# Patient Record
Sex: Female | Born: 1985 | Race: White | Hispanic: No | Marital: Single
Health system: Southern US, Community
[De-identification: ages and names within clinical notes are randomized; demographics above are authoritative.]

## PROBLEM LIST (undated history)

## (undated) DIAGNOSIS — F410 Panic disorder [episodic paroxysmal anxiety] without agoraphobia: Secondary | ICD-10-CM

## (undated) DIAGNOSIS — F419 Anxiety disorder, unspecified: Secondary | ICD-10-CM

## (undated) DIAGNOSIS — F319 Bipolar disorder, unspecified: Secondary | ICD-10-CM

## (undated) DIAGNOSIS — J45909 Unspecified asthma, uncomplicated: Secondary | ICD-10-CM

## (undated) DIAGNOSIS — F209 Schizophrenia, unspecified: Secondary | ICD-10-CM

---
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
## 2007-12-24 ENCOUNTER — Emergency Department (HOSPITAL_COMMUNITY): Admission: EM | Admit: 2007-12-24 | Discharge: 2007-12-24 | Payer: Self-pay | Admitting: Emergency Medicine

## 2008-01-12 ENCOUNTER — Emergency Department (HOSPITAL_COMMUNITY): Admission: EM | Admit: 2008-01-12 | Discharge: 2008-01-12 | Payer: Self-pay | Admitting: Family Medicine

## 2008-02-22 ENCOUNTER — Emergency Department (HOSPITAL_COMMUNITY): Admission: EM | Admit: 2008-02-22 | Discharge: 2008-02-22 | Payer: Self-pay | Admitting: Emergency Medicine

## 2008-02-24 ENCOUNTER — Emergency Department (HOSPITAL_COMMUNITY): Admission: EM | Admit: 2008-02-24 | Discharge: 2008-02-24 | Payer: Self-pay | Admitting: Emergency Medicine

## 2008-06-26 ENCOUNTER — Emergency Department (HOSPITAL_COMMUNITY): Admission: EM | Admit: 2008-06-26 | Discharge: 2008-06-26 | Payer: Self-pay | Admitting: Emergency Medicine

## 2008-06-29 ENCOUNTER — Ambulatory Visit (HOSPITAL_COMMUNITY): Admission: RE | Admit: 2008-06-29 | Discharge: 2008-06-29 | Payer: Self-pay | Admitting: General Surgery

## 2009-05-08 ENCOUNTER — Inpatient Hospital Stay (HOSPITAL_COMMUNITY): Admission: AD | Admit: 2009-05-08 | Discharge: 2009-05-08 | Payer: Self-pay | Admitting: Obstetrics & Gynecology

## 2009-05-19 ENCOUNTER — Emergency Department (HOSPITAL_COMMUNITY): Admission: EM | Admit: 2009-05-19 | Discharge: 2009-05-19 | Payer: Self-pay | Admitting: Emergency Medicine

## 2009-07-11 ENCOUNTER — Ambulatory Visit: Payer: Self-pay | Admitting: Obstetrics & Gynecology

## 2009-07-11 ENCOUNTER — Encounter: Payer: Self-pay | Admitting: Obstetrics and Gynecology

## 2009-07-11 LAB — CONVERTED CEMR LAB: GC Probe Amp, Genital: NEGATIVE

## 2009-07-12 ENCOUNTER — Ambulatory Visit (HOSPITAL_COMMUNITY): Admission: RE | Admit: 2009-07-12 | Discharge: 2009-07-12 | Payer: Self-pay | Admitting: Obstetrics & Gynecology

## 2009-07-12 ENCOUNTER — Encounter: Payer: Self-pay | Admitting: Obstetrics and Gynecology

## 2009-07-25 ENCOUNTER — Ambulatory Visit: Payer: Self-pay | Admitting: Obstetrics & Gynecology

## 2009-08-08 ENCOUNTER — Ambulatory Visit: Payer: Self-pay | Admitting: Obstetrics and Gynecology

## 2009-08-10 ENCOUNTER — Ambulatory Visit: Payer: Self-pay | Admitting: Advanced Practice Midwife

## 2009-08-10 ENCOUNTER — Inpatient Hospital Stay (HOSPITAL_COMMUNITY): Admission: AD | Admit: 2009-08-10 | Discharge: 2009-08-12 | Payer: Self-pay | Admitting: Obstetrics & Gynecology

## 2009-09-08 ENCOUNTER — Observation Stay (HOSPITAL_COMMUNITY): Admission: EM | Admit: 2009-09-08 | Discharge: 2009-09-10 | Payer: Self-pay | Admitting: Emergency Medicine

## 2009-09-09 ENCOUNTER — Ambulatory Visit: Payer: Self-pay | Admitting: Psychiatry

## 2010-02-27 ENCOUNTER — Inpatient Hospital Stay (HOSPITAL_COMMUNITY): Admission: AD | Admit: 2010-02-27 | Discharge: 2010-02-27 | Payer: Self-pay | Admitting: Obstetrics and Gynecology

## 2010-03-13 ENCOUNTER — Emergency Department (HOSPITAL_COMMUNITY): Admission: EM | Admit: 2010-03-13 | Discharge: 2010-03-13 | Payer: Self-pay | Admitting: Emergency Medicine

## 2010-03-22 ENCOUNTER — Emergency Department (HOSPITAL_COMMUNITY): Admission: EM | Admit: 2010-03-22 | Discharge: 2010-03-22 | Payer: Self-pay | Admitting: Emergency Medicine

## 2010-04-23 ENCOUNTER — Emergency Department (HOSPITAL_COMMUNITY): Admission: EM | Admit: 2010-04-23 | Discharge: 2010-04-24 | Payer: Self-pay | Admitting: Emergency Medicine

## 2010-06-03 ENCOUNTER — Emergency Department (HOSPITAL_COMMUNITY): Admission: EM | Admit: 2010-06-03 | Discharge: 2010-06-04 | Payer: Self-pay | Admitting: Emergency Medicine

## 2010-06-24 ENCOUNTER — Emergency Department (HOSPITAL_COMMUNITY): Admission: EM | Admit: 2010-06-24 | Discharge: 2010-06-24 | Payer: Self-pay | Admitting: Emergency Medicine

## 2010-12-20 LAB — BASIC METABOLIC PANEL
BUN: 12 mg/dL (ref 6–23)
Calcium: 8.5 mg/dL (ref 8.4–10.5)
GFR calc Af Amer: >60 mL/min (ref >60)
GFR calc non Af Amer: >60 mL/min (ref >60)
Potassium: 3.2 mEq/L — ABNORMAL LOW (ref 3.5–5.1)
Sodium: 138 mEq/L (ref 135–145)

## 2010-12-20 LAB — POCT I-STAT, CHEM 8
Creatinine, Ser: 1 mg/dL (ref 0.4–1.2)
Glucose, Bld: 91 mg/dL (ref 70–99)
Hemoglobin: 14.3 g/dL (ref 12.0–15.0)
Potassium: 3.2 mEq/L — ABNORMAL LOW (ref 3.5–5.1)
Sodium: 138 mEq/L (ref 135–145)
TCO2: 24 mmol/L (ref 0–100)

## 2010-12-20 LAB — ETHANOL: Alcohol, Ethyl (B): 315 mg/dL — ABNORMAL HIGH (ref 0–10)

## 2010-12-21 LAB — DIFFERENTIAL
Basophils Relative: 0 % (ref 0–1)
Eosinophils Absolute: 0 10*3/uL (ref 0.0–0.7)
Eosinophils Relative: 0 % (ref 0–5)
Lymphs Abs: 1.7 10*3/uL (ref 0.7–4.0)
Monocytes Absolute: 0.3 10*3/uL (ref 0.1–1.0)
Neutro Abs: 4.5 10*3/uL (ref 1.7–7.7)
Neutrophils Relative %: 69 % (ref 43–77)

## 2010-12-21 LAB — BASIC METABOLIC PANEL
BUN: 11 mg/dL (ref 6–23)
Calcium: 8.2 mg/dL — ABNORMAL LOW (ref 8.4–10.5)
Creatinine, Ser: 0.66 mg/dL (ref 0.4–1.2)
Potassium: 3.8 mEq/L (ref 3.5–5.1)

## 2010-12-21 LAB — URINE MICROSCOPIC-ADD ON

## 2010-12-21 LAB — URINE CULTURE
Colony Count: NO GROWTH
Culture: NO GROWTH

## 2010-12-21 LAB — URINALYSIS, ROUTINE W REFLEX MICROSCOPIC
Bilirubin Urine: NEGATIVE
Nitrite: NEGATIVE
Urobilinogen, UA: 0.2 mg/dL (ref 0.0–1.0)

## 2010-12-21 LAB — CBC
HCT: 35.8 % — ABNORMAL LOW (ref 36.0–46.0)
MCH: 29.7 pg (ref 26.0–34.0)
MCHC: 33.7 g/dL (ref 30.0–36.0)
MCV: 88.1 fL (ref 78.0–100.0)
RBC: 4.06 MIL/uL (ref 3.87–5.11)
WBC: 6.6 10*3/uL (ref 4.0–10.5)

## 2010-12-21 LAB — RAPID URINE DRUG SCREEN, HOSP PERFORMED
Amphetamines: NOT DETECTED
Benzodiazepines: POSITIVE — AB
Cocaine: NOT DETECTED

## 2010-12-21 LAB — ETHANOL: Alcohol, Ethyl (B): 242 mg/dL — ABNORMAL HIGH (ref 0–10)

## 2010-12-23 LAB — URINALYSIS, ROUTINE W REFLEX MICROSCOPIC
Protein, ur: NEGATIVE mg/dL
Urobilinogen, UA: 0.2 mg/dL (ref 0.0–1.0)

## 2010-12-23 LAB — CBC
Hemoglobin: 11 g/dL — ABNORMAL LOW (ref 12.0–15.0)
MCHC: 34.7 g/dL (ref 30.0–36.0)
Platelets: 195 10*3/uL (ref 150–400)
RDW: 12.4 % (ref 11.5–15.5)

## 2010-12-23 LAB — WET PREP, GENITAL: Clue Cells Wet Prep HPF POC: NONE SEEN

## 2010-12-23 LAB — URINE MICROSCOPIC-ADD ON

## 2010-12-23 LAB — POCT PREGNANCY, URINE: Preg Test, Ur: NEGATIVE

## 2011-01-06 ENCOUNTER — Emergency Department (HOSPITAL_COMMUNITY)
Admission: EM | Admit: 2011-01-06 | Discharge: 2011-01-06 | Discharge status: Against Medical Advice | Payer: Self-pay | Attending: Emergency Medicine | Admitting: Emergency Medicine

## 2011-01-06 DIAGNOSIS — F1010 Alcohol abuse, uncomplicated: Secondary | ICD-10-CM | POA: Insufficient documentation

## 2011-01-07 LAB — MAGNESIUM
Magnesium: 2 mg/dL (ref 1.5–2.5)
Magnesium: 2.1 mg/dL (ref 1.5–2.5)
Magnesium: 2.6 mg/dL — ABNORMAL HIGH (ref 1.5–2.5)

## 2011-01-07 LAB — PHOSPHORUS
Phosphorus: 4 mg/dL (ref 2.3–4.6)
Phosphorus: 4.2 mg/dL (ref 2.3–4.6)

## 2011-01-07 LAB — BASIC METABOLIC PANEL
BUN: 10 mg/dL (ref 6–23)
Calcium: 8.5 mg/dL (ref 8.4–10.5)
Chloride: 106 mEq/L (ref 96–112)
Chloride: 111 mEq/L (ref 96–112)
Creatinine, Ser: 0.68 mg/dL (ref 0.4–1.2)
GFR calc Af Amer: >60 mL/min (ref >60)
GFR calc Af Amer: >60 mL/min (ref >60)
GFR calc non Af Amer: >60 mL/min (ref >60)
GFR calc non Af Amer: >60 mL/min (ref >60)
Glucose, Bld: 104 mg/dL — ABNORMAL HIGH (ref 70–99)
Glucose, Bld: 130 mg/dL — ABNORMAL HIGH (ref 70–99)
Potassium: 3.3 mEq/L — ABNORMAL LOW (ref 3.5–5.1)
Potassium: 4 mEq/L (ref 3.5–5.1)
Sodium: 137 mEq/L (ref 135–145)

## 2011-01-07 LAB — CBC
HCT: 34.1 % — ABNORMAL LOW (ref 36.0–46.0)
HCT: 39.3 % (ref 36.0–46.0)
Hemoglobin: 11.6 g/dL — ABNORMAL LOW (ref 12.0–15.0)
MCV: 93.2 fL (ref 78.0–100.0)
MCV: 94.7 fL (ref 78.0–100.0)
Platelets: 238 10*3/uL (ref 150–400)
RBC: 3.36 MIL/uL — ABNORMAL LOW (ref 3.87–5.11)
RBC: 3.59 MIL/uL — ABNORMAL LOW (ref 3.87–5.11)
RDW: 13 % (ref 11.5–15.5)
WBC: 5.1 10*3/uL (ref 4.0–10.5)
WBC: 5.9 10*3/uL (ref 4.0–10.5)

## 2011-01-07 LAB — URINALYSIS, ROUTINE W REFLEX MICROSCOPIC
Glucose, UA: NEGATIVE mg/dL
Ketones, ur: 15 mg/dL — AB
Protein, ur: 100 mg/dL — AB

## 2011-01-07 LAB — DIFFERENTIAL
Basophils Absolute: 0 10*3/uL (ref 0.0–0.1)
Eosinophils Absolute: 0.1 10*3/uL (ref 0.0–0.7)
Eosinophils Absolute: 0.2 10*3/uL (ref 0.0–0.7)
Eosinophils Relative: 2 % (ref 0–5)
Lymphocytes Relative: 41 % (ref 12–46)
Lymphs Abs: 2.4 10*3/uL (ref 0.7–4.0)
Lymphs Abs: 2.5 10*3/uL (ref 0.7–4.0)
Monocytes Relative: 7 % (ref 3–12)
Neutrophils Relative %: 48 % (ref 43–77)

## 2011-01-07 LAB — WET PREP, GENITAL

## 2011-01-07 LAB — RAPID URINE DRUG SCREEN, HOSP PERFORMED
Barbiturates: NOT DETECTED
Benzodiazepines: NOT DETECTED
Cocaine: POSITIVE — AB
Opiates: NOT DETECTED

## 2011-01-07 LAB — URINE MICROSCOPIC-ADD ON

## 2011-01-07 LAB — GC/CHLAMYDIA PROBE AMP, GENITAL: Chlamydia, DNA Probe: NEGATIVE

## 2011-01-07 LAB — RPR: RPR Ser Ql: NONREACTIVE

## 2011-01-08 LAB — CBC
HCT: 28.9 % — ABNORMAL LOW (ref 36.0–46.0)
Hemoglobin: 10 g/dL — ABNORMAL LOW (ref 12.0–15.0)
MCHC: 34.5 g/dL (ref 30.0–36.0)
RBC: 3.02 MIL/uL — ABNORMAL LOW (ref 3.87–5.11)
WBC: 8.4 10*3/uL (ref 4.0–10.5)

## 2011-01-09 ENCOUNTER — Emergency Department (HOSPITAL_COMMUNITY)
Admission: EM | Admit: 2011-01-09 | Discharge: 2011-01-09 | Payer: Self-pay | Attending: Emergency Medicine | Admitting: Emergency Medicine

## 2011-01-09 ENCOUNTER — Emergency Department (HOSPITAL_COMMUNITY): Payer: Self-pay

## 2011-01-09 DIAGNOSIS — IMO0002 Reserved for concepts with insufficient information to code with codable children: Secondary | ICD-10-CM | POA: Insufficient documentation

## 2011-01-09 DIAGNOSIS — R52 Pain, unspecified: Secondary | ICD-10-CM | POA: Insufficient documentation

## 2011-01-09 DIAGNOSIS — F1010 Alcohol abuse, uncomplicated: Secondary | ICD-10-CM | POA: Insufficient documentation

## 2011-01-09 LAB — GLUCOSE, CAPILLARY: Glucose-Capillary: 73 mg/dL (ref 70–99)

## 2011-01-09 LAB — POCT URINALYSIS DIP (DEVICE)
Bilirubin Urine: NEGATIVE
Hgb urine dipstick: NEGATIVE
Hgb urine dipstick: NEGATIVE
Ketones, ur: NEGATIVE mg/dL
Ketones, ur: NEGATIVE mg/dL
Protein, ur: NEGATIVE mg/dL
Protein, ur: NEGATIVE mg/dL
Specific Gravity, Urine: 1.01 (ref 1.005–1.030)
Specific Gravity, Urine: 1.025 (ref 1.005–1.030)
Urobilinogen, UA: 0.2 mg/dL (ref 0.0–1.0)
pH: 6.5 (ref 5.0–8.0)

## 2011-01-11 LAB — RAPID URINE DRUG SCREEN, HOSP PERFORMED
Benzodiazepines: NOT DETECTED
Cocaine: NOT DETECTED
Opiates: NOT DETECTED
Tetrahydrocannabinol: NOT DETECTED

## 2011-01-11 LAB — CBC
HCT: 33 % — ABNORMAL LOW (ref 36.0–46.0)
MCV: 95.7 fL (ref 78.0–100.0)
Platelets: 183 10*3/uL (ref 150–400)
RDW: 12.5 % (ref 11.5–15.5)
WBC: 6.9 10*3/uL (ref 4.0–10.5)

## 2011-01-11 LAB — GLUCOSE, RANDOM: Glucose, Bld: 86 mg/dL (ref 70–99)

## 2011-01-11 LAB — ABO/RH: ABO/RH(D): A POS

## 2011-01-11 LAB — URINALYSIS, ROUTINE W REFLEX MICROSCOPIC
Bilirubin Urine: NEGATIVE
Glucose, UA: NEGATIVE mg/dL
Ketones, ur: NEGATIVE mg/dL
Nitrite: NEGATIVE
Specific Gravity, Urine: 1.015 (ref 1.005–1.030)
pH: 7.5 (ref 5.0–8.0)

## 2011-01-11 LAB — DIFFERENTIAL
Basophils Absolute: 0 10*3/uL (ref 0.0–0.1)
Eosinophils Absolute: 0 10*3/uL (ref 0.0–0.7)
Eosinophils Relative: 1 % (ref 0–5)
Neutrophils Relative %: 77 % (ref 43–77)

## 2011-01-11 LAB — HEPATITIS B SURFACE ANTIGEN: Hepatitis B Surface Ag: NEGATIVE

## 2011-01-11 LAB — TYPE AND SCREEN

## 2011-01-11 LAB — D-DIMER, QUANTITATIVE: D-Dimer, Quant: 0.48 ug/mL-FEU (ref 0.00–0.48)
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
## 2011-02-28 ENCOUNTER — Emergency Department (HOSPITAL_COMMUNITY)
Admission: EM | Admit: 2011-02-28 | Discharge: 2011-02-28 | Disposition: A | Discharge status: Other Acute Inpatient Hospital | Payer: No Typology Code available for payment source | Attending: Emergency Medicine | Admitting: Emergency Medicine

## 2011-02-28 ENCOUNTER — Inpatient Hospital Stay (HOSPITAL_COMMUNITY)
Admission: AD | Admit: 2011-02-28 | Discharge: 2011-03-07 | DRG: 897 | Disposition: A | Discharge status: Home / Self Care | Payer: PRIVATE HEALTH INSURANCE | Source: Ambulatory Visit | Attending: Psychiatry | Admitting: Psychiatry

## 2011-02-28 DIAGNOSIS — M549 Dorsalgia, unspecified: Secondary | ICD-10-CM | POA: Diagnosis present

## 2011-02-28 DIAGNOSIS — B370 Candidal stomatitis: Secondary | ICD-10-CM | POA: Insufficient documentation

## 2011-02-28 DIAGNOSIS — F4310 Post-traumatic stress disorder, unspecified: Secondary | ICD-10-CM

## 2011-02-28 DIAGNOSIS — F3289 Other specified depressive episodes: Secondary | ICD-10-CM | POA: Diagnosis present

## 2011-02-28 DIAGNOSIS — F329 Major depressive disorder, single episode, unspecified: Secondary | ICD-10-CM | POA: Diagnosis present

## 2011-02-28 DIAGNOSIS — IMO0002 Reserved for concepts with insufficient information to code with codable children: Secondary | ICD-10-CM | POA: Insufficient documentation

## 2011-02-28 DIAGNOSIS — H60399 Other infective otitis externa, unspecified ear: Secondary | ICD-10-CM | POA: Diagnosis present

## 2011-02-28 DIAGNOSIS — F411 Generalized anxiety disorder: Secondary | ICD-10-CM | POA: Diagnosis present

## 2011-02-28 DIAGNOSIS — R45851 Suicidal ideations: Secondary | ICD-10-CM

## 2011-02-28 DIAGNOSIS — F1020 Alcohol dependence, uncomplicated: Secondary | ICD-10-CM | POA: Diagnosis present

## 2011-02-28 DIAGNOSIS — F1920 Other psychoactive substance dependence, uncomplicated: Principal | ICD-10-CM | POA: Diagnosis present

## 2011-02-28 DIAGNOSIS — R45850 Homicidal ideations: Secondary | ICD-10-CM

## 2011-02-28 DIAGNOSIS — Z5900 Homelessness unspecified: Secondary | ICD-10-CM

## 2011-02-28 DIAGNOSIS — F1910 Other psychoactive substance abuse, uncomplicated: Secondary | ICD-10-CM | POA: Insufficient documentation

## 2011-02-28 LAB — COMPREHENSIVE METABOLIC PANEL
Albumin: 4.2 g/dL (ref 3.5–5.2)
BUN: 8 mg/dL (ref 6–23)
Creatinine, Ser: 0.59 mg/dL (ref 0.4–1.2)
GFR calc Af Amer: >60 mL/min (ref >60)
Potassium: 3.7 mEq/L (ref 3.5–5.1)
Total Protein: 8 g/dL (ref 6.0–8.3)

## 2011-02-28 LAB — DIFFERENTIAL
Eosinophils Absolute: 0.2 10*3/uL (ref 0.0–0.7)
Eosinophils Relative: 2 % (ref 0–5)
Lymphocytes Relative: 18 % (ref 12–46)
Lymphs Abs: 1.5 10*3/uL (ref 0.7–4.0)
Monocytes Absolute: 0.6 10*3/uL (ref 0.1–1.0)
Monocytes Relative: 7 % (ref 3–12)

## 2011-02-28 LAB — URINE MICROSCOPIC-ADD ON

## 2011-02-28 LAB — RAPID URINE DRUG SCREEN, HOSP PERFORMED
Barbiturates: NOT DETECTED
Cocaine: POSITIVE — AB
Opiates: NOT DETECTED

## 2011-02-28 LAB — CBC
HCT: 40.1 % (ref 36.0–46.0)
MCH: 28.4 pg (ref 26.0–34.0)
MCV: 82.5 fL (ref 78.0–100.0)
RDW: 13.6 % (ref 11.5–15.5)
WBC: 8.7 10*3/uL (ref 4.0–10.5)

## 2011-02-28 LAB — URINALYSIS, ROUTINE W REFLEX MICROSCOPIC
Bilirubin Urine: NEGATIVE
Glucose, UA: NEGATIVE mg/dL
Protein, ur: NEGATIVE mg/dL
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
## 2011-03-01 DIAGNOSIS — F1920 Other psychoactive substance dependence, uncomplicated: Secondary | ICD-10-CM

## 2011-03-01 DIAGNOSIS — F1994 Other psychoactive substance use, unspecified with psychoactive substance-induced mood disorder: Secondary | ICD-10-CM

## 2011-03-02 LAB — RPR: RPR Ser Ql: NONREACTIVE

## 2011-03-07 DIAGNOSIS — F1920 Other psychoactive substance dependence, uncomplicated: Secondary | ICD-10-CM

## 2011-03-07 DIAGNOSIS — F1994 Other psychoactive substance use, unspecified with psychoactive substance-induced mood disorder: Secondary | ICD-10-CM
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
## 2011-03-25 NOTE — H&P (Signed)
[name], [name] NO.:  [id number]  MEDICAL RECORD NO.:  [id number]  LOCATION:  0500                          FACILITY:  [facility]  PHYSICIAN:  Eulogio Ditch, MD DATE OF BIRTH:  12/10/1985  DATE OF ADMISSION:  02/28/2011 DATE OF DISCHARGE:                      PSYCHIATRIC ADMISSION ASSESSMENT   This is a voluntary admission to the services of Dr. Rogers Blocker.  HISTORY OF PRESENT ILLNESS:  This is a 25 year old, single white female. She was seen in her room in bed.  Apparently, she presented at [facility] last night.  She reported that she needed help with detoxing from cocaine.  She also reported an infection on her tongue.  She was found to have oral candida and also an ear infection.  Her UDS was positive for cocaine, for benzodiazepines, for marijuana.  She had an alcohol level of 140.  She also had a small amount of leukocyte esterase in her urine.  She had many epithelial cells, but her urine nitrite was negative.  Unfortunately, this young lady is homeless.  PAST PSYCHIATRIC HISTORY:  She has no prior inpatient stays that we are aware of.  She is an outpatient of [facility].  When hospitalized back in 2010, she was seen in consultation by Dr. Electa Sniff and at that time, she was having what appeared to be a drug-induced psychosis.  She was found to have cocaine abuse, rule out polysubstance abuse and substance- induced psychosis.  Also at that time, she had trichomonas, urinary tract infection and low back pain.  SOCIAL HISTORY:  The patient is not willing to arouse up and be interviewed but according to the chart, she is homeless.  Other than that, I do not really know a whole lot about her.  FAMILY HISTORY:  Again, according to the chart, negative.  ALCOHOL AND DRUG HISTORY:  She has been abusing drugs as far back as at least 2010 that we have verification of.  According to her history, she has been using alcohol since age 52 and crack and marijuana  since age 84.  PRIMARY CARE PROVIDER:  None.  MEDICAL PROBLEMS:  None are diagnosed, although in the emergency room, she was found to have a thrush and otitis externa on the left.  MEDICATIONS:  From the emergency room, she was prescribed: 1. Nystatin suspension 400,000 units p.o. q.i.d. 2. Ofloxacin 0.3% otic solution 5 drops in her ears b.i.d.  She was prescribed from [facility].  This would be: 1. Prozac 40. 2. Vistaril 100 at h.s. 3. Trazodone 100 at h.s. 4. To help her with her detox from alcohol, she was prescribed Librium     50 mg p.o. daily.  DRUG ALLERGIES:  She does not have any drug allergies that we are aware of.  POSITIVE PHYSICAL FINDINGS:  As already stated, she was medically cleared in the ED at [facility].  She was afebrile, 98.3.  Her pulse was 77, respirations 20, blood pressure 130/80.  She did have the before- mentioned positive values of UDS positive for benzodiazepines, marijuana and cocaine.  She had a measurable alcohol level of 140.  She did have a small amount of leukocyte esterase, many bacteria and epithelial cells and a trace of  hemoglobin in her urine.  She had no other lab work done.  MENTAL STATUS EXAM:  She is lying in the bed.  She is asleep.  She refuses to arouse up at this point in time.  She was seen in consultation in the emergency room by Dr. Rogers Blocker.  She was calm and cooperative for him.  She had fair eye contact.  Her hygiene and grooming were fair.  She had bruises all over her body, especially the upper forearms.  Her mood was depressed.  Her affect was congruent.  Her thought processes were logical and goal-directed.  She did acknowledge feeling suicidal, and she was also hopeless and helpless.  She reported that she has nothing to live for.  She denied hearing voices.  She was not internally preoccupied.  Cognition - She was alert.  She was awake. She was oriented x3.  Her memory was intact.  Her attention and concentration  were fair.  Her abstraction ability was fair, and her insight and judgment were fair to poor.  DIAGNOSES:  Axis I:  Polysubstance dependence, depressive disorder not otherwise specified. Axis II:  Deferred. Axis III:  Thrush and otitis externa, left. Axis IV:  Chronic substance abuse, probably homeless. Axis V:  40.  PLAN:  Rule out STDs and HIV testing.  That was to be done in the ED. She was to be admitted to the [facility] for further stabilization and treatment.  Estimated length of stay is 3-5 days.     Mickie Leonarda Salon, P.A.-C.   ______________________________ Eulogio Ditch, MD    MD/MEDQ  D:  03/01/2011  T:  03/01/2011  Job:  [id number]  Electronically Signed by Jaci Lazier ADAMS P.A.-C. on 03/20/2011 07:34:06 PM Electronically Signed by Eulogio Ditch  on 03/25/2011 09:54:38 AM

## 2011-03-26 ENCOUNTER — Other Ambulatory Visit: Payer: Self-pay | Admitting: Emergency Medicine

## 2011-03-26 ENCOUNTER — Emergency Department (HOSPITAL_COMMUNITY)
Admission: EM | Admit: 2011-03-26 | Discharge: 2011-03-26 | Disposition: A | Discharge status: Home / Self Care | Payer: No Typology Code available for payment source | Attending: Emergency Medicine | Admitting: Emergency Medicine

## 2011-03-26 DIAGNOSIS — F1910 Other psychoactive substance abuse, uncomplicated: Secondary | ICD-10-CM | POA: Insufficient documentation

## 2011-03-26 DIAGNOSIS — F1010 Alcohol abuse, uncomplicated: Secondary | ICD-10-CM | POA: Insufficient documentation

## 2011-03-26 DIAGNOSIS — F411 Generalized anxiety disorder: Secondary | ICD-10-CM | POA: Insufficient documentation

## 2011-03-26 DIAGNOSIS — F29 Unspecified psychosis not due to a substance or known physiological condition: Secondary | ICD-10-CM | POA: Insufficient documentation

## 2011-03-27 LAB — COMPREHENSIVE METABOLIC PANEL
ALT: 13 U/L (ref 0–35)
AST: 18 U/L (ref 0–37)
Alkaline Phosphatase: 61 U/L (ref 39–117)
Calcium: 8.8 mg/dL (ref 8.4–10.5)
Potassium: 3.6 mEq/L (ref 3.5–5.1)
Sodium: 141 mEq/L (ref 135–145)
Total Protein: 7.7 g/dL (ref 6.0–8.3)

## 2011-03-27 LAB — DIFFERENTIAL
Eosinophils Relative: 1 % (ref 0–5)
Lymphocytes Relative: 21 % (ref 12–46)
Lymphs Abs: 1.3 10*3/uL (ref 0.7–4.0)
Monocytes Absolute: 0.4 10*3/uL (ref 0.1–1.0)
Monocytes Relative: 7 % (ref 3–12)

## 2011-03-27 LAB — ETHANOL: Alcohol, Ethyl (B): 207 mg/dL — ABNORMAL HIGH (ref 0–11)

## 2011-03-27 LAB — CBC
MCH: 29.4 pg (ref 26.0–34.0)
MCHC: 34.6 g/dL (ref 30.0–36.0)
Platelets: 188 10*3/uL (ref 150–400)
RBC: 4.63 MIL/uL (ref 3.87–5.11)

## 2011-03-27 LAB — ACETAMINOPHEN LEVEL: Acetaminophen (Tylenol), Serum: <15 ug/mL (ref 10–30)

## 2011-06-30 LAB — URINALYSIS, ROUTINE W REFLEX MICROSCOPIC
Ketones, ur: NEGATIVE
Nitrite: NEGATIVE
Specific Gravity, Urine: 1.024
pH: 7

## 2011-06-30 LAB — GC/CHLAMYDIA PROBE AMP, GENITAL: Chlamydia, DNA Probe: POSITIVE — AB

## 2011-06-30 LAB — URINE MICROSCOPIC-ADD ON

## 2011-06-30 LAB — PREGNANCY, URINE: Preg Test, Ur: NEGATIVE

## 2011-06-30 LAB — WET PREP, GENITAL: Clue Cells Wet Prep HPF POC: NONE SEEN

## 2011-06-30 LAB — RPR: RPR Ser Ql: NONREACTIVE

## 2011-07-02 LAB — POCT PREGNANCY, URINE: Preg Test, Ur: NEGATIVE

## 2011-07-07 LAB — CBC
Hemoglobin: 14.5
MCHC: 33.4
RDW: 13.5

## 2011-07-07 LAB — URINALYSIS, ROUTINE W REFLEX MICROSCOPIC
Protein, ur: 30 — AB
Urobilinogen, UA: 1

## 2011-07-07 LAB — COCAINE, URINE, CONFIRMATION: Benzoylecgonine GC/MS Conf: 2000 ng/mL

## 2011-07-07 LAB — URINE MICROSCOPIC-ADD ON

## 2011-07-07 LAB — URINE DRUGS OF ABUSE SCREEN W ALC, ROUTINE (REF LAB)
Cocaine Metabolites: POSITIVE — AB
Ethyl Alcohol: <10
Marijuana Metabolite: POSITIVE — AB
Opiate Screen, Urine: NEGATIVE
Phencyclidine (PCP): NEGATIVE

## 2011-07-07 LAB — RAPID URINE DRUG SCREEN, HOSP PERFORMED
Amphetamines: NOT DETECTED
Benzodiazepines: NOT DETECTED

## 2011-07-24 ENCOUNTER — Emergency Department (HOSPITAL_COMMUNITY)
Admission: EM | Admit: 2011-07-24 | Discharge: 2011-07-25 | Payer: No Typology Code available for payment source | Attending: Family Medicine | Admitting: Family Medicine

## 2011-07-24 DIAGNOSIS — F1010 Alcohol abuse, uncomplicated: Secondary | ICD-10-CM | POA: Insufficient documentation

## 2011-07-24 DIAGNOSIS — R4182 Altered mental status, unspecified: Secondary | ICD-10-CM | POA: Insufficient documentation

## 2011-07-24 DIAGNOSIS — H60399 Other infective otitis externa, unspecified ear: Secondary | ICD-10-CM | POA: Insufficient documentation

## 2011-07-24 DIAGNOSIS — F1910 Other psychoactive substance abuse, uncomplicated: Secondary | ICD-10-CM | POA: Insufficient documentation

## 2011-07-24 LAB — RAPID URINE DRUG SCREEN, HOSP PERFORMED
Amphetamines: NOT DETECTED
Benzodiazepines: NOT DETECTED
Cocaine: NOT DETECTED
Opiates: NOT DETECTED
Tetrahydrocannabinol: POSITIVE — AB

## 2011-07-24 LAB — URINALYSIS, ROUTINE W REFLEX MICROSCOPIC
Bilirubin Urine: NEGATIVE
Glucose, UA: NEGATIVE mg/dL
Hgb urine dipstick: NEGATIVE
Leukocytes, UA: NEGATIVE
Protein, ur: 30 mg/dL — AB
pH: 5.5 (ref 5.0–8.0)

## 2011-07-24 LAB — DIFFERENTIAL
Basophils Relative: 0 % (ref 0–1)
Eosinophils Absolute: 0 10*3/uL (ref 0.0–0.7)
Lymphs Abs: 1.6 10*3/uL (ref 0.7–4.0)
Monocytes Relative: 5 % (ref 3–12)
Neutro Abs: 8.2 10*3/uL — ABNORMAL HIGH (ref 1.7–7.7)
Neutrophils Relative %: 80 % — ABNORMAL HIGH (ref 43–77)

## 2011-07-24 LAB — COMPREHENSIVE METABOLIC PANEL
ALT: 22 U/L (ref 0–35)
AST: 24 U/L (ref 0–37)
Albumin: 4.6 g/dL (ref 3.5–5.2)
Alkaline Phosphatase: 85 U/L (ref 39–117)
CO2: 23 mEq/L (ref 19–32)
Chloride: 104 mEq/L (ref 96–112)
Creatinine, Ser: 0.65 mg/dL (ref 0.50–1.10)
GFR calc non Af Amer: >90 mL/min (ref >90)
Potassium: 3.7 mEq/L (ref 3.5–5.1)
Total Bilirubin: 0.2 mg/dL — ABNORMAL LOW (ref 0.3–1.2)

## 2011-07-24 LAB — CBC
Hemoglobin: 13.6 g/dL (ref 12.0–15.0)
MCH: 28.8 pg (ref 26.0–34.0)
Platelets: 239 10*3/uL (ref 150–400)
RBC: 4.73 MIL/uL (ref 3.87–5.11)
WBC: 10.3 10*3/uL (ref 4.0–10.5)

## 2011-07-24 LAB — URINE MICROSCOPIC-ADD ON

## 2011-07-24 LAB — ETHANOL: Alcohol, Ethyl (B): 211 mg/dL — ABNORMAL HIGH (ref 0–11)

## 2012-01-04 ENCOUNTER — Emergency Department: Payer: Self-pay | Admitting: Emergency Medicine

## 2013-01-02 ENCOUNTER — Encounter (HOSPITAL_COMMUNITY): Payer: Self-pay | Admitting: Emergency Medicine

## 2013-01-02 ENCOUNTER — Emergency Department (HOSPITAL_COMMUNITY)
Admission: EM | Admit: 2013-01-02 | Discharge: 2013-01-02 | Disposition: A | Discharge status: Home / Self Care | Payer: No Typology Code available for payment source | Attending: Emergency Medicine | Admitting: Emergency Medicine

## 2013-01-02 DIAGNOSIS — F1010 Alcohol abuse, uncomplicated: Secondary | ICD-10-CM | POA: Insufficient documentation

## 2013-01-02 DIAGNOSIS — F10929 Alcohol use, unspecified with intoxication, unspecified: Secondary | ICD-10-CM

## 2013-01-02 MED ORDER — AMMONIA AROMATIC IN INHA
RESPIRATORY_TRACT | Status: AC
  Filled 2013-01-02: qty 10
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
## 2013-01-02 NOTE — ED Notes (Signed)
MD Bonk notified of patient and of [employer] requesting evaluation, states that patient needs to be moved to room for assessment and that he would evaluate for extent of work up at that time.
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
## 2013-05-10 ENCOUNTER — Emergency Department (HOSPITAL_COMMUNITY)
Admission: EM | Admit: 2013-05-10 | Discharge: 2013-05-11 | Disposition: A | Discharge status: Home / Self Care | Payer: No Typology Code available for payment source | Attending: Emergency Medicine | Admitting: Emergency Medicine

## 2013-05-10 DIAGNOSIS — Z5900 Homelessness unspecified: Secondary | ICD-10-CM | POA: Insufficient documentation

## 2013-05-10 DIAGNOSIS — Z3202 Encounter for pregnancy test, result negative: Secondary | ICD-10-CM | POA: Insufficient documentation

## 2013-05-10 DIAGNOSIS — R296 Repeated falls: Secondary | ICD-10-CM | POA: Insufficient documentation

## 2013-05-10 DIAGNOSIS — F1910 Other psychoactive substance abuse, uncomplicated: Secondary | ICD-10-CM

## 2013-05-10 DIAGNOSIS — F1010 Alcohol abuse, uncomplicated: Secondary | ICD-10-CM

## 2013-05-10 DIAGNOSIS — F10939 Alcohol use, unspecified with withdrawal, unspecified: Secondary | ICD-10-CM | POA: Diagnosis present

## 2013-05-10 DIAGNOSIS — F1410 Cocaine abuse, uncomplicated: Secondary | ICD-10-CM | POA: Insufficient documentation

## 2013-05-10 DIAGNOSIS — Y9389 Activity, other specified: Secondary | ICD-10-CM | POA: Insufficient documentation

## 2013-05-10 DIAGNOSIS — S300XXA Contusion of lower back and pelvis, initial encounter: Secondary | ICD-10-CM

## 2013-05-10 DIAGNOSIS — F1210 Cannabis abuse, uncomplicated: Secondary | ICD-10-CM | POA: Insufficient documentation

## 2013-05-10 DIAGNOSIS — F10239 Alcohol dependence with withdrawal, unspecified: Secondary | ICD-10-CM | POA: Diagnosis present

## 2013-05-10 DIAGNOSIS — Y929 Unspecified place or not applicable: Secondary | ICD-10-CM | POA: Insufficient documentation

## 2013-05-10 MED ORDER — ONDANSETRON HCL 4 MG PO TABS: 4.0000 mg | ORAL_TABLET | Freq: Three times a day (TID) | ORAL | Status: DC | PRN

## 2013-05-10 MED ORDER — IBUPROFEN 200 MG PO TABS
600.0000 mg | ORAL_TABLET | Freq: Three times a day (TID) | ORAL | Status: DC | PRN
  Administered 2013-05-11: 600 mg via ORAL
  Filled 2013-05-10: qty 3

## 2013-05-10 MED ORDER — ACETAMINOPHEN 325 MG PO TABS: 650.0000 mg | ORAL_TABLET | ORAL | Status: DC | PRN

## 2013-05-10 MED ORDER — THIAMINE HCL 100 MG/ML IJ SOLN: 100.0000 mg | Freq: Every day | INTRAMUSCULAR | Status: DC

## 2013-05-10 MED ORDER — NICOTINE 21 MG/24HR TD PT24
21.0000 mg | MEDICATED_PATCH | Freq: Every day | TRANSDERMAL | Status: DC
  Administered 2013-05-11: 21 mg via TRANSDERMAL
  Filled 2013-05-10: qty 1

## 2013-05-10 MED ORDER — LORAZEPAM 1 MG PO TABS
0.0000 mg | ORAL_TABLET | Freq: Four times a day (QID) | ORAL | Status: DC
  Administered 2013-05-11: 1 mg via ORAL
  Filled 2013-05-10: qty 1

## 2013-05-10 MED ORDER — ZOLPIDEM TARTRATE 5 MG PO TABS: 5.0000 mg | ORAL_TABLET | Freq: Every evening | ORAL | Status: DC | PRN

## 2013-05-10 MED ORDER — ALUM & MAG HYDROXIDE-SIMETH 200-200-20 MG/5ML PO SUSP: 30.0000 mL | ORAL | Status: DC | PRN

## 2013-05-10 MED ORDER — LORAZEPAM 1 MG PO TABS: 1.0000 mg | ORAL_TABLET | Freq: Three times a day (TID) | ORAL | Status: DC | PRN

## 2013-05-10 MED ORDER — LORAZEPAM 1 MG PO TABS: 0.0000 mg | ORAL_TABLET | Freq: Two times a day (BID) | ORAL | Status: DC

## 2013-05-10 MED ORDER — VITAMIN B-1 100 MG PO TABS
100.0000 mg | ORAL_TABLET | Freq: Every day | ORAL | Status: DC
  Administered 2013-05-11: 100 mg via ORAL
  Filled 2013-05-10: qty 1
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
## 2013-05-10 NOTE — ED Notes (Signed)
Pt has shirt, shoes, socks, pants, bra, and underwear.  Placed in locker 31.

## 2013-05-10 NOTE — ED Notes (Signed)
Writer gave pt a ham sandwich and caffeine free coke.

## 2013-05-11 ENCOUNTER — Encounter (HOSPITAL_COMMUNITY): Payer: Self-pay | Admitting: *Deleted

## 2013-05-11 DIAGNOSIS — F10239 Alcohol dependence with withdrawal, unspecified: Secondary | ICD-10-CM | POA: Diagnosis present

## 2013-05-11 DIAGNOSIS — F10939 Alcohol use, unspecified with withdrawal, unspecified: Secondary | ICD-10-CM | POA: Diagnosis present

## 2013-05-11 DIAGNOSIS — F1910 Other psychoactive substance abuse, uncomplicated: Secondary | ICD-10-CM | POA: Diagnosis present

## 2013-05-11 LAB — CBC
MCH: 30.5 pg (ref 26.0–34.0)
MCHC: 35 g/dL (ref 30.0–36.0)
MCV: 87.1 fL (ref 78.0–100.0)
Platelets: 233 10*3/uL (ref 150–400)
RBC: 4.82 MIL/uL (ref 3.87–5.11)

## 2013-05-11 LAB — COMPREHENSIVE METABOLIC PANEL
AST: 71 U/L — ABNORMAL HIGH (ref 0–37)
CO2: 22 mEq/L (ref 19–32)
Calcium: 9 mg/dL (ref 8.4–10.5)
Creatinine, Ser: 0.6 mg/dL (ref 0.50–1.10)
GFR calc Af Amer: >90 mL/min (ref >90)
GFR calc non Af Amer: >90 mL/min (ref >90)
Sodium: 138 mEq/L (ref 135–145)
Total Protein: 7.9 g/dL (ref 6.0–8.3)

## 2013-05-11 LAB — RAPID URINE DRUG SCREEN, HOSP PERFORMED
Benzodiazepines: NOT DETECTED
Cocaine: NOT DETECTED
Opiates: NOT DETECTED
Tetrahydrocannabinol: POSITIVE — AB

## 2013-05-11 MED ORDER — CHLORDIAZEPOXIDE HCL 25 MG PO CAPS: 25.0000 mg | ORAL_CAPSULE | Freq: Four times a day (QID) | ORAL | Status: DC | PRN

## 2013-05-11 MED ORDER — LOPERAMIDE HCL 2 MG PO CAPS: 2.0000 mg | ORAL_CAPSULE | ORAL | Status: DC | PRN

## 2013-05-11 MED ORDER — CHLORDIAZEPOXIDE HCL 25 MG PO CAPS: 25.0000 mg | ORAL_CAPSULE | Freq: Three times a day (TID) | ORAL | Status: DC

## 2013-05-11 MED ORDER — LEVONORGESTREL 0.75 MG PO TABS
1.5000 mg | ORAL_TABLET | Freq: Once | ORAL | Status: AC
  Administered 2013-05-11: 1.5 mg via ORAL
  Filled 2013-05-11 (×2): qty 2

## 2013-05-11 MED ORDER — ADULT MULTIVITAMIN W/MINERALS CH
1.0000 | ORAL_TABLET | Freq: Every day | ORAL | Status: DC
  Administered 2013-05-11: 1 via ORAL
  Filled 2013-05-11: qty 1

## 2013-05-11 MED ORDER — CHLORDIAZEPOXIDE HCL 25 MG PO CAPS: 25.0000 mg | ORAL_CAPSULE | ORAL | Status: DC

## 2013-05-11 MED ORDER — THIAMINE HCL 100 MG/ML IJ SOLN
100.0000 mg | Freq: Once | INTRAMUSCULAR | Status: DC
  Filled 2013-05-11: qty 2

## 2013-05-11 MED ORDER — HYDROXYZINE HCL 25 MG PO TABS: 25.0000 mg | ORAL_TABLET | Freq: Four times a day (QID) | ORAL | Status: DC | PRN

## 2013-05-11 MED ORDER — CHLORDIAZEPOXIDE HCL 25 MG PO CAPS: 25.0000 mg | ORAL_CAPSULE | Freq: Every day | ORAL | Status: DC

## 2013-05-11 MED ORDER — ONDANSETRON 4 MG PO TBDP
4.0000 mg | ORAL_TABLET | Freq: Once | ORAL | Status: AC
  Administered 2013-05-11: 4 mg via ORAL
  Filled 2013-05-11: qty 1

## 2013-05-11 MED ORDER — CHLORDIAZEPOXIDE HCL 25 MG PO CAPS
25.0000 mg | ORAL_CAPSULE | Freq: Four times a day (QID) | ORAL | Status: DC
  Administered 2013-05-11 (×3): 25 mg via ORAL
  Filled 2013-05-11 (×3): qty 1

## 2013-05-11 MED ORDER — ONDANSETRON 4 MG PO TBDP: 4.0000 mg | ORAL_TABLET | Freq: Four times a day (QID) | ORAL | Status: DC | PRN

## 2013-05-11 MED ORDER — CHLORDIAZEPOXIDE HCL 25 MG PO CAPS: 25.0000 mg | ORAL_CAPSULE | Freq: Once | ORAL | Status: DC

## 2013-05-11 MED ORDER — VITAMIN B-1 100 MG PO TABS: 100.0000 mg | ORAL_TABLET | Freq: Every day | ORAL | Status: DC

## 2013-05-11 NOTE — ED Notes (Signed)
Pink pocket placed in locker 31

## 2013-05-11 NOTE — Consult Note (Signed)
Reason for Consult:  Evaluation for inpatient treatment Referring Physician: EDP  [name] is an 27 y.o. female.  HPI: Patient present to [facility] with complaints of wanting to detox off of alcohol and drugs.  No past medical history on file.  No past surgical history on file.  No family history on file.  Social History:  reports that  drinks alcohol. She reports that she uses illicit drugs (Marijuana). Her tobacco history is not on file.  Allergies: No Known Allergies  Medications: I have reviewed the patient's current medications.  Results for orders placed during the hospital encounter of 05/10/13 (from the past 48 hour(s))  CBC     Status: None   Collection Time    05/10/13 11:50 PM      Result Value Range   WBC 8.9  4.0 - 10.5 K/uL   RBC 4.82  3.87 - 5.11 MIL/uL   Hemoglobin 14.7  12.0 - 15.0 g/dL   HCT 54.0  98.1 - 19.1 %   MCV 87.1  78.0 - 100.0 fL   MCH 30.5  26.0 - 34.0 pg   MCHC 35.0  30.0 - 36.0 g/dL   RDW 47.8  29.5 - 62.1 %   Platelets 233  150 - 400 K/uL  COMPREHENSIVE METABOLIC PANEL     Status: Abnormal   Collection Time    05/10/13 11:50 PM      Result Value Range   Sodium 138  135 - 145 mEq/L   Potassium 3.6  3.5 - 5.1 mEq/L   Chloride 101  96 - 112 mEq/L   CO2 22  19 - 32 mEq/L   Glucose, Bld 111 (*) 70 - 99 mg/dL   BUN 8  6 - 23 mg/dL   Creatinine, Ser 3.08  0.50 - 1.10 mg/dL   Calcium 9.0  8.4 - 65.7 mg/dL   Total Protein 7.9  6.0 - 8.3 g/dL   Albumin 4.2  3.5 - 5.2 g/dL   AST 71 (*) 0 - 37 U/L   ALT 112 (*) 0 - 35 U/L   Alkaline Phosphatase 70  39 - 117 U/L   Total Bilirubin 0.2 (*) 0.3 - 1.2 mg/dL   GFR calc non Af Amer >90  >90 mL/min   GFR calc Af Amer >90  >90 mL/min   Comment:            The eGFR has been calculated     using the CKD EPI equation.     This calculation has not been     validated in all clinical     situations.     eGFR's persistently     <90 mL/min signify     possible Chronic Kidney Disease.  ETHANOL      Status: Abnormal   Collection Time    05/10/13 11:50 PM      Result Value Range   Alcohol, Ethyl (B) 184 (*) 0 - 11 mg/dL   Comment:            LOWEST DETECTABLE LIMIT FOR     SERUM ALCOHOL IS 11 mg/dL     FOR MEDICAL PURPOSES ONLY  URINE RAPID DRUG SCREEN (HOSP PERFORMED)     Status: Abnormal   Collection Time    05/11/13 12:16 AM      Result Value Range   Opiates NONE DETECTED  NONE DETECTED   Cocaine NONE DETECTED  NONE DETECTED   Benzodiazepines NONE DETECTED  NONE DETECTED   Amphetamines  NONE DETECTED  NONE DETECTED   Tetrahydrocannabinol POSITIVE (*) NONE DETECTED   Barbiturates NONE DETECTED  NONE DETECTED   Comment:            DRUG SCREEN FOR MEDICAL PURPOSES     ONLY.  IF CONFIRMATION IS NEEDED     FOR ANY PURPOSE, NOTIFY LAB     WITHIN 5 DAYS.                LOWEST DETECTABLE LIMITS     FOR URINE DRUG SCREEN     Drug Class       Cutoff (ng/mL)     Amphetamine      1000     Barbiturate      200     Benzodiazepine   200     Tricyclics       300     Opiates          300     Cocaine          300     THC              50  POCT PREGNANCY, URINE     Status: None   Collection Time    05/11/13 12:17 AM      Result Value Range   Preg Test, Ur NEGATIVE  NEGATIVE   Comment:            THE SENSITIVITY OF THIS     METHODOLOGY IS >24 mIU/mL    No results found.  Review of Systems  Gastrointestinal: Negative for heartburn, nausea and vomiting.  Neurological: Positive for tremors.   Blood pressure 116/76, pulse 58, temperature 98.3 F (36.8 C), temperature source Oral, resp. rate 17, height 5\' 8"  (1.727 m), weight 90.719 kg (200 lb), SpO2 98.00%. Physical Exam  Constitutional: She is oriented to person, place, and time.  HENT:  Head: Normocephalic.  Neck: Normal range of motion.  Respiratory: Effort normal.  Musculoskeletal: Normal range of motion.  Neurological: She is alert and oriented to person, place, and time.  Skin: Skin is warm and dry.  Psychiatric: Her  speech is normal and behavior is normal. She is not withdrawn and not actively hallucinating. Thought content is not paranoid and not delusional. Cognition and memory are normal. She exhibits a depressed mood. She expresses no homicidal and no suicidal ideation.    Assessment/Plan: Axis I: Polysubstances abuse and Alcohol withdrawal Recommendation:  Inpatient long term program.  Refer to [facility] or [facility] first if not accepted, patient can go to [address] [facility].   Start librium protocol.    Rankin, Shuvon,FNP-BC 05/11/2013, 2:11 PM    I agreed with the findings, treatment and disposition plan of this patient. [name], MD

## 2013-05-11 NOTE — ED Notes (Signed)
Pt transferred from main ed, presents for alcohol detox. Pt reports she binge drinks and drinks every day.  Admits to marijuana use also.  Denies SI or HI,no AV hallucinations.  Pt not forthcoming with information. Pt calm & resting.
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
## 2013-05-11 NOTE — ED Provider Notes (Signed)
CSN: [id number]     Arrival date & time 05/10/13  2253 History     First MD Initiated Contact with Patient 05/10/13 2304     Chief Complaint  Patient presents with  . Medical Clearance   (Consider location/radiation/quality/duration/timing/severity/associated sxs/prior Treatment) HPI 27 yo female presents to the ER for help with alcoholism.  Pt reports she also abuses drugs:crack cocaine, marijuana.  She reports drinking about a case a day since she was 27 years old.  No prior detox program.  Pt reports shaking and anger if she tries to go without drinking. No prior h/o DTS or withdrawal sz.  Pt also reports passive SI, says "things would be better if I was not around".  She is hoping that God will take her, does not have plan for suicide.  Pt reports past h/o anxiety, depression, was rx vistaril and prozac which she does not take.    Pt reports she is homeless.  She lost a large amount of personal items today, and when she tried to get them back by hopping on top of the hood of the car, she landed on her right buttock, reports scrape and pain to the area.  No past medical history on file. No past surgical history on file. No family history on file. History  Substance Use Topics  . Smoking status: Not on file  . Smokeless tobacco: Not on file  . Alcohol Use: Not on file   OB History   Grav Para Term Preterm Abortions TAB SAB Ect Mult Living                 Review of Systems  All other systems reviewed and are negative.    Allergies  Review of patient's allergies indicates no known allergies.  Home Medications   Current Outpatient Rx  Name  Route  Sig  Dispense  Refill  . hydrOXYzine (ATARAX/VISTARIL) 25 MG tablet   Oral   Take 25 mg by mouth 3 (three) times daily as needed for anxiety.          BP 128/70  Pulse 88  Temp(Src) 98.8 F (37.1 C) (Oral)  Resp 16  Ht 5\' 8"  (1.727 m)  Wt 200 lb (90.719 kg)  BMI 30.42 kg/m2  SpO2 96% Physical Exam  Nursing note and  vitals reviewed. Constitutional: She is oriented to person, place, and time. She appears well-developed and well-nourished.  HENT:  Head: Normocephalic and atraumatic.  Nose: Nose normal.  Mouth/Throat: Oropharynx is clear and moist.  Eyes: Conjunctivae and EOM are normal. Pupils are equal, round, and reactive to light.  Neck: Normal range of motion. Neck supple. No JVD present. No tracheal deviation present. No thyromegaly present.  Cardiovascular: Normal rate, regular rhythm, normal heart sounds and intact distal pulses.  Exam reveals no gallop and no friction rub.   No murmur heard. Pulmonary/Chest: Effort normal and breath sounds normal. No stridor. No respiratory distress. She has no wheezes. She has no rales. She exhibits no tenderness.  Abdominal: Soft. Bowel sounds are normal. She exhibits no distension and no mass. There is no tenderness. There is no rebound and no guarding.  Musculoskeletal: Normal range of motion. She exhibits tenderness (abrasion and contusion to right buttock). She exhibits no edema.  Lymphadenopathy:    She has no cervical adenopathy.  Neurological: She is alert and oriented to person, place, and time. She has normal reflexes. She exhibits normal muscle tone. Coordination normal.  Skin: Skin is dry. No rash noted.  No erythema. No pallor.  Psychiatric: Her behavior is normal. Thought content normal.  Sad, flat affect    ED Course   Procedures (including critical care time)  Labs Reviewed  COMPREHENSIVE METABOLIC PANEL - Abnormal; Notable for the following:    Glucose, Bld 111 (*)    AST 71 (*)    ALT 112 (*)    Total Bilirubin 0.2 (*)    All other components within normal limits  ETHANOL - Abnormal; Notable for the following:    Alcohol, Ethyl (B) 184 (*)    All other components within normal limits  URINE RAPID DRUG SCREEN (HOSP PERFORMED) - Abnormal; Notable for the following:    Tetrahydrocannabinol POSITIVE (*)    All other components within  normal limits  CBC  POCT PREGNANCY, URINE   No results found. 1. Alcohol abuse   2. Contusion of buttock, initial encounter   3. Polysubstance abuse     MDM  27 yo female requesting alcohol detox, has h/o polysubstance abuse, homelessness, depression/anxiety and has new right buttock contusion.  Pt is medically clear.  Holding orders written.  Aurther Loft with [facility] aware of patient.   Olivia Mackie, MD 05/11/13 [phone]

## 2013-05-11 NOTE — Progress Notes (Signed)
[facility] provided patient with a [facility] Orange Card application, highlighting [facility].  °

## 2013-05-11 NOTE — ED Provider Notes (Signed)
3:05 PM Accepted to [facility], D/C papers provided and will be transferred.  Audree Camel, MD 05/11/13 [phone]

## 2013-05-26 ENCOUNTER — Emergency Department (HOSPITAL_COMMUNITY)
Admission: EM | Admit: 2013-05-26 | Discharge: 2013-05-26 | Disposition: A | Discharge status: Home / Self Care | Payer: No Typology Code available for payment source | Attending: Emergency Medicine | Admitting: Emergency Medicine

## 2013-05-26 ENCOUNTER — Emergency Department (HOSPITAL_COMMUNITY): Payer: No Typology Code available for payment source

## 2013-05-26 ENCOUNTER — Encounter (HOSPITAL_COMMUNITY): Payer: Self-pay | Admitting: Emergency Medicine

## 2013-05-26 DIAGNOSIS — S0100XA Unspecified open wound of scalp, initial encounter: Secondary | ICD-10-CM | POA: Insufficient documentation

## 2013-05-26 DIAGNOSIS — F17200 Nicotine dependence, unspecified, uncomplicated: Secondary | ICD-10-CM | POA: Insufficient documentation

## 2013-05-26 DIAGNOSIS — F10929 Alcohol use, unspecified with intoxication, unspecified: Secondary | ICD-10-CM

## 2013-05-26 DIAGNOSIS — S0101XA Laceration without foreign body of scalp, initial encounter: Secondary | ICD-10-CM

## 2013-05-26 DIAGNOSIS — F1010 Alcohol abuse, uncomplicated: Secondary | ICD-10-CM | POA: Insufficient documentation

## 2013-05-26 DIAGNOSIS — X789XXA Intentional self-harm by unspecified sharp object, initial encounter: Secondary | ICD-10-CM | POA: Insufficient documentation

## 2013-05-26 DIAGNOSIS — Z23 Encounter for immunization: Secondary | ICD-10-CM | POA: Insufficient documentation

## 2013-05-26 MED ORDER — TETANUS-DIPHTH-ACELL PERTUSSIS 5-2.5-18.5 LF-MCG/0.5 IM SUSP
0.5000 mL | Freq: Once | INTRAMUSCULAR | Status: AC
  Administered 2013-05-26: 0.5 mL via INTRAMUSCULAR
  Filled 2013-05-26: qty 0.5

## 2013-05-26 MED ORDER — IBUPROFEN 800 MG PO TABS: 800.0000 mg | ORAL_TABLET | Freq: Three times a day (TID) | ORAL | Status: DC

## 2013-05-26 NOTE — ED Notes (Signed)
Bed: NG29 Expected date: 05/26/13 Expected time: 2:27 AM Means of arrival: Ambulance Comments: Self inflicted lacerations/gpd
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
## 2013-10-11 ENCOUNTER — Emergency Department (HOSPITAL_COMMUNITY)
Admission: EM | Admit: 2013-10-11 | Discharge: 2013-10-11 | Disposition: A | Discharge status: Home / Self Care | Payer: Self-pay | Attending: Emergency Medicine | Admitting: Emergency Medicine

## 2013-10-11 ENCOUNTER — Emergency Department (HOSPITAL_COMMUNITY): Payer: PRIVATE HEALTH INSURANCE

## 2013-10-11 ENCOUNTER — Emergency Department (HOSPITAL_COMMUNITY): Payer: Self-pay

## 2013-10-11 DIAGNOSIS — IMO0002 Reserved for concepts with insufficient information to code with codable children: Secondary | ICD-10-CM | POA: Insufficient documentation

## 2013-10-11 DIAGNOSIS — F10929 Alcohol use, unspecified with intoxication, unspecified: Secondary | ICD-10-CM

## 2013-10-11 DIAGNOSIS — S20229A Contusion of unspecified back wall of thorax, initial encounter: Secondary | ICD-10-CM | POA: Insufficient documentation

## 2013-10-11 DIAGNOSIS — S2020XA Contusion of thorax, unspecified, initial encounter: Secondary | ICD-10-CM

## 2013-10-11 DIAGNOSIS — Z3202 Encounter for pregnancy test, result negative: Secondary | ICD-10-CM | POA: Insufficient documentation

## 2013-10-11 DIAGNOSIS — S098XXA Other specified injuries of head, initial encounter: Secondary | ICD-10-CM

## 2013-10-11 DIAGNOSIS — S0003XA Contusion of scalp, initial encounter: Secondary | ICD-10-CM | POA: Insufficient documentation

## 2013-10-11 DIAGNOSIS — S0990XA Unspecified injury of head, initial encounter: Secondary | ICD-10-CM | POA: Insufficient documentation

## 2013-10-11 DIAGNOSIS — S1093XA Contusion of unspecified part of neck, initial encounter: Secondary | ICD-10-CM

## 2013-10-11 DIAGNOSIS — F17200 Nicotine dependence, unspecified, uncomplicated: Secondary | ICD-10-CM | POA: Insufficient documentation

## 2013-10-11 DIAGNOSIS — S0083XA Contusion of other part of head, initial encounter: Secondary | ICD-10-CM

## 2013-10-11 DIAGNOSIS — F1010 Alcohol abuse, uncomplicated: Secondary | ICD-10-CM | POA: Insufficient documentation

## 2013-10-11 DIAGNOSIS — Z23 Encounter for immunization: Secondary | ICD-10-CM | POA: Insufficient documentation

## 2013-10-11 DIAGNOSIS — F411 Generalized anxiety disorder: Secondary | ICD-10-CM | POA: Insufficient documentation

## 2013-10-11 LAB — RAPID URINE DRUG SCREEN, HOSP PERFORMED
AMPHETAMINES: NOT DETECTED
BARBITURATES: NOT DETECTED
BENZODIAZEPINES: NOT DETECTED
Cocaine: NOT DETECTED
Opiates: POSITIVE — AB
Tetrahydrocannabinol: POSITIVE — AB

## 2013-10-11 LAB — CBC WITH DIFFERENTIAL/PLATELET
Basophils Absolute: 0 10*3/uL (ref 0.0–0.1)
Basophils Relative: 0 % (ref 0–1)
EOS ABS: 0.1 10*3/uL (ref 0.0–0.7)
Eosinophils Relative: 1 % (ref 0–5)
HCT: 45.9 % (ref 36.0–46.0)
HEMOGLOBIN: 16.3 g/dL — AB (ref 12.0–15.0)
LYMPHS ABS: 2.8 10*3/uL (ref 0.7–4.0)
Lymphocytes Relative: 24 % (ref 12–46)
MCH: 32.4 pg (ref 26.0–34.0)
MCHC: 35.5 g/dL (ref 30.0–36.0)
MCV: 91.3 fL (ref 78.0–100.0)
MONOS PCT: 6 % (ref 3–12)
Monocytes Absolute: 0.8 10*3/uL (ref 0.1–1.0)
NEUTROS ABS: 8.1 10*3/uL — AB (ref 1.7–7.7)
NEUTROS PCT: 68 % (ref 43–77)
Platelets: 236 10*3/uL (ref 150–400)
RBC: 5.03 MIL/uL (ref 3.87–5.11)
RDW: 13.1 % (ref 11.5–15.5)
WBC: 11.8 10*3/uL — ABNORMAL HIGH (ref 4.0–10.5)

## 2013-10-11 LAB — COMPREHENSIVE METABOLIC PANEL
ALK PHOS: 78 U/L (ref 39–117)
ALT: 25 U/L (ref 0–35)
AST: 30 U/L (ref 0–37)
Albumin: 4.2 g/dL (ref 3.5–5.2)
BILIRUBIN TOTAL: 0.3 mg/dL (ref 0.3–1.2)
BUN: 7 mg/dL (ref 6–23)
CHLORIDE: 102 meq/L (ref 96–112)
CO2: 20 mEq/L (ref 19–32)
Calcium: 8.6 mg/dL (ref 8.4–10.5)
Creatinine, Ser: 0.52 mg/dL (ref 0.50–1.10)
GLUCOSE: 87 mg/dL (ref 70–99)
POTASSIUM: 3.9 meq/L (ref 3.7–5.3)
Sodium: 142 mEq/L (ref 137–147)
Total Protein: 8.4 g/dL — ABNORMAL HIGH (ref 6.0–8.3)

## 2013-10-11 LAB — PREGNANCY, URINE: Preg Test, Ur: NEGATIVE

## 2013-10-11 LAB — URINALYSIS, ROUTINE W REFLEX MICROSCOPIC
BILIRUBIN URINE: NEGATIVE
Glucose, UA: NEGATIVE mg/dL
HGB URINE DIPSTICK: NEGATIVE
Ketones, ur: NEGATIVE mg/dL
Leukocytes, UA: NEGATIVE
Nitrite: NEGATIVE
PROTEIN: NEGATIVE mg/dL
Specific Gravity, Urine: 1.02 (ref 1.005–1.030)
UROBILINOGEN UA: 0.2 mg/dL (ref 0.0–1.0)
pH: 5 (ref 5.0–8.0)

## 2013-10-11 LAB — PROTIME-INR
INR: 0.95 (ref 0.00–1.49)
PROTHROMBIN TIME: 12.5 s (ref 11.6–15.2)

## 2013-10-11 LAB — APTT: APTT: 28 s (ref 24–37)

## 2013-10-11 LAB — ETHANOL: Alcohol, Ethyl (B): 256 mg/dL — ABNORMAL HIGH (ref 0–11)

## 2013-10-11 MED ORDER — HYDROCODONE-ACETAMINOPHEN 5-325 MG PO TABS: 1.0000 | ORAL_TABLET | ORAL | Status: DC | PRN

## 2013-10-11 MED ORDER — SODIUM CHLORIDE 0.9 % IV BOLUS (SEPSIS)
1000.0000 mL | Freq: Once | INTRAVENOUS | Status: AC
  Administered 2013-10-11: 1000 mL via INTRAVENOUS

## 2013-10-11 MED ORDER — IBUPROFEN 400 MG PO TABS
600.0000 mg | ORAL_TABLET | Freq: Once | ORAL | Status: AC
  Administered 2013-10-11: 600 mg via ORAL
  Filled 2013-10-11 (×2): qty 1

## 2013-10-11 MED ORDER — HYDROCODONE-ACETAMINOPHEN 5-325 MG PO TABS
1.0000 | ORAL_TABLET | Freq: Once | ORAL | Status: AC
  Administered 2013-10-11: 1 via ORAL
  Filled 2013-10-11: qty 1

## 2013-10-11 MED ORDER — KETOROLAC TROMETHAMINE 30 MG/ML IJ SOLN
30.0000 mg | Freq: Once | INTRAMUSCULAR | Status: AC
  Administered 2013-10-11: 30 mg via INTRAVENOUS
  Filled 2013-10-11: qty 1

## 2013-10-11 MED ORDER — ONDANSETRON HCL 4 MG/2ML IJ SOLN
4.0000 mg | Freq: Once | INTRAMUSCULAR | Status: AC
  Administered 2013-10-11: 4 mg via INTRAVENOUS
  Filled 2013-10-11: qty 2

## 2013-10-11 MED ORDER — MORPHINE SULFATE 4 MG/ML IJ SOLN
4.0000 mg | Freq: Once | INTRAMUSCULAR | Status: AC
  Administered 2013-10-11: 4 mg via INTRAVENOUS
  Filled 2013-10-11: qty 1

## 2013-10-11 MED ORDER — TETANUS-DIPHTH-ACELL PERTUSSIS 5-2.5-18.5 LF-MCG/0.5 IM SUSP
0.5000 mL | Freq: Once | INTRAMUSCULAR | Status: AC
  Administered 2013-10-11: 0.5 mL via INTRAMUSCULAR
  Filled 2013-10-11: qty 0.5

## 2013-10-11 MED ORDER — IBUPROFEN 600 MG PO TABS: 600.0000 mg | ORAL_TABLET | Freq: Four times a day (QID) | ORAL | Status: DC | PRN
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
## 2013-10-11 NOTE — ED Notes (Signed)
Pt going with SANE RN to take pictures. Is discharged from ED. Pt is a x 4. Given Malawiturkey sandwich.
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
## 2013-10-11 NOTE — ED Notes (Signed)
SANE RN is at bedside.
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
## 2013-10-11 NOTE — ED Notes (Signed)
Dr. Given ginger ale and blanket. Sheets straightened out. Pt is sitting on side of bed waiting for SANE RN.
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
## 2013-10-11 NOTE — ED Notes (Signed)
Dr. Loretha StaplerWofford requesting social work to come talk with patient about obtaining resources.
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
## 2013-10-11 NOTE — ED Notes (Signed)
Lunch tray ordered for pt.

## 2013-10-11 NOTE — Discharge Instructions (Signed)
Concussion, Adult °A concussion, or closed-head injury, is a brain injury caused by a direct blow to the head or by a quick and sudden movement (jolt) of the head or neck. Concussions are usually not life-threatening. Even so, the effects of a concussion can be serious. If you have had a concussion before, you are more likely to experience concussion-like symptoms after a direct blow to the head.  °CAUSES  °· Direct blow to the head, such as from running into another player during a soccer game, being hit in a fight, or hitting your head on a hard surface. °· A jolt of the head or neck that causes the brain to move back and forth inside the skull, such as in a car crash. °SIGNS AND SYMPTOMS  °The signs of a concussion can be hard to notice. Early on, they may be missed by you, family members, and health care providers. You may look fine but act or feel differently. °Symptoms are usually temporary, but they may last for days, weeks, or even longer. Some symptoms may appear right away while others may not show up for hours or days. Every head injury is different. Symptoms include:  °· Mild to moderate headaches that will not go away. °· A feeling of pressure inside your head.  °· Having more trouble than usual:   °· Learning or remembering things you have heard. °· Answering questions.  °· Paying attention or concentrating.   °· Organizing daily tasks.   °· Making decisions and solving problems.   °· Slowness in thinking, acting or reacting, speaking, or reading.   °· Getting lost or being easily confused.   °· Feeling tired all the time or lacking energy (fatigued).   °· Feeling drowsy.   °· Sleep disturbances.   °· Sleeping more than usual.   °· Sleeping less than usual.   °· Trouble falling asleep.   °· Trouble sleeping (insomnia).   °· Loss of balance or feeling lightheaded or dizzy.   °· Nausea or vomiting.   °· Numbness or tingling.   °· Increased sensitivity to:   °· Sounds.   °· Lights.   °· Distractions.    °· Vision problems or eyes that tire easily.   °· Diminished sense of taste or smell.   °· Ringing in the ears.   °· Mood changes such as feeling sad or anxious.   °· Becoming easily irritated or angry for little or no reason.   °· Lack of motivation. °· Seeing or hearing things other people do not see or hear (hallucinations). °DIAGNOSIS  °Your health care provider can usually diagnose a concussion based on a description of your injury and symptoms. He or she will ask whether you passed out (lost consciousness) and whether you are having trouble remembering events that happened right before and during your injury.  °Your evaluation might include:  °· A brain scan to look for signs of injury to the brain. Even if the test shows no injury, you may still have a concussion.   °· Blood tests to be sure other problems are not present. °TREATMENT  °· Concussions are usually treated in an emergency department, in urgent care, or at a clinic. You may need to stay in the hospital overnight for further treatment.   °· Tell your health care provider if you are taking any medicines, including prescription medicines, over-the-counter medicines, and natural remedies. Some medicines, such as blood thinners (anticoagulants) and aspirin, may increase the chance of complications. Also tell your health care provider whether you have had alcohol or are taking illegal drugs. This information may affect treatment. °· Your health care provider will send you   home with important instructions to follow. °· How fast you will recover from a concussion depends on many factors. These factors include how severe your concussion is, what part of your brain was injured, your age, and how healthy you were before the concussion. °· Most people with mild injuries recover fully. Recovery can take time. In general, recovery is slower in older persons. Also, persons who have had a concussion in the past or have other medical problems may find that it  takes longer to recover from their current injury. °HOME CARE INSTRUCTIONS  °General Instructions °· Carefully follow the directions your health care provider gave you. °· Only take over-the-counter or prescription medicines for pain, discomfort, or fever as directed by your health care provider. °· Take only those medicines that your health care provider has approved. °· Do not drink alcohol until your health care provider says you are well enough to do so. Alcohol and certain other drugs may slow your recovery and can put you at risk of further injury. °· If it is harder than usual to remember things, write them down. °· If you are easily distracted, try to do one thing at a time. For example, do not try to watch TV while fixing dinner. °· Talk with family members or close friends when making important decisions. °· Keep all follow-up appointments. Repeated evaluation of your symptoms is recommended for your recovery. °· Watch your symptoms and tell others to do the same. Complications sometimes occur after a concussion. Older adults with a brain injury may have a higher risk of serious complications such as of a blood clot on the brain. °· Tell your teachers, school nurse, school counselor, coach, athletic trainer, or work manager about your injury, symptoms, and restrictions. Tell them about what you can or cannot do. They should watch for:   °· Increased problems with attention or concentration.   °· Increased difficulty remembering or learning new information.   °· Increased time needed to complete tasks or assignments.   °· Increased irritability or decreased ability to cope with stress.   °· Increased symptoms.   °· Rest. Rest helps the brain to heal. Make sure you: °· Get plenty of sleep at night. Avoid staying up late at night. °· Keep the same bedtime hours on weekends and weekdays. °· Rest during the day. Take daytime naps or rest breaks when you feel tired. °· Limit activities that require a lot of  thought or concentration. These includes   °· Doing homework or job-related work.   °· Watching TV.   °· Working on the computer. °· Avoid any situation where there is potential for another head injury (football, hockey, soccer, basketball, martial arts, downhill snow sports and horseback riding). Your condition will get worse every time you experience a concussion. You should avoid these activities until you are evaluated by the appropriate follow-up caregivers. °Returning To Your Regular Activities °You will need to return to your normal activities slowly, not all at once. You must give your body and brain enough time for recovery. °· Do not return to sports or other athletic activities until your health care provider tells you it is safe to do so. °· Ask your health care provider when you can drive, ride a bicycle, or operate heavy machinery. Your ability to react may be slower after a brain injury. Never do these activities if you are dizzy. °· Ask your health care provider about when you can return to work or school. °Preventing Another Concussion °It is very important to avoid another   brain injury, especially before you have recovered. In rare cases, another injury can lead to permanent brain damage, brain swelling, or death. The risk of this is greatest during the first 7 10 days after a head injury. Avoid injuries by:  °· Wearing a seat belt when riding in a car.   °· Drinking alcohol only in moderation.   °· Wearing a helmet when biking, skiing, skateboarding, skating, or doing similar activities. °· Avoiding activities that could lead to a second concussion, such as contact or recreational sports, until your health care provider says it is OK. °· Taking safety measures in your home.   °· Remove clutter and tripping hazards from floors and stairways.   °· Use grab bars in bathrooms and handrails by stairs.   °· Place non-slip mats on floors and in bathtubs.   °· Improve lighting in dim areas. °SEEK MEDICAL  CARE IF:  °· You have increased problems paying attention or concentrating.   °· You have increased difficulty remembering or learning new information.   °· You need more time to complete tasks or assignments than before.   °· You have increased irritability or decreased ability to cope with stress. °· You have more symptoms than before. °Seek medical care if you have any of the following symptoms for more than 2 weeks after your injury:  °· Lasting (chronic) headaches.   °· Dizziness or balance problems.   °· Nausea. °· Vision problems.   °· Increased sensitivity to noise or light.   °· Depression or mood swings.   °· Anxiety or irritability.   °· Memory problems.   °· Difficulty concentrating or paying attention.   °· Sleep problems.   °· Feeling tired all the time. °SEEK IMMEDIATE MEDICAL CARE IF:  °· You have severe or worsening headaches. These may be a sign of a blood clot in the brain. °· You have weakness (even if only in one hand, leg, or part of the face). °· You have numbness. °· You have decreased coordination.   °· You vomit repeatedly.  °· You have increased sleepiness. °· One pupil is larger than the other.   °· You have convulsions.   °· You have slurred speech.   °· You have increased confusion. This may be a sign of a blood clot in the brain. °· You have increased restlessness, agitation, or irritability.   °· You are unable to recognize people or places.   °· You have neck pain.   °· It is difficult to wake you up.   °· You have unusual behavior changes.   °· You lose consciousness. °MAKE SURE YOU:  °· Understand these instructions. °· Will watch your condition. °· Will get help right away if you are not doing well or get worse. °Document Released: 12/13/2003 Document Revised: 05/25/2013 Document Reviewed: 04/14/2013 °[employer]® Patient Information ©2014 [employer]. ° °Contusion °A contusion is a deep bruise. Contusions are the result of an injury that caused bleeding under the skin. The  contusion may turn blue, purple, or yellow. Minor injuries will give you a painless contusion, but more severe contusions may stay painful and swollen for a few weeks.  °CAUSES  °A contusion is usually caused by a blow, trauma, or direct force to an area of the body. °SYMPTOMS  °· Swelling and redness of the injured area. °· Bruising of the injured area. °· Tenderness and soreness of the injured area. °· Pain. °DIAGNOSIS  °The diagnosis can be made by taking a history and physical exam. An X-ray, CT scan, or MRI may be needed to determine if there were any associated injuries, such as fractures. °TREATMENT  °Specific   treatment will depend on what area of the body was injured. In general, the best treatment for a contusion is resting, icing, elevating, and applying cold compresses to the injured area. Over-the-counter medicines may also be recommended for pain control. Ask your caregiver what the best treatment is for your contusion. °HOME CARE INSTRUCTIONS  °· Put ice on the injured area. °· Put ice in a plastic bag. °· Place a towel between your skin and the bag. °· Leave the ice on for 15-20 minutes, 03-04 times a day. °· Only take over-the-counter or prescription medicines for pain, discomfort, or fever as directed by your caregiver. Your caregiver may recommend avoiding anti-inflammatory medicines (aspirin, ibuprofen, and naproxen) for 48 hours because these medicines may increase bruising. °· Rest the injured area. °· If possible, elevate the injured area to reduce swelling. °SEEK IMMEDIATE MEDICAL CARE IF:  °· You have increased bruising or swelling. °· You have pain that is getting worse. °· Your swelling or pain is not relieved with medicines. °MAKE SURE YOU:  °· Understand these instructions. °· Will watch your condition. °· Will get help right away if you are not doing well or get worse. °Document Released: 07/02/2005 Document Revised: 12/15/2011 Document Reviewed: 07/28/2011 °[employer]® Patient Information  ©2014 [employer]. ° °

## 2013-10-11 NOTE — ED Notes (Signed)
Meal tray ordered for pt. Brought pt coke with ice.

## 2013-10-11 NOTE — ED Notes (Signed)
Pt has female visitor that pt reports is her friend

## 2014-04-10 ENCOUNTER — Emergency Department (HOSPITAL_COMMUNITY)
Admission: EM | Admit: 2014-04-10 | Discharge: 2014-04-10 | Discharge status: Against Medical Advice | Payer: PRIVATE HEALTH INSURANCE | Attending: Emergency Medicine | Admitting: Emergency Medicine

## 2014-04-10 ENCOUNTER — Encounter (HOSPITAL_COMMUNITY): Payer: Self-pay | Admitting: Emergency Medicine

## 2014-04-10 DIAGNOSIS — F410 Panic disorder [episodic paroxysmal anxiety] without agoraphobia: Secondary | ICD-10-CM | POA: Insufficient documentation

## 2014-04-10 DIAGNOSIS — Z3202 Encounter for pregnancy test, result negative: Secondary | ICD-10-CM | POA: Insufficient documentation

## 2014-04-10 DIAGNOSIS — J45909 Unspecified asthma, uncomplicated: Secondary | ICD-10-CM | POA: Insufficient documentation

## 2014-04-10 DIAGNOSIS — F17200 Nicotine dependence, unspecified, uncomplicated: Secondary | ICD-10-CM | POA: Insufficient documentation

## 2014-04-10 HISTORY — DX: Anxiety disorder, unspecified: F41.9

## 2014-04-10 HISTORY — DX: Unspecified asthma, uncomplicated: J45.909

## 2014-04-10 HISTORY — DX: Panic disorder (episodic paroxysmal anxiety): F41.0

## 2014-04-10 LAB — COMPREHENSIVE METABOLIC PANEL
ALK PHOS: 72 U/L (ref 39–117)
ALT: 10 U/L (ref 0–35)
ANION GAP: 15 (ref 5–15)
AST: 13 U/L (ref 0–37)
Albumin: 4.5 g/dL (ref 3.5–5.2)
BUN: 10 mg/dL (ref 6–23)
CO2: 22 mEq/L (ref 19–32)
Calcium: 10 mg/dL (ref 8.4–10.5)
Chloride: 105 mEq/L (ref 96–112)
Creatinine, Ser: 0.68 mg/dL (ref 0.50–1.10)
GFR calc Af Amer: >90 mL/min (ref >90)
GFR calc non Af Amer: >90 mL/min (ref >90)
Glucose, Bld: 100 mg/dL — ABNORMAL HIGH (ref 70–99)
POTASSIUM: 4.1 meq/L (ref 3.7–5.3)
Sodium: 142 mEq/L (ref 137–147)
TOTAL PROTEIN: 8.4 g/dL — AB (ref 6.0–8.3)
Total Bilirubin: 0.5 mg/dL (ref 0.3–1.2)

## 2014-04-10 LAB — URINALYSIS, ROUTINE W REFLEX MICROSCOPIC
Glucose, UA: NEGATIVE mg/dL
Hgb urine dipstick: NEGATIVE
KETONES UR: NEGATIVE mg/dL
Leukocytes, UA: NEGATIVE
NITRITE: NEGATIVE
PH: 5.5 (ref 5.0–8.0)
Protein, ur: 30 mg/dL — AB
SPECIFIC GRAVITY, URINE: 1.035 — AB (ref 1.005–1.030)
Urobilinogen, UA: 1 mg/dL (ref 0.0–1.0)

## 2014-04-10 LAB — CBC WITH DIFFERENTIAL/PLATELET
BASOS PCT: 0 % (ref 0–1)
Basophils Absolute: 0 10*3/uL (ref 0.0–0.1)
EOS ABS: 0 10*3/uL (ref 0.0–0.7)
EOS PCT: 0 % (ref 0–5)
HCT: 40 % (ref 36.0–46.0)
HEMOGLOBIN: 14.1 g/dL (ref 12.0–15.0)
LYMPHS ABS: 1.3 10*3/uL (ref 0.7–4.0)
Lymphocytes Relative: 16 % (ref 12–46)
MCH: 31.2 pg (ref 26.0–34.0)
MCHC: 35.3 g/dL (ref 30.0–36.0)
MCV: 88.5 fL (ref 78.0–100.0)
MONOS PCT: 9 % (ref 3–12)
Monocytes Absolute: 0.7 10*3/uL (ref 0.1–1.0)
NEUTROS PCT: 75 % (ref 43–77)
Neutro Abs: 6.1 10*3/uL (ref 1.7–7.7)
Platelets: 255 10*3/uL (ref 150–400)
RBC: 4.52 MIL/uL (ref 3.87–5.11)
RDW: 12 % (ref 11.5–15.5)
WBC: 8.2 10*3/uL (ref 4.0–10.5)

## 2014-04-10 LAB — RAPID URINE DRUG SCREEN, HOSP PERFORMED
Amphetamines: NOT DETECTED
BENZODIAZEPINES: NOT DETECTED
Barbiturates: NOT DETECTED
Cocaine: NOT DETECTED
OPIATES: NOT DETECTED
Tetrahydrocannabinol: POSITIVE — AB

## 2014-04-10 LAB — URINE MICROSCOPIC-ADD ON

## 2014-04-10 LAB — ETHANOL

## 2014-04-10 LAB — POC URINE PREG, ED: Preg Test, Ur: NEGATIVE

## 2014-04-10 NOTE — ED Notes (Signed)
Patient left prior to being discharged.

## 2014-04-10 NOTE — Discharge Instructions (Signed)
Panic Attacks °Panic attacks are sudden, short feelings of great fear or discomfort. You may have them for no reason when you are relaxed, when you are uneasy (anxious), or when you are sleeping.  °HOME CARE °· Take all your medicines as told. °· Check with your doctor before starting new medicines. °· Keep all doctor visits. °GET HELP IF: °· You are not able to take your medicines as told. °· Your symptoms do not get better. °· Your symptoms get worse. °GET HELP RIGHT AWAY IF: °· Your attacks seem different than your normal attacks. °· You have thoughts about hurting yourself or others. °· You take panic attack medicine and you have a side effect. °MAKE SURE YOU: °· Understand these instructions. °· Will watch your condition. °· Will get help right away if you are not doing well or get worse. °Document Released: 10/25/2010 Document Revised: 07/13/2013 Document Reviewed: 05/06/2013 °[employer]® Patient Information ©2015 [employer]. This information is not intended to replace advice given to you by your health care provider. Make sure you discuss any questions you have with your health care provider. ° °

## 2014-04-10 NOTE — ED Notes (Signed)
Pt brought in by [facility], nurse from shelter called due to pt having anxiety and panic attacks. Pt was talking to self in triage. Pt denies SI/HI.

## 2014-04-10 NOTE — ED Provider Notes (Signed)
CSN: [id number]     Arrival date & time 04/10/14  1529 History   First MD Initiated Contact with Patient 04/10/14 1541     Chief Complaint  Patient presents with  . Anxiety  . Panic Attack     (Consider location/radiation/quality/duration/timing/severity/associated sxs/prior Treatment) HPI Comments: Patient brought to the ER from a shelter for severe anxiety. Patient reports a history of anxiety and panic attacks. She is not currently on any medication for her anxiety. She reports that she has been having increased episodes of anxiety recently and today had a very severe panic attack. Symptoms are improving at time of my evaluation, however. Patient denies depression. She specifically denied homicidality and suicidality.  Patient is concerned about possible pregnancy. She feels like she missed a period.  Patient is a 28 y.o. female presenting with anxiety.  Anxiety    Past Medical History  Diagnosis Date  . Asthma   . Anxiety   . Panic attacks    History reviewed. No pertinent past surgical history. No family history on file. History  Substance Use Topics  . Smoking status: Current Every Day Smoker  . Smokeless tobacco: Not on file  . Alcohol Use: Yes   OB History   Grav Para Term Preterm Abortions TAB SAB Ect Mult Living                 Review of Systems  Psychiatric/Behavioral: The patient is nervous/anxious.   All other systems reviewed and are negative.     Allergies  Review of patient's allergies indicates no known allergies.  Home Medications   Prior to Admission medications   Medication Sig Start Date End Date Taking? Authorizing Provider  ibuprofen (ADVIL,MOTRIN) 200 MG tablet Take 400-600 mg by mouth every 6 (six) hours as needed for mild pain or moderate pain.   Yes Historical Provider, MD   BP 140/79  Pulse 73  Temp(Src) 98.1 F (36.7 C) (Oral)  Resp 16  SpO2 97% Physical Exam  Constitutional: She is oriented to person, place, and time. She  appears well-developed and well-nourished. No distress.  HENT:  Head: Normocephalic and atraumatic.  Right Ear: Hearing normal.  Left Ear: Hearing normal.  Nose: Nose normal.  Mouth/Throat: Oropharynx is clear and moist and mucous membranes are normal.  Eyes: Conjunctivae and EOM are normal. Pupils are equal, round, and reactive to light.  Neck: Normal range of motion. Neck supple.  Cardiovascular: Regular rhythm, S1 normal and S2 normal.  Exam reveals no gallop and no friction rub.   No murmur heard. Pulmonary/Chest: Effort normal and breath sounds normal. No respiratory distress. She exhibits no tenderness.  Abdominal: Soft. Normal appearance and bowel sounds are normal. There is no hepatosplenomegaly. There is no tenderness. There is no rebound, no guarding, no tenderness at McBurney's point and negative Murphy's sign. No hernia.  Musculoskeletal: Normal range of motion.  Neurological: She is alert and oriented to person, place, and time. She has normal strength. No cranial nerve deficit or sensory deficit. Coordination normal. GCS eye subscore is 4. GCS verbal subscore is 5. GCS motor subscore is 6.  Skin: Skin is warm, dry and intact. No rash noted. No cyanosis.  Psychiatric: Her speech is normal and behavior is normal. Thought content normal. Her mood appears anxious. Thought content is not paranoid and not delusional. She expresses no homicidal and no suicidal ideation.    ED Course  Procedures (including critical care time) Labs Review Labs Reviewed  COMPREHENSIVE METABOLIC PANEL - Abnormal;  Notable for the following:    Glucose, Bld 100 (*)    Total Protein 8.4 (*)    All other components within normal limits  URINE RAPID DRUG SCREEN (HOSP PERFORMED) - Abnormal; Notable for the following:    Tetrahydrocannabinol POSITIVE (*)    All other components within normal limits  URINALYSIS, ROUTINE W REFLEX MICROSCOPIC - Abnormal; Notable for the following:    Color, Urine AMBER (*)     APPearance CLOUDY (*)    Specific Gravity, Urine 1.035 (*)    Bilirubin Urine SMALL (*)    Protein, ur 30 (*)    All other components within normal limits  URINE MICROSCOPIC-ADD ON - Abnormal; Notable for the following:    Bacteria, UA MANY (*)    All other components within normal limits  CBC WITH DIFFERENTIAL  ETHANOL  POC URINE PREG, ED    Imaging Review No results found.   EKG Interpretation None      MDM   Final diagnoses:  None   anxiety, panic attack  Patient had a benign examination. She is not homicidal or suicidal. She reports recurrent anxiety problems, but did not appear to be particularly anxious by the time I evaluated her. She admitted that her panic attack had resolved. She was concerned about the possibility of pregnancy. Urine pregnancy was negative. Workup was otherwise unremarkable. Went back to the room to tell her the results of her pregnancy test and discharge her, she was no longer in the room.    Gilda Creasehristopher J. Uel Davidow, MD 04/10/14 [phone]

## 2014-04-11 ENCOUNTER — Emergency Department (HOSPITAL_COMMUNITY)
Admission: EM | Admit: 2014-04-11 | Discharge: 2014-04-12 | Disposition: A | Discharge status: Home / Self Care | Attending: Emergency Medicine | Admitting: Emergency Medicine

## 2014-04-11 ENCOUNTER — Encounter (HOSPITAL_COMMUNITY): Payer: Self-pay | Admitting: Emergency Medicine

## 2014-04-11 ENCOUNTER — Emergency Department (HOSPITAL_COMMUNITY)
Admission: EM | Admit: 2014-04-11 | Discharge: 2014-04-11 | Disposition: A | Discharge status: Home / Self Care | Payer: PRIVATE HEALTH INSURANCE

## 2014-04-11 ENCOUNTER — Emergency Department (HOSPITAL_COMMUNITY)

## 2014-04-11 ENCOUNTER — Encounter (HOSPITAL_COMMUNITY): Payer: Self-pay | Admitting: Anesthesiology

## 2014-04-11 ENCOUNTER — Encounter (HOSPITAL_COMMUNITY): Admission: EM | Disposition: A | Discharge status: Home / Self Care | Payer: Self-pay | Source: Home / Self Care

## 2014-04-11 DIAGNOSIS — S42302A Unspecified fracture of shaft of humerus, left arm, initial encounter for closed fracture: Secondary | ICD-10-CM

## 2014-04-11 DIAGNOSIS — F17200 Nicotine dependence, unspecified, uncomplicated: Secondary | ICD-10-CM | POA: Insufficient documentation

## 2014-04-11 DIAGNOSIS — Z3202 Encounter for pregnancy test, result negative: Secondary | ICD-10-CM | POA: Insufficient documentation

## 2014-04-11 DIAGNOSIS — F411 Generalized anxiety disorder: Secondary | ICD-10-CM | POA: Insufficient documentation

## 2014-04-11 DIAGNOSIS — R4182 Altered mental status, unspecified: Secondary | ICD-10-CM

## 2014-04-11 DIAGNOSIS — S42409A Unspecified fracture of lower end of unspecified humerus, initial encounter for closed fracture: Secondary | ICD-10-CM | POA: Insufficient documentation

## 2014-04-11 DIAGNOSIS — F1210 Cannabis abuse, uncomplicated: Secondary | ICD-10-CM

## 2014-04-11 DIAGNOSIS — J45909 Unspecified asthma, uncomplicated: Secondary | ICD-10-CM | POA: Insufficient documentation

## 2014-04-11 LAB — CBC WITH DIFFERENTIAL/PLATELET
BASOS PCT: 0 % (ref 0–1)
Basophils Absolute: 0 10*3/uL (ref 0.0–0.1)
Eosinophils Absolute: 0 10*3/uL (ref 0.0–0.7)
Eosinophils Relative: 0 % (ref 0–5)
HCT: 39.6 % (ref 36.0–46.0)
Hemoglobin: 13.7 g/dL (ref 12.0–15.0)
LYMPHS ABS: 1.1 10*3/uL (ref 0.7–4.0)
Lymphocytes Relative: 10 % — ABNORMAL LOW (ref 12–46)
MCH: 30.9 pg (ref 26.0–34.0)
MCHC: 34.6 g/dL (ref 30.0–36.0)
MCV: 89.2 fL (ref 78.0–100.0)
Monocytes Absolute: 0.9 10*3/uL (ref 0.1–1.0)
Monocytes Relative: 9 % (ref 3–12)
NEUTROS ABS: 8.5 10*3/uL — AB (ref 1.7–7.7)
NEUTROS PCT: 81 % — AB (ref 43–77)
PLATELETS: 234 10*3/uL (ref 150–400)
RBC: 4.44 MIL/uL (ref 3.87–5.11)
RDW: 12.1 % (ref 11.5–15.5)
WBC: 10.5 10*3/uL (ref 4.0–10.5)

## 2014-04-11 LAB — COMPREHENSIVE METABOLIC PANEL
ALBUMIN: 4.6 g/dL (ref 3.5–5.2)
ALK PHOS: 70 U/L (ref 39–117)
ALT: 12 U/L (ref 0–35)
AST: 15 U/L (ref 0–37)
Anion gap: 18 — ABNORMAL HIGH (ref 5–15)
BUN: 16 mg/dL (ref 6–23)
CHLORIDE: 103 meq/L (ref 96–112)
CO2: 19 mEq/L (ref 19–32)
Calcium: 10 mg/dL (ref 8.4–10.5)
Creatinine, Ser: 0.67 mg/dL (ref 0.50–1.10)
GFR calc Af Amer: >90 mL/min (ref >90)
GFR calc non Af Amer: >90 mL/min (ref >90)
Glucose, Bld: 95 mg/dL (ref 70–99)
POTASSIUM: 3.8 meq/L (ref 3.7–5.3)
SODIUM: 140 meq/L (ref 137–147)
Total Bilirubin: 0.6 mg/dL (ref 0.3–1.2)
Total Protein: 8.4 g/dL — ABNORMAL HIGH (ref 6.0–8.3)

## 2014-04-11 LAB — RAPID URINE DRUG SCREEN, HOSP PERFORMED
Amphetamines: NOT DETECTED
Barbiturates: NOT DETECTED
Benzodiazepines: NOT DETECTED
COCAINE: NOT DETECTED
Opiates: NOT DETECTED
Tetrahydrocannabinol: POSITIVE — AB

## 2014-04-11 LAB — ETHANOL

## 2014-04-11 LAB — URINALYSIS, ROUTINE W REFLEX MICROSCOPIC
Glucose, UA: NEGATIVE mg/dL
Hgb urine dipstick: NEGATIVE
KETONES UR: 15 mg/dL — AB
Leukocytes, UA: NEGATIVE
NITRITE: NEGATIVE
PH: 5.5 (ref 5.0–8.0)
Protein, ur: 30 mg/dL — AB
SPECIFIC GRAVITY, URINE: 1.037 — AB (ref 1.005–1.030)
Urobilinogen, UA: 1 mg/dL (ref 0.0–1.0)

## 2014-04-11 LAB — URINE MICROSCOPIC-ADD ON

## 2014-04-11 LAB — PREGNANCY, URINE: Preg Test, Ur: NEGATIVE

## 2014-04-11 LAB — TYPE AND SCREEN
ABO/RH(D): A POS
Antibody Screen: NEGATIVE

## 2014-04-11 LAB — ABO/RH: ABO/RH(D): A POS

## 2014-04-11 LAB — AMMONIA: AMMONIA: 39 umol/L (ref 11–60)

## 2014-04-11 SURGERY — OPEN REDUCTION INTERNAL FIXATION (ORIF) ELBOW/OLECRANON FRACTURE
Anesthesia: General | Laterality: Left

## 2014-04-11 MED ORDER — ONDANSETRON HCL 4 MG PO TABS: 4.0000 mg | ORAL_TABLET | Freq: Three times a day (TID) | ORAL | Status: DC | PRN

## 2014-04-11 MED ORDER — OXYCODONE-ACETAMINOPHEN 5-325 MG PO TABS
1.0000 | ORAL_TABLET | Freq: Three times a day (TID) | ORAL | Status: DC | PRN
  Administered 2014-04-12: 2 via ORAL
  Filled 2014-04-11: qty 2

## 2014-04-11 MED ORDER — IBUPROFEN 200 MG PO TABS
600.0000 mg | ORAL_TABLET | Freq: Three times a day (TID) | ORAL | Status: DC | PRN
  Administered 2014-04-12: 600 mg via ORAL
  Filled 2014-04-11: qty 3

## 2014-04-11 MED ORDER — NICOTINE 21 MG/24HR TD PT24: 21.0000 mg | MEDICATED_PATCH | Freq: Every day | TRANSDERMAL | Status: DC

## 2014-04-11 MED ORDER — ALUM & MAG HYDROXIDE-SIMETH 200-200-20 MG/5ML PO SUSP: 30.0000 mL | ORAL | Status: DC | PRN

## 2014-04-11 MED ORDER — LORAZEPAM 2 MG/ML IJ SOLN
2.0000 mg | Freq: Once | INTRAMUSCULAR | Status: AC
  Administered 2014-04-11: 2 mg via INTRAMUSCULAR
  Filled 2014-04-11: qty 1

## 2014-04-11 MED ORDER — HALOPERIDOL LACTATE 5 MG/ML IJ SOLN
2.0000 mg | Freq: Once | INTRAMUSCULAR | Status: DC
  Filled 2014-04-11: qty 1

## 2014-04-11 MED ORDER — DIPHENHYDRAMINE HCL 50 MG/ML IJ SOLN
25.0000 mg | Freq: Once | INTRAMUSCULAR | Status: DC
  Filled 2014-04-11: qty 1

## 2014-04-11 MED ORDER — OXYCODONE-ACETAMINOPHEN 5-325 MG PO TABS
2.0000 | ORAL_TABLET | Freq: Once | ORAL | Status: AC
  Administered 2014-04-11: 2 via ORAL
  Filled 2014-04-11: qty 2

## 2014-04-11 SURGICAL SUPPLY — 43 items
BANDAGE ELASTIC 3 VELCRO ST LF (GAUZE/BANDAGES/DRESSINGS) ×3 IMPLANT
BANDAGE ELASTIC 4 VELCRO ST LF (GAUZE/BANDAGES/DRESSINGS) ×3 IMPLANT
BANDAGE GAUZE ELAST BULKY 4 IN (GAUZE/BANDAGES/DRESSINGS) ×9 IMPLANT
BNDG COHESIVE 4X5 TAN STRL (GAUZE/BANDAGES/DRESSINGS) ×3 IMPLANT
BNDG ESMARK 4X9 LF (GAUZE/BANDAGES/DRESSINGS) ×3 IMPLANT
CORDS BIPOLAR (ELECTRODE) ×3 IMPLANT
COVER MAYO STAND STRL (DRAPES) ×3 IMPLANT
COVER SURGICAL LIGHT HANDLE (MISCELLANEOUS) ×3 IMPLANT
CUFF TOURNIQUET SINGLE 18IN (TOURNIQUET CUFF) ×3 IMPLANT
CUFF TOURNIQUET SINGLE 24IN (TOURNIQUET CUFF) IMPLANT
DRAPE INCISE IOBAN 66X45 STRL (DRAPES) ×3 IMPLANT
DRAPE OEC MINIVIEW 54X84 (DRAPES) IMPLANT
GAUZE XEROFORM 1X8 LF (GAUZE/BANDAGES/DRESSINGS) ×3 IMPLANT
GLOVE BIOGEL M STRL SZ7.5 (GLOVE) ×3 IMPLANT
GLOVE SS BIOGEL STRL SZ 8 (GLOVE) ×1 IMPLANT
GLOVE SUPERSENSE BIOGEL SZ 8 (GLOVE) ×2
GOWN STRL REUS W/ TWL LRG LVL3 (GOWN DISPOSABLE) ×2 IMPLANT
GOWN STRL REUS W/ TWL XL LVL3 (GOWN DISPOSABLE) ×3 IMPLANT
GOWN STRL REUS W/TWL LRG LVL3 (GOWN DISPOSABLE) ×4
GOWN STRL REUS W/TWL XL LVL3 (GOWN DISPOSABLE) ×6
KIT BASIN OR (CUSTOM PROCEDURE TRAY) ×3 IMPLANT
KIT ROOM TURNOVER OR (KITS) ×3 IMPLANT
LOOP VESSEL MAXI BLUE (MISCELLANEOUS) IMPLANT
MANIFOLD NEPTUNE II (INSTRUMENTS) ×3 IMPLANT
NEEDLE HYPO 25GX1X1/2 BEV (NEEDLE) IMPLANT
NS IRRIG 1000ML POUR BTL (IV SOLUTION) ×3 IMPLANT
PACK ORTHO EXTREMITY (CUSTOM PROCEDURE TRAY) ×3 IMPLANT
PAD ARMBOARD 7.5X6 YLW CONV (MISCELLANEOUS) ×6 IMPLANT
PAD CAST 4YDX4 CTTN HI CHSV (CAST SUPPLIES) ×2 IMPLANT
PADDING CAST COTTON 4X4 STRL (CAST SUPPLIES) ×4
SOLUTION BETADINE 4OZ (MISCELLANEOUS) ×3 IMPLANT
SPECIMEN JAR SMALL (MISCELLANEOUS) ×3 IMPLANT
SPONGE GAUZE 4X4 12PLY (GAUZE/BANDAGES/DRESSINGS) ×3 IMPLANT
SPONGE SCRUB IODOPHOR (GAUZE/BANDAGES/DRESSINGS) ×3 IMPLANT
SUT PROLENE 3 0 PS 2 (SUTURE) IMPLANT
SUT VIC AB 3-0 FS2 27 (SUTURE) ×3 IMPLANT
SYR CONTROL 10ML LL (SYRINGE) IMPLANT
TOWEL OR 17X24 6PK STRL BLUE (TOWEL DISPOSABLE) ×3 IMPLANT
TOWEL OR 17X26 10 PK STRL BLUE (TOWEL DISPOSABLE) ×6 IMPLANT
TUBE CONNECTING 12'X1/4 (SUCTIONS)
TUBE CONNECTING 12X1/4 (SUCTIONS) IMPLANT
UNDERPAD 30X30 INCONTINENT (UNDERPADS AND DIAPERS) ×3 IMPLANT
WATER STERILE IRR 1000ML POUR (IV SOLUTION) ×3 IMPLANT

## 2014-04-11 NOTE — ED Notes (Signed)
Pt is resting.  Pt is calm and quiet at this time.  Will hold haldol and benadryl for now.

## 2014-04-11 NOTE — Consult Note (Signed)
Reason for Consult: Left distal humerus fracture displaced Referring Physician: ER staff physician Dr. Katherine Basset is an 28 y.o. female.  HPI: 28 year old female with a difficult history. The patient was at [facility] long emergency room last night she was evaluated and subsequently discharge. She was then picked up by the [place] it appears and taken to jail. She reappeared with disorientation and an injury to her elbow. As the patient is unable to converse in an intelligent manner it is quite difficult to get any meaningful history from her. She does not remember the year she does not read where she is and is not read with a month. I have counseled her at length in regards to who I am and why she is here but nevertheless this is not helping.  I have discussed with police that I have significant concerns in regards to her mentation.  They tell me that she is a frequent inmate/arrested person and oftentimes has been known to struggle with substance abuse et Ronney Asters she is been in the hands of the authorities numerous times with some degree of mental disorientation. Nevertheless as I do not have baseline I have significant concerns and voiced his to Dr. Leonides Schanz.  The patient cannot tell me if she was using any type of mind altering substances/drugs/other forms of hallucinogenic medicine.  The reason for my consult is her left arm. She has a swollen left arm without compartment syndrome infection or dystrophic reaction. She can move her hand. She actually displaced less pain than I would expect with her fracture pattern which is a distal third humerus fracture with an intact radial nerve at this juncture it appears.  She denies neck back chest or abdominal pain.  She appears a bit him kept with multiple eschars about her feet and is somewhat foul-smelling.  Past Medical History  Diagnosis Date  . Asthma   . Anxiety   . Panic attacks     History reviewed. No pertinent  past surgical history.  No family history on file.  Social History:  reports that she has been smoking.  She does not have any smokeless tobacco history on file. She reports that she drinks alcohol. She reports that she uses illicit drugs (Marijuana).  Allergies: No Known Allergies  Medications: I have reviewed the patient's current medications.  Results for orders placed during the hospital encounter of 04/11/14 (from the past 48 hour(s))  CBC WITH DIFFERENTIAL     Status: Abnormal   Collection Time    04/11/14  1:59 PM      Result Value Ref Range   WBC 10.5  4.0 - 10.5 K/uL   RBC 4.44  3.87 - 5.11 MIL/uL   Hemoglobin 13.7  12.0 - 15.0 g/dL   HCT 39.6  36.0 - 46.0 %   MCV 89.2  78.0 - 100.0 fL   MCH 30.9  26.0 - 34.0 pg   MCHC 34.6  30.0 - 36.0 g/dL   RDW 12.1  11.5 - 15.5 %   Platelets 234  150 - 400 K/uL   Neutrophils Relative % 81 (*) 43 - 77 %   Neutro Abs 8.5 (*) 1.7 - 7.7 K/uL   Lymphocytes Relative 10 (*) 12 - 46 %   Lymphs Abs 1.1  0.7 - 4.0 K/uL   Monocytes Relative 9  3 - 12 %   Monocytes Absolute 0.9  0.1 - 1.0 K/uL   Eosinophils Relative 0  0 - 5 %  Eosinophils Absolute 0.0  0.0 - 0.7 K/uL   Basophils Relative 0  0 - 1 %   Basophils Absolute 0.0  0.0 - 0.1 K/uL  COMPREHENSIVE METABOLIC PANEL     Status: Abnormal   Collection Time    04/11/14  1:59 PM      Result Value Ref Range   Sodium 140  137 - 147 mEq/L   Potassium 3.8  3.7 - 5.3 mEq/L   Chloride 103  96 - 112 mEq/L   CO2 19  19 - 32 mEq/L   Glucose, Bld 95  70 - 99 mg/dL   BUN 16  6 - 23 mg/dL   Creatinine, Ser 0.67  0.50 - 1.10 mg/dL   Calcium 10.0  8.4 - 10.5 mg/dL   Total Protein 8.4 (*) 6.0 - 8.3 g/dL   Albumin 4.6  3.5 - 5.2 g/dL   AST 15  0 - 37 U/L   ALT 12  0 - 35 U/L   Alkaline Phosphatase 70  39 - 117 U/L   Total Bilirubin 0.6  0.3 - 1.2 mg/dL   GFR calc non Af Amer >90  >90 mL/min   GFR calc Af Amer >90  >90 mL/min   Comment: (NOTE)     The eGFR has been calculated using the CKD EPI  equation.     This calculation has not been validated in all clinical situations.     eGFR's persistently <90 mL/min signify possible Chronic Kidney     Disease.   Anion gap 18 (*) 5 - 15  TYPE AND SCREEN     Status: None   Collection Time    04/11/14  1:59 PM      Result Value Ref Range   ABO/RH(D) A POS     Antibody Screen NEG     Sample Expiration 04/14/2014    ETHANOL     Status: None   Collection Time    04/11/14  1:59 PM      Result Value Ref Range   Alcohol, Ethyl (B) <11  0 - 11 mg/dL   Comment:            LOWEST DETECTABLE LIMIT FOR     SERUM ALCOHOL IS 11 mg/dL     FOR MEDICAL PURPOSES ONLY  AMMONIA     Status: None   Collection Time    04/11/14  1:59 PM      Result Value Ref Range   Ammonia 39  11 - 60 umol/L  ABO/RH     Status: None   Collection Time    04/11/14  1:59 PM      Result Value Ref Range   ABO/RH(D) A POS      Ct Head Wo Contrast  04/11/2014   CLINICAL DATA:  Confusion, trauma.  EXAM: CT HEAD WITHOUT CONTRAST  TECHNIQUE: Contiguous axial images were obtained from the base of the skull through the vertex without intravenous contrast.  COMPARISON:  CT of the head October 11, 2013  FINDINGS: The ventricles and sulci are normal. No intraparenchymal hemorrhage, mass effect nor midline shift. No acute large vascular territory infarcts.  No abnormal extra-axial fluid collections. 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 Basal cisterns are patent. Cerebellar tonsils are at but not below the foramen magnum.  Scattered subcentimeter scalp calcifications, unchanged, without radiopaque foreign bodies. No skull fracture. The included ocular globes and orbital contents are non-suspicious. Paranasal sinus mucosal thickening without air-fluid levels.  Mastoid air cells are well aerated.  IMPRESSION: No acute intracranial process.   Electronically Signed   By: Elon Alas   On: 04/11/2014 14:42   Dg Humerus Left  04/11/2014   CLINICAL DATA:   Injury, distal humeral pain  EXAM: LEFT HUMERUS - 2+ VIEW  COMPARISON:  None.  FINDINGS: Oblique fracture of the distal left humeral diaphysis with apex posterior angulation. No glenohumeral dislocation or fracture. Normal joint.  IMPRESSION: Oblique, angulated fracture of the distal left humeral diaphysis.   Electronically Signed   By: Kathreen Devoid   On: 04/11/2014 12:26    Review of Systems  Constitutional: Negative for fever, chills and weight loss.  HENT: Negative.   Eyes: Positive for photophobia. Negative for blurred vision and double vision.  Gastrointestinal: Negative for heartburn, nausea, vomiting and abdominal pain.  Neurological: Negative for dizziness, tingling, tremors and focal weakness.  Endo/Heme/Allergies: Negative for environmental allergies. Does not bruise/bleed easily.  Psychiatric/Behavioral: Positive for hallucinations and memory loss. The patient is nervous/anxious.    Blood pressure 146/79, pulse 88, temperature 98.2 F (36.8 C), temperature source Oral, resp. rate 18, SpO2 99.00%. Physical Exam white female who is unclear. As mentioned she is not able to tell me the month year or anything about her current surroundings.  Her right upper extremity is intact to neurovascular exam without signs of infection compartment syndrome or dystrophy.  Her lower extremity examination similarly is normal. She is ambulatory she is no obvious palpable deformity about her lower extremities. Her skin is unkept unfortunately. She has multiple eschars etc. but no evidence of infection.  Her left upper extremity has a normal radial pulse. She has intact sensation to the thumb middle ring and small finger and index finger without signs of compartment syndrome. She can flex and extend her fingers. She asked he has less pain than I would expect.  She has swelling of the distal third of the humerus secondary to a fracture about the distal third humerus. There is no compartment syndrome no  infection no open lesion abrasion etc. shoulders fairly stable. I reviewed these issues with her at length and findings.  Her abdomen is nontender nondistended she slightly obese. Chest is clear neck is nontender at present time. Her back is nontender. The patient does not have any evidence of an obvious infection    Assessment/Plan: #1 distal third left humerus fracture closed without signs of compartment syndrome extra-articular #2 mental status change  I discussed with ER staff all issues. I feel she is a very poor candidate for surgical intervention do to her mentation and the poor ability for her to perform any adequate followup care. My biggest fear is that she has a major operation that she poorly attends to and becomes infected or has difficulty postop complication of major proportion given her inability to follow any instruction today..  In a perfect world I would recommend ORIF however given her many challenging issues we have performed a close reduction and casting today.  Procedure I performed a closure reduction and casting today. I cleansed the skin was soap and water followed by Adaptic Neosporin and Xeroform gauze. Once this was complete stockinette Webb roll and fiberglass casting was applied without difficulty. The patient tolerated this well I performed a 3 point mold in general closed reduction without difficulty.  I then placed a sling and the patient. I discussed all issues with the emergency room department Dr. Leonides Schanz.  I will be immediately available should have problems  occur. I've like see her in the office in 5-7 days. I will follow her along while she is in the hospital.  Once again I do not feel she is in exceptionally good operative candidate giving all of the issues that surround her today.  If this changes dramatically one would reconsider the opportunity for ORIF. For now I would try a conservative algorithm of care given all these impressive issues  Paulene Floor 04/11/2014, 3:34 PM

## 2014-04-11 NOTE — ED Notes (Signed)
Patient is resting comfortably. 

## 2014-04-11 NOTE — ED Notes (Signed)
Offered food and drink, pt declined at this time.
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
## 2014-04-11 NOTE — ED Notes (Signed)
TTS at bedside. 
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
## 2014-04-11 NOTE — ED Notes (Signed)
Seizure pads applied for safety.  Pt continues to get OOB.
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
## 2014-04-11 NOTE — ED Notes (Signed)
Spoke to [name] TTS re pt's status.    Notified her that pt's UDS is resulted.

## 2014-04-11 NOTE — ED Notes (Signed)
Pt refuses to tell RN what she wants to be seen for, but she's demanding something to drink. Pt has been here three times in the last 24 hours. Pt can't give a reason to be seen in the ER, pt is escorted out of the ED by [employer]

## 2014-04-11 NOTE — ED Notes (Signed)
Dinner tray given to pt.  Pt does not want to eat at this time.
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
## 2014-04-11 NOTE — Anesthesia Preprocedure Evaluation (Deleted)
Anesthesia Evaluation  Patient identified by MRN, date of birth, ID band Patient awake    Reviewed: Allergy & Precautions, H&P , NPO status , Patient's Chart, lab work & pertinent test results, reviewed documented beta blocker date and time   Airway Mallampati: II TM Distance: >3 FB Neck ROM: full    Dental   Pulmonary asthma , Current Smoker,  breath sounds clear to auscultation        Cardiovascular negative cardio ROS  Rhythm:regular     Neuro/Psych PSYCHIATRIC DISORDERS negative neurological ROS     GI/Hepatic negative GI ROS, (+)     substance abuse  alcohol use and marijuana use,   Endo/Other  negative endocrine ROS  Renal/GU negative Renal ROS  negative genitourinary   Musculoskeletal   Abdominal   Peds  Hematology negative hematology ROS (+)   Anesthesia Other Findings See surgeon's H&P   Reproductive/Obstetrics negative OB ROS                           Anesthesia Physical Anesthesia Plan  ASA: III and emergent  Anesthesia Plan: General   Post-op Pain Management:    Induction: Intravenous, Rapid sequence and Cricoid pressure planned  Airway Management Planned: Oral ETT  Additional Equipment:   Intra-op Plan:   Post-operative Plan: Extubation in OR  Informed Consent: I have reviewed the patients History and Physical, chart, labs and discussed the procedure including the risks, benefits and alternatives for the proposed anesthesia with the patient or authorized representative who has indicated his/her understanding and acceptance.   Dental Advisory Given  Plan Discussed with: CRNA and Surgeon  Anesthesia Plan Comments:         Anesthesia Quick Evaluation
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
## 2014-04-11 NOTE — ED Notes (Signed)
[facility] bedside[facility]  [facility]

## 2014-04-11 NOTE — ED Notes (Signed)
[facility]

## 2014-04-12 ENCOUNTER — Emergency Department (HOSPITAL_COMMUNITY)

## 2014-04-12 MED ORDER — LORAZEPAM 1 MG PO TABS
1.0000 mg | ORAL_TABLET | Freq: Once | ORAL | Status: AC
  Administered 2014-04-12: 1 mg via ORAL
  Filled 2014-04-12: qty 1

## 2014-04-12 MED ORDER — TRAMADOL HCL 50 MG PO TABS: 50.0000 mg | ORAL_TABLET | Freq: Four times a day (QID) | ORAL | Status: DC | PRN

## 2014-04-12 NOTE — Consult Note (Signed)
[facility] Consult   Reason for Consult:  Bizarre behavior Referring Physician:  ER MD  [name] is an 28 y.o. female. 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 Asthma   . Anxiety   . Panic attacks    AXIS IV:  does not  give enough history AXIS V:  61-70 mild symptoms  Plan:  No evidence of imminent risk to self or others at present.    Subjective:   [name] is a 28 y.o. female patient admitted with acting strangely.  HPI:  [name] was very upset.  She is upset that she is under arrest for trespassing.  She seems confused and is irritable.  She is disheveled and wearing a bra and a pair of long pants. She said she was here because she just had a baby who was right beside her ( no baby was there).  She at one point said she felt " somebody is trying to black me out".  She got more agitated as more questions were asked and said she was not suicidal, not homicidal, was not paranoid or hearing voices.  She, when asked again, said the baby was with its father, Aaron Edelman, she thinks. "Nothing is wrong with me. Just let me go"she said.  And we were dismissed.    HPI Elements:   Location:  strange behaviors unexplained medically. Quality:  3 visits to the ER in 24 hrs. Severity:  seems confused. Timing:  no precipitants. Duration:  one day. Context:  uncertain as to what is behind these behaviors.  Past Psychiatric History: Past Medical History  Diagnosis Date  . Asthma   . Anxiety   . Panic attacks     reports that she has been smoking.  She does not have any smokeless tobacco history on file. She reports that she drinks alcohol. She reports that she uses illicit drugs (Marijuana). No family history on file. Family History Substance Abuse:  (UTA) Family Supports:   (uTA) Living Arrangements:  (cannot answer) Can pt return to current living arrangement?: Yes   Allergies:  No Known Allergies  ACT Assessment Complete:  No:   Past Psychiatric History: Diagnosis:  Adjustment disorder with mixed disturbance of conduct and emotions  Hospitalizations:  None known  Outpatient Care:  Not known  Substance Abuse Care:  none  Self-Mutilation:  none  Suicidal Attempts:  none  Homicidal Behaviors:  none   Violent Behaviors:  none   Place of Residence:  Not known Marital Status:  Not known Employed/Unemployed:  Not known  Education:  Not known Family Supports:  Not known Objective: Blood pressure 126/99, pulse 95, temperature 98.5 F (36.9 C), temperature source Axillary, resp. rate 22, SpO2 98.00%.There is no weight on file to calculate BMI. Results for orders placed during the hospital encounter of 04/11/14 (from the past 72 hour(s))  CBC WITH DIFFERENTIAL     Status: Abnormal   Collection Time    04/11/14  1:59 PM      Result Value Ref Range   WBC 10.5  4.0 - 10.5 K/uL   RBC 4.44  3.87 - 5.11 MIL/uL   Hemoglobin 13.7  12.0 - 15.0 g/dL   HCT 39.6  36.0 - 46.0 %   MCV 89.2  78.0 - 100.0 fL  MCH 30.9  26.0 - 34.0 pg   MCHC 34.6  30.0 - 36.0 g/dL   RDW 12.1  11.5 - 15.5 %   Platelets 234  150 - 400 K/uL   Neutrophils Relative % 81 (*) 43 - 77 %   Neutro Abs 8.5 (*) 1.7 - 7.7 K/uL   Lymphocytes Relative 10 (*) 12 - 46 %   Lymphs Abs 1.1  0.7 - 4.0 K/uL   Monocytes Relative 9  3 - 12 %   Monocytes Absolute 0.9  0.1 - 1.0 K/uL   Eosinophils Relative 0  0 - 5 %   Eosinophils Absolute 0.0  0.0 - 0.7 K/uL   Basophils Relative 0  0 - 1 %   Basophils Absolute 0.0  0.0 - 0.1 K/uL  COMPREHENSIVE METABOLIC PANEL     Status: Abnormal   Collection Time    04/11/14  1:59 PM      Result Value Ref Range   Sodium 140  137 - 147 mEq/L   Potassium 3.8  3.7 - 5.3 mEq/L   Chloride 103  96 - 112 mEq/L   CO2 19  19 - 32 mEq/L   Glucose, Bld 95  70 - 99 mg/dL    BUN 16  6 - 23 mg/dL   Creatinine, Ser 0.67  0.50 - 1.10 mg/dL   Calcium 10.0  8.4 - 10.5 mg/dL   Total Protein 8.4 (*) 6.0 - 8.3 g/dL   Albumin 4.6  3.5 - 5.2 g/dL   AST 15  0 - 37 U/L   ALT 12  0 - 35 U/L   Alkaline Phosphatase 70  39 - 117 U/L   Total Bilirubin 0.6  0.3 - 1.2 mg/dL   GFR calc non Af Amer >90  >90 mL/min   GFR calc Af Amer >90  >90 mL/min   Comment: (NOTE)     The eGFR has been calculated using the CKD EPI equation.     This calculation has not been validated in all clinical situations.     eGFR's persistently <90 mL/min signify possible Chronic Kidney     Disease.   Anion gap 18 (*) 5 - 15  TYPE AND SCREEN     Status: None   Collection Time    04/11/14  1:59 PM      Result Value Ref Range   ABO/RH(D) A POS     Antibody Screen NEG     Sample Expiration 04/14/2014    ETHANOL     Status: None   Collection Time    04/11/14  1:59 PM      Result Value Ref Range   Alcohol, Ethyl (B) <11  0 - 11 mg/dL   Comment:            LOWEST DETECTABLE LIMIT FOR     SERUM ALCOHOL IS 11 mg/dL     FOR MEDICAL PURPOSES ONLY  AMMONIA     Status: None   Collection Time    04/11/14  1:59 PM      Result Value Ref Range   Ammonia 39  11 - 60 umol/L  ABO/RH     Status: None   Collection Time    04/11/14  1:59 PM      Result Value Ref Range   ABO/RH(D) A POS    URINALYSIS, ROUTINE W REFLEX MICROSCOPIC     Status: Abnormal   Collection Time    04/11/14  3:46 PM  Result Value Ref Range   Color, Urine AMBER (*) YELLOW   Comment: BIOCHEMICALS MAY BE AFFECTED BY COLOR   APPearance CLOUDY (*) CLEAR   Specific Gravity, Urine 1.037 (*) 1.005 - 1.030   pH 5.5  5.0 - 8.0   Glucose, UA NEGATIVE  NEGATIVE mg/dL   Hgb urine dipstick NEGATIVE  NEGATIVE   Bilirubin Urine SMALL (*) NEGATIVE   Ketones, ur 15 (*) NEGATIVE mg/dL   Protein, ur 30 (*) NEGATIVE mg/dL   Urobilinogen, UA 1.0  0.0 - 1.0 mg/dL   Nitrite NEGATIVE  NEGATIVE   Leukocytes, UA NEGATIVE  NEGATIVE  URINE  RAPID DRUG SCREEN (HOSP PERFORMED)     Status: Abnormal   Collection Time    04/11/14  3:46 PM      Result Value Ref Range   Opiates NONE DETECTED  NONE DETECTED   Cocaine NONE DETECTED  NONE DETECTED   Benzodiazepines NONE DETECTED  NONE DETECTED   Amphetamines NONE DETECTED  NONE DETECTED   Tetrahydrocannabinol POSITIVE (*) NONE DETECTED   Barbiturates NONE DETECTED  NONE DETECTED   Comment:            DRUG SCREEN FOR MEDICAL PURPOSES     ONLY.  IF CONFIRMATION IS NEEDED     FOR ANY PURPOSE, NOTIFY LAB     WITHIN 5 DAYS.                LOWEST DETECTABLE LIMITS     FOR URINE DRUG SCREEN     Drug Class       Cutoff (ng/mL)     Amphetamine      1000     Barbiturate      200     Benzodiazepine   314     Tricyclics       970     Opiates          300     Cocaine          300     THC              50  URINE MICROSCOPIC-ADD ON     Status: Abnormal   Collection Time    04/11/14  3:46 PM      Result Value Ref Range   Squamous Epithelial / LPF FEW (*) RARE   WBC, UA 3-6  <3 WBC/hpf   RBC / HPF 0-2  <3 RBC/hpf   Bacteria, UA RARE  RARE   Urine-Other MUCOUS PRESENT    PREGNANCY, URINE     Status: None   Collection Time    04/11/14  4:31 PM      Result Value Ref Range   Preg Test, Ur NEGATIVE  NEGATIVE   Comment:            THE SENSITIVITY OF THIS     METHODOLOGY IS >20 mIU/mL.   Labs are reviewed and are pertinent for marijuana.  Current Facility-Administered Medications  Medication Dose Route Frequency Provider Last Rate Last Dose  . alum & mag hydroxide-simeth (MAALOX/MYLANTA) 200-200-20 MG/5ML suspension 30 mL  30 mL Oral PRN Kristen N Ward, DO      . diphenhydrAMINE (BENADRYL) injection 25 mg  25 mg Intramuscular Once Wandra Arthurs, MD      . haloperidol lactate (HALDOL) injection 2 mg  2 mg Intramuscular Once Wandra Arthurs, MD      . ibuprofen (ADVIL,MOTRIN) tablet 600 mg  600 mg Oral Q8H PRN Cyril Mourning  N Ward, DO   600 mg at 04/12/14 0824  . nicotine (NICODERM CQ - dosed in  mg/24 hours) patch 21 mg  21 mg Transdermal Daily Kristen N Ward, DO      . ondansetron (ZOFRAN) tablet 4 mg  4 mg Oral Q8H PRN Kristen N Ward, DO      . oxyCODONE-acetaminophen (PERCOCET/ROXICET) 5-325 MG per tablet 1-2 tablet  1-2 tablet Oral Q8H PRN Delice Bison Ward, DO   2 tablet at 04/12/14 2841   Current Outpatient Prescriptions  Medication Sig Dispense Refill  . ibuprofen (ADVIL,MOTRIN) 200 MG tablet Take 400-600 mg by mouth every 6 (six) hours as needed for mild pain or moderate pain.      . traMADol (ULTRAM) 50 MG tablet Take 1 tablet (50 mg total) by mouth every 6 (six) hours as needed.  15 tablet  0    Psychiatric Specialty Exam:     Blood pressure 126/99, pulse 95, temperature 98.5 F (36.9 C), temperature source Axillary, resp. rate 22, SpO2 98.00%.There is no weight on file to calculate BMI.  General Appearance: Disheveled  Eye Sport and exercise psychologist::  Fair  Speech:  Clear and Coherent  Volume:  Normal  Mood:  Irritable  Affect:  Labile  Thought Process:  Disorganized  Orientation:  Full (Time, Place, and Person)  Thought Content:  Negative  Suicidal Thoughts:  No  Homicidal Thoughts:  No  Memory:  Immediate;   Poor Recent;   Poor Remote;   Poor  Judgement:  Impaired  Insight:  Lacking  Psychomotor Activity:  Normal  Concentration:  Fair  Recall:  Poor  Fund of Knowledge:Poor  Language: Good  Akathisia:  Negative  Handed:  Right  AIMS (if indicated):     Assets:  Others:  not known  Sleep:      Musculoskeletal: Strength & Muscle Tone: within normal limits Gait & Station: normal Patient leans: N/A  Treatment Plan Summary: [name] is not suicidal or homicidal.  No apparent psychosis, consequently no grounds to hold her further on psychiatric grounds  Clarene Reamer 04/12/2014 12:28 PM
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
## 2014-04-12 NOTE — Consult Note (Signed)
  Review of Systems  Unable to perform ROS: mental acuity   Arm is in a cast but she is not complaining about any medical problems

## 2014-04-12 NOTE — ED Notes (Signed)
Patient will awaken briefly, start screaming and then fall back to sleep Patient in NAD Officer remains at bedside

## 2014-04-12 NOTE — ED Notes (Addendum)
MD at bedside. Gramig back in to room to speak with pt. Pt speaking clearly and coherently with MD. Aware of discharge and plan of care.

## 2014-04-12 NOTE — ED Notes (Signed)
PT REMAINS IN POLICE CUSTODY.

## 2014-04-12 NOTE — ED Provider Notes (Signed)
Patient not seen by me and I was not directly involved in the patient care. Behavioral Health/Psychiatry has evaluated the patient and, as per their note and recommendation, patient was to be discharged. Follow-up recommendations and medications determined by/prescribed by Behavioral Health/Psychiatry.  Patient diagnosed with distal humerus fracture, patient has been seen by Doctor Amanda PeaGramig here in the ER. Conservative management has been recommended. Discharge with outpatient followup.  Gilda Creasehristopher J. Kirstin Kugler, MD 04/12/14 [phone]

## 2014-04-12 NOTE — ED Notes (Signed)
MD at bedside. Dr. Amanda PeaGramig here to see pt. EDP Pollina and Gramig made aware of pt current status and meds given

## 2014-04-12 NOTE — Discharge Instructions (Signed)
Humerus Fracture, Treated with Immobilization  The humerus is the large bone in your upper arm. You have a broken (fractured) humerus. These fractures are easily diagnosed with X-rays.  TREATMENT   Simple fractures which will heal without disability are treated with simple immobilization. Immobilization means you will wear a cast, splint, or sling. You have a fracture which will do well with immobilization. The fracture will heal well simply by being held in a good position until it is stable enough to begin range of motion exercises. Do not take part in activities which would further injure your arm.   HOME CARE INSTRUCTIONS    Put ice on the injured area.   Put ice in a plastic bag.   Place a towel between your skin and the bag.   Leave the ice on for 15-20 minutes, 03-04 times a day.   If you have a cast:   Do not scratch the skin under the cast using sharp or pointed objects.   Check the skin around the cast every day. You may put lotion on any red or sore areas.   Keep your cast dry and clean.   If you have a splint:   Wear the splint as directed.   Keep your splint dry and clean.   You may loosen the elastic around the splint if your fingers become numb, tingle, or turn cold or blue.   If you have a sling:   Wear the sling as directed.   Do not put pressure on any part of your cast or splint until it is fully hardened.   Your cast or splint can be protected during bathing with a plastic bag. Do not lower the cast or splint into water.   Only take over-the-counter or prescription medicines for pain, discomfort, or fever as directed by your caregiver.   Do range of motion exercises as instructed by your caregiver.   Follow up as directed by your caregiver. This is very important in order to avoid permanent injury or disability and chronic pain.  SEEK IMMEDIATE MEDICAL CARE IF:    Your skin or nails in the injured arm turn blue or gray.   Your arm feels cold or numb.   You develop severe  pain in the injured arm.   You are having problems with the medicines you were given.  MAKE SURE YOU:    Understand these instructions.   Will watch your condition.   Will get help right away if you are not doing well or get worse.  Document Released: 12/29/2000 Document Revised: 12/15/2011 Document Reviewed: 11/06/2010  Exit[employer] Patient Information 2015 [employer]. This information is not intended to replace advice given to you by your health care provider. Make sure you discuss any questions you have with your health care provider.

## 2014-04-12 NOTE — ED Notes (Signed)
Patient refused to eat.

## 2014-04-12 NOTE — ED Notes (Signed)
Patient transported to X-ray. Xray unable to transport. Pt not cooperating. Encouraged pt to cooperate.  Xray would return

## 2014-04-12 NOTE — ED Notes (Signed)
MD at bedside. TTS AT [facility]. 

## 2014-04-12 NOTE — Progress Notes (Signed)
Patient ID: Jeanne FewJennifer D [name], female   DOB: 08/07/1986, 28 y.o.   MRN: [id number] Patient seen and examined. Patient is still not oriented. She does not know the month year or is capable of conversing.  She's been seen by the psychiatric service.  I have her needed x-rays. Her x-rays do not show any severe worsening.  As mentioned in my prior notes oftentimes these will oftentimes aligning acceptably with gravity and go on to successful healing with long-arm cast.  In the environment of a uncooperative patient I would absolutely pursue a conservative algorithm of care as I feel that surgical management could be dangerous to the patient and complicating issues could arise which would be limb threatening. I discussed these issues with nursing staff.  There were some question of surgical versus nonsurgical management. I would highly recommend a further consultant that specializes in the upper extremity to see her if there is any question as to my opinion.  Once again in an uncooperative patient who has a history of noncompliance and great difficulty be prudent route this an attempt at conservative management.  An operation of this magnitude can only be done in a cooperative capable patient. The are many patients who do well with conservative algorithm of care  She is examined today and is neurovascularly intact. Radial median and ulnar nerve function is intact without signs of compartment syndrome dystrophy or infection.  I spent greater than 30 minutes with her today and greater than hour yesterday and assisting her and going over her situation. I also spent a great deal of time on the phone with nursing staff and physicians.  I once again feel that helping her with her mental disease is the most important priority at this juncture.  She is stable.  I will continue to see her while in the emergency room facility and would recommend followup in my office in 5-7 days as previously written and  outlined in my note yesterday  Jaimin Krupka M.D.
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
## 2014-04-14 ENCOUNTER — Emergency Department (HOSPITAL_COMMUNITY)
Admission: EM | Admit: 2014-04-14 | Discharge: 2014-04-18 | Disposition: A | Discharge status: Other Acute Inpatient Hospital | Payer: Self-pay | Attending: Emergency Medicine | Admitting: Emergency Medicine

## 2014-04-14 ENCOUNTER — Encounter (HOSPITAL_COMMUNITY): Payer: Self-pay | Admitting: Emergency Medicine

## 2014-04-14 DIAGNOSIS — F410 Panic disorder [episodic paroxysmal anxiety] without agoraphobia: Secondary | ICD-10-CM | POA: Insufficient documentation

## 2014-04-14 DIAGNOSIS — F1910 Other psychoactive substance abuse, uncomplicated: Secondary | ICD-10-CM | POA: Diagnosis present

## 2014-04-14 DIAGNOSIS — F531 Puerperal psychosis: Secondary | ICD-10-CM

## 2014-04-14 DIAGNOSIS — F10939 Alcohol use, unspecified with withdrawal, unspecified: Secondary | ICD-10-CM | POA: Diagnosis present

## 2014-04-14 DIAGNOSIS — O99345 Other mental disorders complicating the puerperium: Secondary | ICD-10-CM | POA: Diagnosis present

## 2014-04-14 DIAGNOSIS — R443 Hallucinations, unspecified: Secondary | ICD-10-CM | POA: Insufficient documentation

## 2014-04-14 DIAGNOSIS — F17200 Nicotine dependence, unspecified, uncomplicated: Secondary | ICD-10-CM | POA: Insufficient documentation

## 2014-04-14 DIAGNOSIS — F29 Unspecified psychosis not due to a substance or known physiological condition: Secondary | ICD-10-CM | POA: Insufficient documentation

## 2014-04-14 DIAGNOSIS — F10239 Alcohol dependence with withdrawal, unspecified: Secondary | ICD-10-CM | POA: Diagnosis present

## 2014-04-14 DIAGNOSIS — J45909 Unspecified asthma, uncomplicated: Secondary | ICD-10-CM | POA: Insufficient documentation

## 2014-04-14 LAB — CBC WITH DIFFERENTIAL/PLATELET
BASOS PCT: 0 % (ref 0–1)
Basophils Absolute: 0 10*3/uL (ref 0.0–0.1)
Eosinophils Absolute: 0 10*3/uL (ref 0.0–0.7)
Eosinophils Relative: 0 % (ref 0–5)
HEMATOCRIT: 36.9 % (ref 36.0–46.0)
HEMOGLOBIN: 13 g/dL (ref 12.0–15.0)
LYMPHS PCT: 22 % (ref 12–46)
Lymphs Abs: 2.1 10*3/uL (ref 0.7–4.0)
MCH: 30.8 pg (ref 26.0–34.0)
MCHC: 35.2 g/dL (ref 30.0–36.0)
MCV: 87.4 fL (ref 78.0–100.0)
MONO ABS: 0.9 10*3/uL (ref 0.1–1.0)
MONOS PCT: 9 % (ref 3–12)
NEUTROS ABS: 6.5 10*3/uL (ref 1.7–7.7)
Neutrophils Relative %: 69 % (ref 43–77)
Platelets: 294 10*3/uL (ref 150–400)
RBC: 4.22 MIL/uL (ref 3.87–5.11)
RDW: 12 % (ref 11.5–15.5)
WBC: 9.5 10*3/uL (ref 4.0–10.5)

## 2014-04-14 LAB — COMPREHENSIVE METABOLIC PANEL
ALBUMIN: 4.5 g/dL (ref 3.5–5.2)
ALK PHOS: 71 U/L (ref 39–117)
ALT: 21 U/L (ref 0–35)
AST: 26 U/L (ref 0–37)
Anion gap: 20 — ABNORMAL HIGH (ref 5–15)
BILIRUBIN TOTAL: 0.8 mg/dL (ref 0.3–1.2)
BUN: 13 mg/dL (ref 6–23)
CO2: 21 meq/L (ref 19–32)
Calcium: 10 mg/dL (ref 8.4–10.5)
Chloride: 98 mEq/L (ref 96–112)
Creatinine, Ser: 0.63 mg/dL (ref 0.50–1.10)
GFR calc Af Amer: >90 mL/min (ref >90)
Glucose, Bld: 101 mg/dL — ABNORMAL HIGH (ref 70–99)
POTASSIUM: 4.1 meq/L (ref 3.7–5.3)
Sodium: 139 mEq/L (ref 137–147)
Total Protein: 8.7 g/dL — ABNORMAL HIGH (ref 6.0–8.3)

## 2014-04-14 LAB — RAPID URINE DRUG SCREEN, HOSP PERFORMED
AMPHETAMINES: NOT DETECTED
Barbiturates: NOT DETECTED
Benzodiazepines: NOT DETECTED
Cocaine: NOT DETECTED
Opiates: NOT DETECTED
TETRAHYDROCANNABINOL: POSITIVE — AB

## 2014-04-14 LAB — POC URINE PREG, ED: PREG TEST UR: NEGATIVE

## 2014-04-14 LAB — ETHANOL: Alcohol, Ethyl (B): <11 mg/dL (ref 0–11)

## 2014-04-14 MED ORDER — NICOTINE 21 MG/24HR TD PT24: 21.0000 mg | MEDICATED_PATCH | Freq: Every day | TRANSDERMAL | Status: DC

## 2014-04-14 MED ORDER — LORAZEPAM 1 MG PO TABS
1.0000 mg | ORAL_TABLET | Freq: Three times a day (TID) | ORAL | Status: DC | PRN
  Administered 2014-04-18: 1 mg via ORAL
  Filled 2014-04-14 (×3): qty 1

## 2014-04-14 MED ORDER — STERILE WATER FOR INJECTION IJ SOLN
INTRAMUSCULAR | Status: AC
  Administered 2014-04-14: 10 mL
  Filled 2014-04-14: qty 10

## 2014-04-14 MED ORDER — ZIPRASIDONE MESYLATE 20 MG IM SOLR
20.0000 mg | Freq: Once | INTRAMUSCULAR | Status: AC
  Administered 2014-04-14: 20 mg via INTRAMUSCULAR
  Filled 2014-04-14: qty 20

## 2014-04-14 MED ORDER — ACETAMINOPHEN 325 MG PO TABS: 650.0000 mg | ORAL_TABLET | ORAL | Status: DC | PRN

## 2014-04-14 MED ORDER — ONDANSETRON HCL 4 MG PO TABS: 4.0000 mg | ORAL_TABLET | Freq: Three times a day (TID) | ORAL | Status: DC | PRN
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
## 2014-04-14 NOTE — ED Notes (Addendum)
Pt came in from jail, pt was wearing red jumpsuit that belonged to the jail,  officers took it once pt was placed in scrubs, Pt does not have any belongings.

## 2014-04-15 ENCOUNTER — Encounter (HOSPITAL_COMMUNITY): Payer: Self-pay | Admitting: Psychiatry

## 2014-04-15 DIAGNOSIS — O99345 Other mental disorders complicating the puerperium: Secondary | ICD-10-CM | POA: Diagnosis present

## 2014-04-15 DIAGNOSIS — F1910 Other psychoactive substance abuse, uncomplicated: Secondary | ICD-10-CM

## 2014-04-15 DIAGNOSIS — IMO0002 Reserved for concepts with insufficient information to code with codable children: Secondary | ICD-10-CM

## 2014-04-15 DIAGNOSIS — F531 Puerperal psychosis: Secondary | ICD-10-CM

## 2014-04-15 DIAGNOSIS — F068 Other specified mental disorders due to known physiological condition: Secondary | ICD-10-CM

## 2014-04-15 MED ORDER — LORAZEPAM 2 MG/ML IJ SOLN
1.0000 mg | Freq: Once | INTRAMUSCULAR | Status: AC
  Administered 2014-04-15: 1 mg via INTRAMUSCULAR
  Filled 2014-04-15: qty 1

## 2014-04-15 MED ORDER — QUETIAPINE FUMARATE 25 MG PO TABS: 25.0000 mg | ORAL_TABLET | Freq: Two times a day (BID) | ORAL | Status: DC

## 2014-04-15 MED ORDER — OLANZAPINE 10 MG PO TBDP
10.0000 mg | ORAL_TABLET | Freq: Two times a day (BID) | ORAL | Status: DC | PRN
  Filled 2014-04-15: qty 1

## 2014-04-15 MED ORDER — QUETIAPINE FUMARATE 25 MG PO TABS
25.0000 mg | ORAL_TABLET | Freq: Two times a day (BID) | ORAL | Status: DC
  Administered 2014-04-16 – 2014-04-18 (×2): 25 mg via ORAL
  Filled 2014-04-15 (×2): qty 1

## 2014-04-15 MED ORDER — ZIPRASIDONE MESYLATE 20 MG IM SOLR
20.0000 mg | Freq: Once | INTRAMUSCULAR | Status: AC
  Administered 2014-04-15: 20 mg via INTRAMUSCULAR
  Filled 2014-04-15: qty 20

## 2014-04-15 MED ORDER — LORAZEPAM 2 MG/ML IJ SOLN
INTRAMUSCULAR | Status: AC
  Administered 2014-04-15: 1 mg via INTRAMUSCULAR
  Filled 2014-04-15: qty 1

## 2014-04-15 MED ORDER — QUETIAPINE FUMARATE 50 MG PO TABS
50.0000 mg | ORAL_TABLET | Freq: Every day | ORAL | Status: DC
  Filled 2014-04-15: qty 1

## 2014-04-15 MED ORDER — ZIPRASIDONE MESYLATE 20 MG IM SOLR: 20.0000 mg | Freq: Once | INTRAMUSCULAR | Status: DC
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
## 2014-04-15 NOTE — ED Notes (Signed)
Offered patient basin of water and other hygiene items to clean up with.  Offered to wash her back and assist with clean up.  Patient seemed agitated and refused assistance.  Did not attempt to clean herself up.  Offered patient her PRN Ativan for relaxation.  Patient refused.

## 2014-04-15 NOTE — ED Notes (Signed)
Pt refused vital signs.

## 2014-04-15 NOTE — ED Notes (Signed)
Pt is resting quietly, sitter is sitting in front of pt,  Right outside of her room  Door.

## 2014-04-15 NOTE — ED Notes (Signed)
Pt. Refused vital signs. 

## 2014-04-15 NOTE — Consult Note (Signed)
Pt was interviewed with NP. Agree with above assessment and plan.  Lorae Roig, MD 

## 2014-04-15 NOTE — ED Notes (Signed)
Pt got up and is pacing around the room. Pt was asked if she need anything she said "no."  Attempted to re-orient pt back to bed. Pt still pacing in the room.

## 2014-04-15 NOTE — Progress Notes (Signed)
Per [facility] pt has been declined by Dr. Robet Leuhotakura d/t aggressive behavior.   Tomi BambergerMariya Loriene [place] Disposition MHT

## 2014-04-15 NOTE — ED Notes (Signed)
Patient walking out of room from time to time.  Patient redirected by sitter.  Patient compliant with sitter's requests.

## 2014-04-15 NOTE — BH Assessment (Signed)
Per Joann Glover, AC at [facility], adult unit is currently at capacity. Contacted the following facilities for placement:  BED AVAILABLE, FAXED CLINICAL INFORMATION: [facility], per [facility], per Jackie [facility], per Jessica  AT CAPACITY: [facility], per [facility], per [facility], per [facility], per Heather [facility], per [facility], per [facility], per Theresa [facility], per [facility], per Rhonda [facility], per [facility], per [facility], per Kevin [facility], per Britt[facility], per Wynette  UNABLE TO CONTACT: [facility]  PT DECLINED: [facility]    [name], LPC, NCC Triage Specialist [phone]  

## 2014-04-15 NOTE — ED Notes (Signed)
Tried to initiate conversation with patient.  Patient not answering.  Alert, sitting up in bed.

## 2014-04-15 NOTE — ED Notes (Signed)
Patient remains calm but still uncooperative.  Sitter at bedside.

## 2014-04-16 ENCOUNTER — Encounter (HOSPITAL_COMMUNITY): Payer: Self-pay | Admitting: Registered Nurse

## 2014-04-16 DIAGNOSIS — F1910 Other psychoactive substance abuse, uncomplicated: Secondary | ICD-10-CM | POA: Diagnosis not present

## 2014-04-16 DIAGNOSIS — F29 Unspecified psychosis not due to a substance or known physiological condition: Secondary | ICD-10-CM | POA: Diagnosis not present

## 2014-04-16 MED ORDER — RISPERIDONE 2 MG PO TBDP
2.0000 mg | ORAL_TABLET | Freq: Once | ORAL | Status: DC
  Filled 2014-04-16: qty 1

## 2014-04-16 MED ORDER — LORAZEPAM 2 MG/ML IJ SOLN
2.0000 mg | Freq: Once | INTRAMUSCULAR | Status: AC
  Administered 2014-04-16: 2 mg via INTRAMUSCULAR
  Filled 2014-04-16: qty 1

## 2014-04-16 MED ORDER — ZIPRASIDONE MESYLATE 20 MG IM SOLR
10.0000 mg | Freq: Once | INTRAMUSCULAR | Status: AC
  Administered 2014-04-16: 10 mg via INTRAMUSCULAR
  Filled 2014-04-16: qty 20

## 2014-04-16 MED ORDER — OLANZAPINE 10 MG IM SOLR
10.0000 mg | INTRAMUSCULAR | Status: AC
  Administered 2014-04-16: 10 mg via INTRAMUSCULAR
  Filled 2014-04-16: qty 10

## 2014-04-16 MED ORDER — OLANZAPINE 10 MG IM SOLR: 10.0000 mg | Freq: Once | INTRAMUSCULAR | Status: AC | PRN

## 2014-04-16 MED ORDER — DIPHENHYDRAMINE HCL 50 MG/ML IJ SOLN
50.0000 mg | Freq: Once | INTRAMUSCULAR | Status: AC
  Administered 2014-04-16: 50 mg via INTRAMUSCULAR
  Filled 2014-04-16: qty 1

## 2014-04-16 NOTE — ED Notes (Signed)
Agreeable to medication. Offered Seroquel and after about 10 minutes of her wrestling with herself-she denies voices but is obvious they are there-she took it.

## 2014-04-16 NOTE — ED Notes (Signed)
Pt ambulatory about the hallway, will continue to monitor for safety.

## 2014-04-16 NOTE — ED Notes (Addendum)
Appears to be listening to voices-her answers are not spontaneous. Sometimes she doesn't answer at all. Wandering in the halls. At times going in other patient's rooms.

## 2014-04-16 NOTE — Progress Notes (Signed)
MHT intiated bed placement at psychiatric facilities with availability:  1)[facility]-faxed referral for postpartum unit to review 2)[facility]-no beds 3)[facility] beds [facility] beds 5)[facility]-no beds 6)[facility]-no beds 7)[facility]ed referral 8)[facility]-faxed referral 9)[facility]-faxed referral 10)[facility]-no beds  Blain PaisMichelle L Zaire Levesque, MHT/NS

## 2014-04-16 NOTE — Progress Notes (Addendum)
Follow-up calls have been placed to the following facilities:  [facility] currently [facility], will call if any d/c's occur [facility]- per Sue Lushndrea pt has been declined d/t being aggressive [facility]  No beds available [facility]- per Jess referral not reviewed [facility]- per Denny PeonErin "intake person" will not be in until [place] 7.13 and referral will not be reviewed until then.  Pt has been declined at the following: [facility]  The following facilities contacted are at capacity: [facility]- per Troy SineKinley  [facility]- per Pilar JarvisLacey  [facility]- per Lenward Chancellorlivia  [facility]- per Cassell SmilesAlicia  [facility]- [facility]  [facility]- per Elmore Guiseosemary  [facility]- [facility]  [facility]- per May[facility]  [facility]- per Juanetta Snowanny  [facility]- per Hetty BlendJill  [facility]- per [facility]  Takeira Yanes Disposition MHT

## 2014-04-16 NOTE — ED Notes (Signed)
Pt continues to be agitated and pace, will continue to monitor for safety.

## 2014-04-16 NOTE — Progress Notes (Signed)
[facility] pt has been declined by Dr. Sudie BaileyArger d/t being combative.   Jeanne BambergerMariya Kayse [name] Disposition MHT

## 2014-04-16 NOTE — ED Notes (Signed)
Pt walking in pts rooms taking items, pt redirected back to room.  NP Drenda FreezeFran notified.
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
## 2014-04-16 NOTE — ED Notes (Addendum)
Pt continues to pace the hallways, banging Left cast arm against wall and exit door.  Pt stating she does not need any meds.  NP Drenda FreezeFran notified.  Pt constantly closing bathroom doors also.

## 2014-04-16 NOTE — ED Notes (Addendum)
Pt. Refused to get her vitals taken.

## 2014-04-16 NOTE — Consult Note (Signed)
Pt was interviewed with NP. Chart reviewed. Agree with above assessment and plan.  Aran Menning, MD 

## 2014-04-17 MED ORDER — DIPHENHYDRAMINE HCL 50 MG/ML IJ SOLN
50.0000 mg | Freq: Once | INTRAMUSCULAR | Status: DC
  Filled 2014-04-17: qty 1

## 2014-04-17 MED ORDER — LORAZEPAM 2 MG/ML IJ SOLN
2.0000 mg | Freq: Once | INTRAMUSCULAR | Status: AC
  Administered 2014-04-17: 2 mg via INTRAMUSCULAR

## 2014-04-17 MED ORDER — HALOPERIDOL LACTATE 5 MG/ML IJ SOLN
5.0000 mg | Freq: Two times a day (BID) | INTRAMUSCULAR | Status: DC
  Administered 2014-04-17: 5 mg via INTRAMUSCULAR

## 2014-04-17 MED ORDER — BENZTROPINE MESYLATE 1 MG/ML IJ SOLN
1.0000 mg | Freq: Two times a day (BID) | INTRAMUSCULAR | Status: DC
  Administered 2014-04-17 (×2): 1 mg via INTRAMUSCULAR
  Filled 2014-04-17 (×3): qty 2

## 2014-04-17 MED ORDER — LORAZEPAM 2 MG/ML IJ SOLN
2.0000 mg | Freq: Four times a day (QID) | INTRAMUSCULAR | Status: DC
  Filled 2014-04-17: qty 1

## 2014-04-17 MED ORDER — HALOPERIDOL LACTATE 5 MG/ML IJ SOLN
4.0000 mg | Freq: Three times a day (TID) | INTRAMUSCULAR | Status: DC | PRN
  Filled 2014-04-17: qty 1

## 2014-04-17 NOTE — ED Notes (Signed)
Pt. Initially refused cogentin injection but agreed to take it when security and nurses explained the need for med.

## 2014-04-17 NOTE — ED Notes (Signed)
Pt was offered a breakfast tray. Pt stated she didn't need that. Pt stated she wasn't hungry.

## 2014-04-17 NOTE — ED Notes (Signed)
Patient continues to pace and is imposiible to redirect.  She is angry and uncooperative.  Some observations of responding to internal stimuli.  [employer] and security at bedside.

## 2014-04-17 NOTE — [facility] Counselor (Signed)
Shelly at [facility] - pt has been declined d/t having no beds. They aren't expecting any discharges today and don't keep referrals.  Josslyn Ciolek Paige Tulsi Crossett, LCSWA Assessment Counselor 11:58   

## 2014-04-17 NOTE — ED Notes (Signed)
[name] is pacing halls and continues to refuse offered po medications.  Her affect is flat.  Denies physical discomfort.  Snack given and she refuses to eat her room.  States "I want to go home.  I dont"t ned to be here." Redirection and support given.  Continue to offer po medications.

## 2014-04-17 NOTE — ED Notes (Signed)
Patient refusing po medications. States "I dont need anything".  In room playing with cards.

## 2014-04-17 NOTE — ED Notes (Signed)
Pt refused 1200 vitals
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
## 2014-04-18 ENCOUNTER — Inpatient Hospital Stay (HOSPITAL_COMMUNITY)
Admission: AD | Admit: 2014-04-18 | Discharge: 2014-05-02 | DRG: 885 | Disposition: A | Discharge status: Home / Self Care | Payer: No Typology Code available for payment source | Source: Intra-hospital | Attending: Psychiatry | Admitting: Psychiatry

## 2014-04-18 ENCOUNTER — Encounter (HOSPITAL_COMMUNITY): Payer: Self-pay | Admitting: *Deleted

## 2014-04-18 DIAGNOSIS — F531 Puerperal psychosis: Secondary | ICD-10-CM

## 2014-04-18 DIAGNOSIS — G4700 Insomnia, unspecified: Secondary | ICD-10-CM | POA: Diagnosis present

## 2014-04-18 DIAGNOSIS — F1910 Other psychoactive substance abuse, uncomplicated: Secondary | ICD-10-CM

## 2014-04-18 DIAGNOSIS — F29 Unspecified psychosis not due to a substance or known physiological condition: Secondary | ICD-10-CM

## 2014-04-18 DIAGNOSIS — F1210 Cannabis abuse, uncomplicated: Secondary | ICD-10-CM | POA: Diagnosis present

## 2014-04-18 DIAGNOSIS — F22 Delusional disorders: Secondary | ICD-10-CM | POA: Diagnosis present

## 2014-04-18 DIAGNOSIS — Z5987 Material hardship due to limited financial resources, not elsewhere classified: Secondary | ICD-10-CM

## 2014-04-18 DIAGNOSIS — Z598 Other problems related to housing and economic circumstances: Secondary | ICD-10-CM

## 2014-04-18 DIAGNOSIS — F410 Panic disorder [episodic paroxysmal anxiety] without agoraphobia: Secondary | ICD-10-CM | POA: Diagnosis present

## 2014-04-18 DIAGNOSIS — F3110 Bipolar disorder, current episode manic without psychotic features, unspecified: Principal | ICD-10-CM | POA: Diagnosis present

## 2014-04-18 DIAGNOSIS — F411 Generalized anxiety disorder: Secondary | ICD-10-CM | POA: Diagnosis present

## 2014-04-18 DIAGNOSIS — O99345 Other mental disorders complicating the puerperium: Secondary | ICD-10-CM

## 2014-04-18 DIAGNOSIS — F39 Unspecified mood [affective] disorder: Secondary | ICD-10-CM | POA: Diagnosis present

## 2014-04-18 DIAGNOSIS — F17200 Nicotine dependence, unspecified, uncomplicated: Secondary | ICD-10-CM | POA: Diagnosis present

## 2014-04-18 DIAGNOSIS — J45909 Unspecified asthma, uncomplicated: Secondary | ICD-10-CM | POA: Diagnosis present

## 2014-04-18 HISTORY — DX: Bipolar disorder, unspecified: F31.9

## 2014-04-18 HISTORY — DX: Schizophrenia, unspecified: F20.9

## 2014-04-18 MED ORDER — HYDROXYZINE HCL 25 MG PO TABS
25.0000 mg | ORAL_TABLET | Freq: Four times a day (QID) | ORAL | Status: DC | PRN
  Administered 2014-04-18 – 2014-04-26 (×5): 25 mg via ORAL
  Filled 2014-04-18: qty 1
  Filled 2014-04-18: qty 30
  Filled 2014-04-18 (×4): qty 1

## 2014-04-18 MED ORDER — ONDANSETRON HCL 4 MG PO TABS: 4.0000 mg | ORAL_TABLET | Freq: Three times a day (TID) | ORAL | Status: DC | PRN

## 2014-04-18 MED ORDER — HALOPERIDOL LACTATE 5 MG/ML IJ SOLN: 5.0000 mg | Freq: Four times a day (QID) | INTRAMUSCULAR | Status: DC | PRN

## 2014-04-18 MED ORDER — ALUM & MAG HYDROXIDE-SIMETH 200-200-20 MG/5ML PO SUSP
30.0000 mL | ORAL | Status: DC | PRN
  Administered 2014-05-01 (×2): 30 mL via ORAL

## 2014-04-18 MED ORDER — TRAZODONE HCL 50 MG PO TABS
50.0000 mg | ORAL_TABLET | Freq: Every evening | ORAL | Status: DC | PRN
  Filled 2014-04-18 (×9): qty 1

## 2014-04-18 MED ORDER — ZIPRASIDONE HCL 20 MG PO CAPS
20.0000 mg | ORAL_CAPSULE | Freq: Every day | ORAL | Status: DC
  Administered 2014-04-18: 20 mg via ORAL
  Filled 2014-04-18 (×3): qty 1

## 2014-04-18 MED ORDER — DIPHENHYDRAMINE HCL 50 MG/ML IJ SOLN
50.0000 mg | Freq: Once | INTRAMUSCULAR | Status: DC
  Filled 2014-04-18: qty 1

## 2014-04-18 MED ORDER — OLANZAPINE 10 MG PO TBDP: 10.0000 mg | ORAL_TABLET | Freq: Two times a day (BID) | ORAL | Status: DC | PRN

## 2014-04-18 MED ORDER — HALOPERIDOL LACTATE 5 MG/ML IJ SOLN: 4.0000 mg | Freq: Three times a day (TID) | INTRAMUSCULAR | Status: DC | PRN

## 2014-04-18 MED ORDER — BENZTROPINE MESYLATE 1 MG/ML IJ SOLN
1.0000 mg | Freq: Once | INTRAMUSCULAR | Status: AC
  Administered 2014-04-18: 1 mg via INTRAMUSCULAR
  Filled 2014-04-18: qty 1
  Filled 2014-04-18: qty 2

## 2014-04-18 MED ORDER — ZIPRASIDONE MESYLATE 20 MG IM SOLR
20.0000 mg | Freq: Once | INTRAMUSCULAR | Status: AC
  Administered 2014-04-18: 20 mg via INTRAMUSCULAR
  Filled 2014-04-18 (×2): qty 20

## 2014-04-18 MED ORDER — DIPHENHYDRAMINE HCL 50 MG/ML IJ SOLN
50.0000 mg | Freq: Once | INTRAMUSCULAR | Status: AC
  Administered 2014-04-18: 50 mg via INTRAMUSCULAR
  Filled 2014-04-18 (×2): qty 1

## 2014-04-18 MED ORDER — BENZTROPINE MESYLATE 1 MG/ML IJ SOLN
1.0000 mg | Freq: Two times a day (BID) | INTRAMUSCULAR | Status: DC
  Filled 2014-04-18 (×2): qty 1

## 2014-04-18 MED ORDER — HALOPERIDOL DECANOATE 100 MG/ML IM SOLN
50.0000 mg | Freq: Once | INTRAMUSCULAR | Status: AC
  Administered 2014-04-18: 50 mg via INTRAMUSCULAR
  Filled 2014-04-18: qty 0.5

## 2014-04-18 MED ORDER — BENZTROPINE MESYLATE 1 MG PO TABS
1.0000 mg | ORAL_TABLET | Freq: Two times a day (BID) | ORAL | Status: DC
  Administered 2014-04-18: 1 mg via ORAL
  Filled 2014-04-18 (×6): qty 1

## 2014-04-18 MED ORDER — MAGNESIUM HYDROXIDE 400 MG/5ML PO SUSP
30.0000 mL | Freq: Every day | ORAL | Status: DC | PRN
  Administered 2014-04-22: 30 mL via ORAL

## 2014-04-18 MED ORDER — QUETIAPINE FUMARATE 50 MG PO TABS
50.0000 mg | ORAL_TABLET | Freq: Every day | ORAL | Status: DC
  Filled 2014-04-18: qty 1

## 2014-04-18 MED ORDER — QUETIAPINE FUMARATE 25 MG PO TABS
25.0000 mg | ORAL_TABLET | Freq: Two times a day (BID) | ORAL | Status: DC
  Filled 2014-04-18: qty 1

## 2014-04-18 MED ORDER — ACETAMINOPHEN 325 MG PO TABS
650.0000 mg | ORAL_TABLET | ORAL | Status: DC | PRN
  Administered 2014-04-24 – 2014-05-01 (×5): 650 mg via ORAL
  Filled 2014-04-18 (×5): qty 2

## 2014-04-18 MED ORDER — LORAZEPAM 1 MG PO TABS: 1.0000 mg | ORAL_TABLET | Freq: Three times a day (TID) | ORAL | Status: DC | PRN

## 2014-04-18 MED ORDER — HALOPERIDOL LACTATE 5 MG/ML IJ SOLN
5.0000 mg | Freq: Two times a day (BID) | INTRAMUSCULAR | Status: DC
  Filled 2014-04-18: qty 1

## 2014-04-18 NOTE — ED Notes (Signed)
[facility] called for transport 

## 2014-04-18 NOTE — BH Assessment (Signed)
[facility] Assessment Progress Note      [facility] to the service of Dr Jannifer FranklinAkintayo by Dr Lolly MustacheArfeen.  The patient will go to room 403-1.  ED staff notified.  Pt is under IVC.  [facility]
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
## 2014-04-18 NOTE — Progress Notes (Addendum)
28 year old female pt admitted on involuntary basis. On admission, pt appears paranoid and guarded and not responding appropriately to questions being asked. Pt often seen peering around the room suspiciously and appears to be responding to internal stimuli. Pt refused to sign any admission paper work, refused to allow temperature to be taken, and unable to answer most questions appropriately. Pt denied any pain on admission and did state that she has her own place and will go back there at discharge and spoke about working hard to be able to have her place. Pt did report that she did not have any support persons. Pt was oriented to the unit and safety maintained. Parts of the admission are incomplete as pt is unable to fully participate in the process.

## 2014-04-18 NOTE — [facility] Group Notes (Signed)
Adult Psychoeducational Group Note  Date:  04/18/2014 Time:  9:11 PM  Group Topic/Focus:  Wrap-Up Group:   The focus of this group is to help patients review their daily goal of treatment and discuss progress on daily workbooks.  Participation Level:  Did Not Attend  Participation Quality:  None  Affect:  None  Cognitive:  None  Insight: None  Engagement in Group:  None  Modes of Intervention:  Discussion  Additional Comments:  [name] did not attend group.  Caroll RancherLindsay, Carianna Lague A 04/18/2014, 9:11 PM
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
## 2014-04-19 ENCOUNTER — Encounter (HOSPITAL_COMMUNITY): Payer: Self-pay | Admitting: Psychiatry

## 2014-04-19 DIAGNOSIS — F1210 Cannabis abuse, uncomplicated: Secondary | ICD-10-CM | POA: Diagnosis present

## 2014-04-19 DIAGNOSIS — F22 Delusional disorders: Secondary | ICD-10-CM | POA: Diagnosis present

## 2014-04-19 DIAGNOSIS — F39 Unspecified mood [affective] disorder: Secondary | ICD-10-CM | POA: Diagnosis present

## 2014-04-19 DIAGNOSIS — F29 Unspecified psychosis not due to a substance or known physiological condition: Secondary | ICD-10-CM

## 2014-04-19 MED ORDER — DIPHENHYDRAMINE HCL 50 MG/ML IJ SOLN
INTRAMUSCULAR | Status: AC
  Filled 2014-04-19: qty 1

## 2014-04-19 MED ORDER — CARBAMAZEPINE ER 200 MG PO CP12
200.0000 mg | ORAL_CAPSULE | Freq: Two times a day (BID) | ORAL | Status: DC
  Administered 2014-04-20 – 2014-04-22 (×4): 200 mg via ORAL
  Filled 2014-04-19 (×10): qty 1

## 2014-04-19 MED ORDER — LORAZEPAM 2 MG/ML IJ SOLN
INTRAMUSCULAR | Status: AC
  Filled 2014-04-19: qty 1

## 2014-04-19 MED ORDER — ZIPRASIDONE MESYLATE 20 MG IM SOLR
20.0000 mg | Freq: Four times a day (QID) | INTRAMUSCULAR | Status: DC | PRN
  Administered 2014-04-19: 20 mg via INTRAMUSCULAR
  Filled 2014-04-19: qty 20

## 2014-04-19 MED ORDER — BENZTROPINE MESYLATE 0.5 MG PO TABS
0.5000 mg | ORAL_TABLET | Freq: Two times a day (BID) | ORAL | Status: DC
  Administered 2014-04-20 (×2): 0.5 mg via ORAL
  Filled 2014-04-19 (×9): qty 1

## 2014-04-19 MED ORDER — LORAZEPAM 2 MG/ML IJ SOLN
2.0000 mg | Freq: Four times a day (QID) | INTRAMUSCULAR | Status: DC | PRN
  Administered 2014-04-19: 2 mg via INTRAMUSCULAR
  Filled 2014-04-19: qty 1

## 2014-04-19 MED ORDER — DIPHENHYDRAMINE HCL 50 MG/ML IJ SOLN
50.0000 mg | Freq: Four times a day (QID) | INTRAMUSCULAR | Status: DC | PRN
  Administered 2014-04-19: 50 mg via INTRAMUSCULAR
  Filled 2014-04-19: qty 1

## 2014-04-19 MED ORDER — NAPROXEN 375 MG PO TABS
375.0000 mg | ORAL_TABLET | Freq: Two times a day (BID) | ORAL | Status: DC
  Administered 2014-04-20 – 2014-04-22 (×3): 375 mg via ORAL
  Filled 2014-04-19 (×11): qty 1

## 2014-04-19 MED ORDER — ZIPRASIDONE HCL 60 MG PO CAPS
60.0000 mg | ORAL_CAPSULE | Freq: Two times a day (BID) | ORAL | Status: DC
  Administered 2014-04-20: 60 mg via ORAL
  Filled 2014-04-19 (×6): qty 1

## 2014-04-19 MED ORDER — ZIPRASIDONE MESYLATE 20 MG IM SOLR
INTRAMUSCULAR | Status: AC
  Filled 2014-04-19: qty 20

## 2014-04-19 NOTE — H&P (Signed)
Psychiatric Admission Assessment Adult  Patient Identification:  [name] Date of Evaluation:  04/19/2014 Chief Complaint:  PSYCHOTIC DISORDER  SUBSTANCE ABUSE History of Present Illness::  [name] is a 28 year old female [facility] twenty four [facility] period of time. Patient had been discharged on Wednesday but returned acting belligerent and demanding. The patient was escorted out of the ED by [employer] for trespassing. She ended up in jail and somehow sustained a fracture to her left humerus. The Sheriff did not feel comfortable releasing her to the community in a psychotic state so IVC papers were taken out on her. The [employer] Deputy was familiar with the patient over the last several years due to multiple encounters with the law. He reported that he had never seen her in such an altered state.  Her UDS does show marijuana use, but no other substances and her medical evaluation does not indicate any reason for her persistant confused state. Her arm was not operated on due to her acute mental instability. Since arriving to the unit [name] has been agitated, labile, disorganized, paranoid and irritable. She was attempting to remove her cast last night but was stopped by nursing staff. Patient was not cooperative when speaking to the treatment team this morning. She was observed pacing in circles at the end of the hall talking to herself. Patient would not answer questions and denied that [name] was her name stating "That's not my name. I don't know what you want to talk about. I have no idea who [name] is. No further questions. Please leave me alone." The patient was noted to keep repeating "In the name of Jesus." She has needed redirection for wandering in other patient's rooms, wiping information off the white board, and taking door signs out of their holder. Dr. Dub MikesLugo has been consulted toobtain a second opinion for forced medications. Earlier patient refused  IM injections of Ativan, Benadryl, and Geodon. She has also been reported to have made other religiously focused statements such as "I am here in the name of Jesus Christ."   Elements:  Location:  Adult unit for psychosis . Quality:  Confusion, paranoia, disorganized behavior, aggression. Severity:  Severe . Timing:  Last [name] days. Duration:  Acute. Context:  Substance abuse, mental status changes . Associated Signs/Synptoms: Depression Symptoms: Unable to answer at this time (Hypo) Manic Symptoms:  Unable to answer at this time.  Anxiety Symptoms:  Unable to answer at this time  Psychotic Symptoms:  Delusions, Hallucinations: Auditory Paranoia, PTSD Symptoms: Unable to assess due to acute psychosis  Total Time spent with patient: 30 minutes  Psychiatric Specialty Exam: Physical Exam  Constitutional:  [facility] and [facility] concur with no noted exceptions. Left arm remains in cast.     Review of Systems  Unable to perform ROS: acuity of condition  Psychiatric/Behavioral: Positive for hallucinations and substance abuse. The patient is nervous/anxious.     Blood pressure 133/81, pulse 100, temperature 0 F (-17.8 C), temperature source Other (Comment), resp. rate 18, height 5\' 8"  (1.727 m), weight 83.462 kg (184 lb).Body mass index is 27.98 kg/(m^2).  General Appearance: Disheveled  Eye Contact::  Minimal  Speech:  minimal   Volume:  Increased  Mood:  Angry and Irritable  Affect:  Labile  Thought Process:  Circumstantial and Disorganized  Orientation:  Other:  Oriented to place, disputes her name is [name], would not answer questions about time.  Thought Content:  Delusions and Hallucinations: Auditory  Suicidal Thoughts:  No  Homicidal Thoughts:  No  Memory:  Immediate;   Fair Recent;   Fair Remote;   Fair  Judgement:  Impaired  Insight:  Lacking  Psychomotor Activity:  Increased  Concentration:  Fair  Recall:  [employer] of  Knowledge:Fair  Language: Good  Akathisia:  No  Handed:  Right  AIMS (if indicated):     Assets:  Communication Skills Desire for Improvement Physical Health  Sleep:  Number of Hours: 6   Musculoskeletal: Strength & Muscle Tone: within normal limits Gait & Station: normal Patient leans: N/A  Past Psychiatric History: Diagnosis:  Hospitalizations:  Outpatient Care:  Substance Abuse Care:  Self-Mutilation:  Suicidal Attempts:  Violent Behaviors:   Past Medical History:   Past Medical History  Diagnosis Date  . Asthma   . Anxiety   . Panic attacks   . Schizophrenia    None. Allergies:  No Known Allergies PTA Medications: Prescriptions prior to admission  Medication Sig Dispense Refill  . ibuprofen (ADVIL,MOTRIN) 200 MG tablet Take 400-600 mg by mouth every 6 (six) hours as needed for mild pain or moderate pain.      . traMADol (ULTRAM) 50 MG tablet Take 1 tablet (50 mg total) by mouth every 6 (six) hours as needed.  15 tablet  0    Previous Psychotropic Medications:  Medication/Dose  Unknown                Substance Abuse History in the last 12 months:  Yes.    Consequences of Substance Abuse: UDS positive for marijuana. Possible worsening of mental health symptoms.   Social History:  reports that she has been smoking.  She does not have any smokeless tobacco history on file. She reports that she drinks alcohol. She reports that she uses illicit drugs (Marijuana). Additional Social History:                      Current Place of Residence:   Place of Birth:   Family Members: Marital Status:  Unable to answer the question at this time Children:  Sons:  Daughters: Relationships: Education:  Unable to answer the question at this time Educational Problems/Performance: Religious Beliefs/Practices: History of Abuse (Emotional/Phsycial/Sexual) Occupational Experiences; Military History:  Unable to answer the question at this time  Legal  History: Hobbies/Interests:  Family History:  History reviewed. No pertinent family history.  No results found for this or any previous visit (from the past 72 hour(s)). Psychological Evaluations:  Assessment:   DSM5:  AXIS I:  Unspecified episodic mood disorder              Cannabis Abuse              Delusional Disorder               Psychotic Disorder  AXIS II:  Deferred AXIS III:   Past Medical History  Diagnosis Date  . Asthma   . Anxiety   . Panic attacks   . Schizophrenia    AXIS IV:  economic problems, other psychosocial or environmental problems, problems related to social environment and problems with primary support group AXIS V:  21-30 behavior considerably influenced by delusions or hallucinations OR serious impairment in judgment, communication OR inability to function in almost all areas  Treatment Plan/Recommendations:   1. Admit for crisis management and stabilization. Estimated length of stay 5-7 days. 2. Medication management  to reduce current symptoms to base line and improve the patient's level of functioning.  3. Develop treatment plan to decrease risk of relapse upon discharge of depressive symptoms and the need for readmission. 5. Group therapy to facilitate development of healthy coping skills to use for mood lability and psychosis.  6. Health care follow up as needed for medical problems.  7. Discharge plan to include therapy to help patient cope with stressors.  8. Call for Consult with Hospitalist for additional specialty patient services as needed.   Treatment Plan Summary: Daily contact with patient to assess and evaluate symptoms and progress in treatment Medication management Current Medications:  Current Facility-Administered Medications  Medication Dose Route Frequency Provider Last Rate Last Dose  . acetaminophen (TYLENOL) tablet 650 mg  650 mg Oral Q4H PRN Shuvon Rankin, NP      . alum & mag hydroxide-simeth (MAALOX/MYLANTA) 200-200-20  MG/5ML suspension 30 mL  30 mL Oral Q4H PRN Shuvon Rankin, NP      . benztropine (COGENTIN) tablet 0.5 mg  0.5 mg Oral BID Jaidon Ellery      . carbamazepine (EQUETRO) 12 hr capsule 200 mg  200 mg Oral BID Accalia Rigdon      . diphenhydrAMINE (BENADRYL) injection 50 mg  50 mg Intramuscular Q6H PRN Roselyn Doby      . hydrOXYzine (ATARAX/VISTARIL) tablet 25 mg  25 mg Oral Q6H PRN Kerry Hough, PA-C   25 mg at 04/18/14 2129  . LORazepam (ATIVAN) injection 2 mg  2 mg Intramuscular Q6H PRN Mihika Surrette      . magnesium hydroxide (MILK OF MAGNESIA) suspension 30 mL  30 mL Oral Daily PRN Shuvon Rankin, NP      . naproxen (NAPROSYN) tablet 375 mg  375 mg Oral BID WC Hildy Nicholl      . ondansetron (ZOFRAN) tablet 4 mg  4 mg Oral Q8H PRN Shuvon Rankin, NP      . traZODone (DESYREL) tablet 50 mg  50 mg Oral QHS,MR X 1 Spencer E Simon, PA-C      . ziprasidone (GEODON) capsule 60 mg  60 mg Oral BID WC Abraham Entwistle      . ziprasidone (GEODON) injection 20 mg  20 mg Intramuscular Q6H PRN Maitland Lesiak        Observation Level/Precautions:  15 minute checks  Laboratory:  CBC Chemistry Profile UDS  Psychotherapy:  Individual and Group Therapy   Medications:  Equetro 200 mg BID for improved mood stability, Geodon 60 mg BID for psychosis, Cogentin 0.5 mg BID for EPS prevention  Consultations:  As needed  Discharge Concerns:  Continued substance abuse   Estimated LOS: 5-7 days   Other:  Increase collateral information    I certify that inpatient services furnished can reasonably be expected to improve the patient's condition.   Fransisca Kaufmann NP-C 7/15/20151:47 PM   Patient seen, evaluated and I agree with notes by Nurse Practitioner. Thedore Mins, MD

## 2014-04-19 NOTE — Progress Notes (Signed)
D: Requested patient to allow IM injections.  Patient refused several times stating, "I'm not taking it.  Just leave me alone."  Patient was agitated and asked that she be left alone so she could read her bible.  She continued to refuse to let staff enter the room to medicate her.  Several attempts were initiated.  Patient was informed that the medication would be given and we needed her cooperation.  Patient was sitting in chair in her room and she continued to read the bible stating, "praise be to Jesus; praise be to Jesus."  Patient was put in a 2 person manual hold.  Each arm was held to allow nurse to admin. Injections.  Patient resisted and started yelling.  Medications were administered without further incident.  Patient stopped resisting and staff was able to release manual hold.  Total CIRT time was 2 minutes.  CIRT packet was completed.  Patient remained on 1:1 observation for 30 minutes post CIRT.  Patient asked for snack and something to drink.  Snack and fluids given.  Patient has been quiet, however, informed MHT "next time I will be swinging if they do that again."

## 2014-04-19 NOTE — [facility] Group Notes (Signed)
[facility] Note   04/19/2014 1:50 PM  Participation Quality:  Did not attend   [name], [name]

## 2014-04-19 NOTE — Progress Notes (Signed)
The focus of this group is to help patients review their daily goal of treatment and discuss progress on daily workbooks. Pt did not attend the evening group. 

## 2014-04-19 NOTE — Progress Notes (Signed)
30 Minute Observation Monitoring: This staff monitored pt for 30 minutes after medications were forced.  Pt presented as irritable and paranoid wanting this staff to not look at her and not wanting anyone interacting with her.  Pt kept going from her room to the 400 day room several times as if she were wanting to get away from this staff.    Pt slammed her nightstand drawer several times and while in the day room getting water, she became angry and slammed the trash can lid causing it to fall to the floor.  The MHT on the hall came in and spoke to the pt.  Pt expressed anger about having to take medications that "make me feel weird".  Pt stated that she was hungry but declined any snack.  She kept saying she wanted to go outside.  Pt kept holding onto her Bible.  Pt continued to show signs of irritability toward this staff until probably 25 had passed.  Pt was observed becoming drowsy and stated that she was getting sleepy.  Pt's nurse came to her room and explained the medications she was given.  Pt agreed to have a snack and walked to the 400 galley where this staff provided chocolate milk, yogurt, graham crackers, peanut butter at the pt's request.  Due to her limited use of her right arm, this staff opened the yogurt for her, but pt refused to eat it.  Pt was provided an unopened yogurt which she accepted.  At the end of the 30 minute observation, pt appeared more calm and thanked this staff for the food.  Pt was left in the 400 day room having her snack.

## 2014-04-19 NOTE — [facility] Group Notes (Signed)
[facility] Therapy  04/19/2014 , 1:51 PM    Type of Therapy:  [employer] Presentation  Participation Level:  Active  Participation Quality:  Attentive  Affect:  Blunted  Cognitive:  Oriented  Insight:  Limited  Engagement in Therapy:  Engaged  Modes of Intervention:  Discussion, Education and Socialization  Summary of Progress/Problems:  Onalee HuaDavid from [employer] came to present his recovery story and play the guitar.  Did not attend.  Daryel Geraldorth, Slayton Lubitz B 04/19/2014 , 1:51 PM

## 2014-04-19 NOTE — Progress Notes (Signed)
Patient ID: [name], female   DOB: 06/29/1986, 28 y.o.   MRN: [id number] 1:1 observation note:  Patient 1:1 for observation after 2 person manual hold for medication administration.  Patient has been uncooperative.  She is angry and in distress.  Patient is angry that she has a Comptrollersitter.  Information patient it was for 30 minutes.  Patient did ask for a snack saying she was hungry.  Snack and fluid given.  Patient has been sitting in day room reading her bible.  She did state to tech, "Next time they do that to me, I'll start swinging."  1:1 observation will end at 1455.

## 2014-04-19 NOTE — Progress Notes (Signed)
Patient ID: [name], female   DOB: 04/17/1986, 28 y.o.   MRN: [id number] D: Patient is irritable and angry this morning.  She is isolative to room and is refusing vitals and medications.  Patient yelled, "why do you people keep bothering me?  I want you to go away!  I'm not taking nothing!"  It was reported by night shift that she did go to breakfast without incident.  She refused to answer any questions so she is unable to assess for SI/HI/AVH. Patient is unwilling to participate in her treatment.  A: offer support and encouragement.  Assure patient that she is safe. Continue to monitor medication management and MD orders.  Safety checks completed every 15 minutes per protocol.  R: patient is isolative and refused to have any appropriate interaction with staff.

## 2014-04-19 NOTE — Progress Notes (Signed)
Patient ID: Jeanne FewJennifer D [name], female   DOB: 02/27/1986, 28 y.o.   MRN: [id number] 1:1 observation ended and discontinued.  Patient has been sitting quietly reading her bible.  No distress noted.  She does continue to refuse vital signs.

## 2014-04-19 NOTE — Progress Notes (Signed)
Patient ID: Jeanne FewJennifer D XXXKeene, female   DOB: 06/02/1986, 28 y.o.   MRN: [id number] Patient refusing ativan 2 mg IM, benedryl 50 mg IM and geodon 20 mg IM.  Patient is suspicious and guarded.  States, "I am here in the name of Jesus Christ."  Patient has been taking door signs out of their holder.  She has been wiping the white board clean and she has been wandering in other patient's room.  Dr. Dub MikesLugo to do 2nd opinion on patient this afternoon.

## 2014-04-19 NOTE — BHH Suicide Risk Assessment (Signed)
   Nursing information obtained from:    Demographic factors:    Current Mental Status:    Loss Factors:    Historical Factors:    Risk Reduction Factors:    Total Time spent with patient: 20 minutes  CLINICAL FACTORS:   Severe Anxiety and/or Agitation Bipolar Disorder: manic Alcohol/Substance Abuse/Dependencies Schizophrenia:   Paranoid or undifferentiated type Currently Psychotic  Psychiatric Specialty Exam: Physical Exam  Psychiatric: Her affect is angry and labile. Her speech is delayed. She is agitated and actively hallucinating. Thought content is paranoid. Cognition and memory are normal. She expresses impulsivity.    Review of Systems  Constitutional: Negative.   HENT: Negative.   Eyes: Negative.   Respiratory: Negative.   Cardiovascular: Negative.   Gastrointestinal: Negative.   Genitourinary: Negative.   Musculoskeletal:       Right arm pain  Skin: Negative.   Neurological: Negative.   Endo/Heme/Allergies: Negative.   Psychiatric/Behavioral: Positive for hallucinations and substance abuse. The patient is nervous/anxious and has insomnia.     Blood pressure 133/81, pulse 100, temperature 0 F (-17.8 C), temperature source Other (Comment), resp. rate 18, height 5\' 8"  (1.727 m), weight 83.462 kg (184 lb).Body mass index is 27.98 kg/(m^2).  General Appearance: Disheveled  Eye Contact::  Minimal  Speech:  minimal  Volume:  Increased  Mood:  Angry and Irritable  Affect:  Labile  Thought Process:  Circumstantial and Disorganized  Orientation:  Full (Time, Place, and Person)  Thought Content:  Delusions and Hallucinations: Auditory  Suicidal Thoughts:  No  Homicidal Thoughts:  No  Memory:  Immediate;   Fair Recent;   Fair Remote;   Fair  Judgement:  Impaired  Insight:  Lacking  Psychomotor Activity:  Increased  Concentration:  Fair  Recall:  [employer]  Fund of Knowledge:Fair  Language: Good  Akathisia:  No  Handed:  Right  AIMS (if indicated):     Assets:   Communication Skills Desire for Improvement Physical Health  Sleep:  Number of Hours: 6   Musculoskeletal: Strength & Muscle Tone: within normal limits Gait & Station: normal Patient leans: N/A  COGNITIVE FEATURES THAT CONTRIBUTE TO RISK:  Closed-mindedness Polarized thinking    SUICIDE RISK:   Mild:  Suicidal ideation of limited frequency, intensity, duration, and specificity.  There are no identifiable plans, no associated intent, mild dysphoria and related symptoms, good self-control (both objective and subjective assessment), few other risk factors, and identifiable protective factors, including available and accessible social support.  PLAN OF CARE:1. Admit for crisis management and stabilization. 2. Medication management to reduce current symptoms to base line and improve the patient's overall level of functioning 3. Treat health problems as indicated. 4. Develop treatment plan to decrease risk of relapse upon discharge and the need for readmission. 5. Psycho-social education regarding relapse prevention and self care. 6. Health care follow up as needed for medical problems. 7. Restart home medications where appropriate.   I certify that inpatient services furnished can reasonably be expected to improve the patient's condition.  Thedore MinsAkintayo, Janaki Exley, MD 04/19/2014, 11:15 AM

## 2014-04-19 NOTE — Tx Team (Signed)
  Interdisciplinary Treatment Plan Update   Date Reviewed:  04/19/2014  Time Reviewed:  8:09 AM  Progress in Treatment:   Attending groups: No Participating in groups: No Taking medication as prescribed: Yes  Tolerating medication: Yes Family/Significant other contact made: No Patient understands diagnosis: No  Limited insight Discussing patient identified problems/goals with staff: Yes  See initial care plan Medical problems stabilized or resolved: Yes Denies suicidal/homicidal ideation: No  Not communicating Patient has not harmed self or others: Yes  For review of initial/current patient goals, please see plan of care.  Estimated Length of Stay:  4-5 days  Reason for Continuation of Hospitalization: Anxiety Hallucinations Medication stabilization Other; describe Agitated mood  New Problems/Goals identified:  N/A  Discharge Plan or Barriers:   unknown  Additional Comments: 28 year old female pt admitted on involuntary basis. On admission, pt appears paranoid and guarded and not responding appropriately to questions being asked. Pt often seen peering around the room suspiciously and appears to be responding to internal stimuli. Pt refused to sign any admission paper work, refused to allow temperature to be taken, and unable to answer most questions appropriately. Pt denied any pain on admission and did state that she has her own place and will go back there at discharge and spoke about working hard to be able to have her place.     Attendees:  Signature: Thedore MinsMojeed Akintayo, MD 04/19/2014 8:09 AM   Signature: Richelle Itood Cortney Mckinney, LCSW 04/19/2014 8:09 AM  Signature: Fransisca KaufmannLaura Davis, NP 04/19/2014 8:09 AM  Signature: Joslyn Devonaroline Beaudry, RN 04/19/2014 8:09 AM  Signature: Liborio NixonPatrice White, RN 04/19/2014 8:09 AM  Signature:  04/19/2014 8:09 AM  Signature:   04/19/2014 8:09 AM  Signature:    Signature:    Signature:    Signature:    Signature:    Signature:      Scribe for Treatment Team:   Richelle Itood Oreta Soloway,  LCSW  04/19/2014 8:09 AM

## 2014-04-20 ENCOUNTER — Encounter (HOSPITAL_COMMUNITY): Payer: Self-pay | Admitting: Psychiatry

## 2014-04-20 DIAGNOSIS — F22 Delusional disorders: Secondary | ICD-10-CM

## 2014-04-20 DIAGNOSIS — F3110 Bipolar disorder, current episode manic without psychotic features, unspecified: Principal | ICD-10-CM

## 2014-04-20 DIAGNOSIS — F1210 Cannabis abuse, uncomplicated: Secondary | ICD-10-CM

## 2014-04-20 MED ORDER — LORAZEPAM 2 MG/ML IJ SOLN: 1.0000 mg | Freq: Four times a day (QID) | INTRAMUSCULAR | Status: DC | PRN

## 2014-04-20 MED ORDER — HALOPERIDOL 5 MG PO TABS
5.0000 mg | ORAL_TABLET | Freq: Two times a day (BID) | ORAL | Status: DC
  Filled 2014-04-20 (×4): qty 1

## 2014-04-20 MED ORDER — TRAZODONE HCL 150 MG PO TABS
150.0000 mg | ORAL_TABLET | Freq: Every day | ORAL | Status: DC
  Filled 2014-04-20 (×5): qty 1

## 2014-04-20 MED ORDER — LORAZEPAM 1 MG PO TABS
1.0000 mg | ORAL_TABLET | Freq: Two times a day (BID) | ORAL | Status: DC
  Administered 2014-04-20 – 2014-04-24 (×6): 1 mg via ORAL
  Filled 2014-04-20 (×6): qty 1

## 2014-04-20 MED ORDER — HALOPERIDOL LACTATE 5 MG/ML IJ SOLN
5.0000 mg | Freq: Two times a day (BID) | INTRAMUSCULAR | Status: DC
  Filled 2014-04-20 (×8): qty 1

## 2014-04-20 MED ORDER — HALOPERIDOL 5 MG PO TABS
5.0000 mg | ORAL_TABLET | Freq: Two times a day (BID) | ORAL | Status: DC
Start: 2014-04-20 — End: 2014-04-23
  Administered 2014-04-20 – 2014-04-23 (×6): 5 mg via ORAL
  Filled 2014-04-20 (×8): qty 1

## 2014-04-20 NOTE — Progress Notes (Signed)
1700 Medications given to patient early with NP permission. Patient labile, delusional, and paranoid. Will continue to monitor patient for safety.

## 2014-04-20 NOTE — BHH Group Notes (Signed)
BHH Group Notes:  (Counselor/Nursing/MHT/Case Management/Adjunct)  04/20/2014 1:15PM  Type of Therapy:  Group Therapy  Participation Level:  Active  Participation Quality:  Appropriate  Affect:  Flat  Cognitive:  Oriented  Insight:  Improving  Engagement in Group:  Limited  Engagement in Therapy:  Limited  Modes of Intervention:  Discussion, Exploration and Socialization  Summary of Progress/Problems: The topic for group was balance in life.  Pt participated in the discussion about when their life was in balance and out of balance and how this feels.  Pt discussed ways to get back in balance and short term goals they can work on to get where they want to be.  Amazingly, [name] stayed for most of group, and even participated minimally.  She was somewhat disorganized and seemed to have a difficult time tracking.  Her response to balance was that she is trying not to smoke, but could not elaborate, other than saying it is bad for you.  Also, "circles represent love."  Laughed to herself several times during group.  Abruptly got up and left with about 10 minutes left.   Daryel Geraldorth, Bayron Dalto B 04/20/2014 2:55 PM

## 2014-04-20 NOTE — Progress Notes (Signed)
D: Pt in bed asleep for the beginning of the shift. Pt was awake around midnight. Pt was in bed lying in supine position. Writer greeted pt. Pt denied any SI/HI/AVH. Pt also denied any pain. Pt was informed that I had a dose of Trazodone on hold for her. Pt states, " I do not take pills'". Pt was offered something to eat and drink. Pt requested some milk. Milk was provided as requested. Pt was noted to be smiling inappropriately during the course of our interaction. Pt denied any further concerns she wished for this writer to address at this time.  A: Continued support and availability as needed was extended to this pt. Staff continue to monitor pt with q7315min checks.  R: No adverse drug reactions noted. Pt receptive to treatment. Pt remains safe at this time.

## 2014-04-20 NOTE — Progress Notes (Signed)
D: Patient denies SI/HI but is positive for auditory and visual hallucinations. The patient has a labile mood and affect. The patient is hyper-religious, paranoid, delusional, and grandiose at times. The patient believes staff are "out to get her" and that staff "just want to dope her up with medication." The patient requires frequent redirection during medication administration, as it took the patient 30 minutes to swallow her pills.  A: Patient given emotional support from RN. Patient encouraged to come to staff with concerns and/or questions. Patient's medication routine continued. Patient's orders and plan of care reviewed. MD/NP notified about need of IM medication for patient's forced med order (injectables for forced meds were not the same as the scheduled meds).  R: Patient remains safe but is still labile, paranoid, and delusional. Will continue to monitor patient q15 minutes for safety.

## 2014-04-20 NOTE — Progress Notes (Signed)
[facility] MD Progress Note  04/20/2014 11:12 AM [name]  MRN:  [id number] Subjective: Patient states: ''I don't want to talk to you, go away from me .'' Objective: Patient is seen and chart is reviewed. Patient has been refusing her medications, she remains easily agitated, labile, psychotic and delusional. Patient has been pacing the hallway and talking to herself as if responding to an internal stimuli. She is extremely delusional saying that she will only talk to Jesus not the provider. Patient reportedly did not sleep at last night, she was agitated and had to receive Geodon injection after she refused oral medications. Diagnosis:   DSM5: Schizophrenia Disorders:  Delusional Disorder (297.1) and Brief Psychotic Disorder (298.8) Obsessive-Compulsive Disorders:   Trauma-Stressor Disorders:   Substance/Addictive Disorders:  Cannabis Use Disorder - Severe (304.30) Depressive Disorders:  Disruptive Mood Dysregulation Disorder (296.99) Total Time spent with patient: 30 minutes  Axis I: Bipolar I disorder, most recent episode (or current) manic           Delusional disorder, NOS           Cannabis use disorder Axis II: Cluster B Traits Axis III:  Past Medical History  Diagnosis Date  . Asthma   . Anxiety   . Panic attacks   . Schizophrenia   . Bipolar disorder    Axis IV: other psychosocial or environmental problems and problems related to social environment  ADL's:  Impaired  Sleep: Fair  Appetite:  Fair  Suicidal Ideation:  denies Homicidal Ideation:  denies AEB (as evidenced by):  Psychiatric Specialty Exam: Physical Exam  Psychiatric: Her affect is angry and labile. Her speech is rapid and/or pressured and tangential. She is agitated, aggressive and actively hallucinating. Thought content is paranoid. Cognition and memory are normal. She expresses impulsivity.    Review of Systems  Constitutional: Negative.   HENT: Negative.   Eyes: Negative.   Respiratory:  Negative.   Cardiovascular: Negative.   Gastrointestinal: Negative.   Genitourinary: Negative.   Musculoskeletal: Negative.   Skin: Negative.   Neurological: Negative.   Endo/Heme/Allergies: Negative.   Psychiatric/Behavioral: Positive for substance abuse. The patient is nervous/anxious and has insomnia.     Blood pressure 133/81, pulse 100, temperature 0 F (-17.8 C), temperature source Other (Comment), resp. rate 18, height 5\' 8"  (1.727 m), weight 83.462 kg (184 lb).Body mass index is 27.98 kg/(m^2).  General Appearance: Disheveled  Eye Contact::  Minimal  Speech:  Pressured  Volume:  Increased  Mood:  Angry and Irritable  Affect:  Labile  Thought Process:  Circumstantial and Disorganized  Orientation:  Full (Time, Place, and Person)  Thought Content:  Hallucinations: Auditory and Paranoid Ideation  Suicidal Thoughts:  No  Homicidal Thoughts:  No  Memory:  Immediate;   Fair Recent;   Fair Remote;   Fair  Judgement:  Impaired  Insight:  Lacking  Psychomotor Activity:  Increased  Concentration:  Fair  Recall:  [employer] of Knowledge:Fair  Language: Good  Akathisia:  No  Handed:  Right  AIMS (if indicated):     Assets:  Communication Skills Physical Health  Sleep:  Number of Hours: 0.5   Musculoskeletal: Strength & Muscle Tone: within normal limits Gait & Station: normal Patient leans: N/A  Current Medications: Current Facility-Administered Medications  Medication Dose Route Frequency Provider Last Rate Last Dose  . acetaminophen (TYLENOL) tablet 650 mg  650 mg Oral Q4H PRN Shuvon Rankin, NP      . alum & mag hydroxide-simeth (MAALOX/MYLANTA)  200-200-20 MG/5ML suspension 30 mL  30 mL Oral Q4H PRN Shuvon Rankin, NP      . benztropine (COGENTIN) tablet 0.5 mg  0.5 mg Oral BID Haniya Fern   0.5 mg at 04/20/14 0838  . carbamazepine (EQUETRO) 12 hr capsule 200 mg  200 mg Oral BID Andrej Spagnoli   200 mg at 04/20/14 0838  . diphenhydrAMINE (BENADRYL) injection 50 mg   50 mg Intramuscular Q6H PRN Zaniah Titterington   50 mg at 04/19/14 1615  . haloperidol (HALDOL) tablet 5 mg  5 mg Oral BID March Steyer      . hydrOXYzine (ATARAX/VISTARIL) tablet 25 mg  25 mg Oral Q6H PRN Kerry HoughSpencer E Simon, PA-C   25 mg at 04/18/14 2129  . LORazepam (ATIVAN) injection 1 mg  1 mg Intramuscular Q6H PRN Shunta Mclaurin      . LORazepam (ATIVAN) tablet 1 mg  1 mg Oral BID Clebert Wenger      . magnesium hydroxide (MILK OF MAGNESIA) suspension 30 mL  30 mL Oral Daily PRN Shuvon Rankin, NP      . naproxen (NAPROSYN) tablet 375 mg  375 mg Oral BID WC Raizy Auzenne   375 mg at 04/20/14 0838  . ondansetron (ZOFRAN) tablet 4 mg  4 mg Oral Q8H PRN Shuvon Rankin, NP      . traZODone (DESYREL) tablet 150 mg  150 mg Oral QHS Laprecious Austill      . ziprasidone (GEODON) injection 20 mg  20 mg Intramuscular Q6H PRN Coyle Stordahl   20 mg at 04/19/14 1615    Lab Results: No results found for this or any previous visit (from the past 48 hour(s)).  Physical Findings: AIMS: Facial and Oral Movements Muscles of Facial Expression: None, normal Lips and Perioral Area: None, normal Jaw: None, normal Tongue: None, normal,Extremity Movements Upper (arms, wrists, hands, fingers): None, normal Lower (legs, knees, ankles, toes): None, normal, Trunk Movements Neck, shoulders, hips: None, normal, Overall Severity Severity of abnormal movements (highest score from questions above): None, normal Incapacitation due to abnormal movements: None, normal Patient's awareness of abnormal movements (rate only patient's report): No Awareness, Dental Status Current problems with teeth and/or dentures?: No Does patient usually wear dentures?: No  CIWA:    COWS:     Treatment Plan Summary: Daily contact with patient to assess and evaluate symptoms and progress in treatment Medication management  Plan:1. Admit for crisis management and stabilization. 2. Medication management to reduce current symptoms to  base line and improve the patient's overall level of functioning: -Initiate Haldol 5mg  po bid for psychosis/delusions -Continue Carbamazepine XR 200mg  po BID for mood stabilization. -Continue Cogentin 0.5mg  po bid for EPS prevention. -Continue Trazodone 150mg  po qhs for insomnia. 3. Treat health problems as indicated. 4. Develop treatment plan to decrease risk of relapse upon discharge and the need for     readmission. 5. Psycho-social education regarding relapse prevention and self care. 6. Health care follow up as needed for medical problems. 7. Restart home medications where appropriate.  Medical Decision Making Problem Points:  Established problem, worsening (2), Review of last therapy session (1) and Review of psycho-social stressors (1) Data Points:  Order Aims Assessment (2) Review or order clinical lab tests (1) Review of medication regiment & side effects (2) Review of new medications or change in dosage (2)  I certify that inpatient services furnished can reasonably be expected to improve the patient's condition.   Thedore MinsAkintayo, Torrion Witter, MD 04/20/2014, 11:12 AM

## 2014-04-21 MED ORDER — BENZTROPINE MESYLATE 0.5 MG PO TABS
0.5000 mg | ORAL_TABLET | Freq: Two times a day (BID) | ORAL | Status: DC
  Administered 2014-04-21 – 2014-04-25 (×7): 0.5 mg via ORAL
  Filled 2014-04-21 (×12): qty 1

## 2014-04-21 MED ORDER — BENZTROPINE MESYLATE 1 MG/ML IJ SOLN
0.5000 mg | Freq: Two times a day (BID) | INTRAMUSCULAR | Status: DC
  Filled 2014-04-21 (×12): qty 0.5

## 2014-04-21 NOTE — Clinical Social Work Note (Signed)
Attempted to do PSA again today as I have everyday since admission.  Pt continues to be disorganized, reluctant/unwilling to speak except to state that she needs to be discharged to take care of things outside of here.

## 2014-04-21 NOTE — [facility] Group Notes (Signed)
[facility]   04/21/2014 10:35 AM  Participation Quality:  Did not attend   [name], Daana Petrasek B

## 2014-04-21 NOTE — Progress Notes (Signed)
Pt labile and quiet. Pt continues to pace the hallways and has been refusing group sessions. Pt also refused scheduled meds this morning but did agree to take PO haldol. Pt remains paranoid at times and says she is ready to discharge. RN encouraged Pt to take meds and participate. RN continues to monitor and check on Pt periodically. Pt remains safe.

## 2014-04-21 NOTE — [facility] Group Notes (Signed)
[facility] LCSW Group Therapy  04/21/2014  1:05 PM  Type of Therapy:  Group therapy  Participation Level:  Active  Participation Quality:  Attentive  Affect:  Flat  Cognitive:  Oriented  Insight:  Limited  Engagement in Therapy:  Limited  Modes of Intervention:  Discussion, Socialization  Summary of Progress/Problems:  Chaplain was here to lead a group on themes of hope and courage.  "Courage is getting out of here.  My dad is courageous."  That was pretty much her contribution to the group.  She continues to come to group faithfully, even though she has her own internal process going on.  She appears anxious and agitated, but does not take that out on others.  She wraped up by saying that her faith gives her courage.  Ida Rogueorth, Linzee Depaul B 04/21/2014 12:35 PM

## 2014-04-21 NOTE — Progress Notes (Signed)
[facility] Group Notes:  (Nursing/MHT/Case Management/Adjunct)  Date:  04/21/2014  Time:  9:21 PM  Type of Therapy:  Group Therapy  Participation Level:  None  Participation Quality:  Inattentive  Affect:  Flat  Cognitive:  Alert  Insight:  None  Engagement in Group:  None  Modes of Intervention:  Socialization and Support  Summary of Progress/Problems: Pt. Walked out of group after sitting in group for 10 minutes.  Sondra ComeWilson, Kenijah Benningfield J 04/21/2014, 9:21 PM

## 2014-04-21 NOTE — Progress Notes (Signed)
Patient ID: Jeanne FewJennifer [name] XXXKeene, female   DOB: 02/15/1986, 28 y.o.   MRN: [id number] I have reviewed the patient's chart  We previously placed her in a cast. We have recommended followup at our office for orthopedic post fracture care.  Given the events and her labile mental capacity I feel that my previous recommendations of nonsurgical management should continue. There is no doubt at this juncture given the charted lack of progress that a surgical algorithm would be fraught with complications that could even be limb threatening.  I would refer to my prior recommendations.  Will be happy to see her in followup in my office  Hopefully she will get her mental health stabilized. Obviously this is quite a challenge.  Dammon Makarewicz M.[name].

## 2014-04-21 NOTE — Progress Notes (Signed)
D: Pt is blunted in affect and labile in mood. Pt presents delusional and paranoid. Pt still verbalizes non-acceptance of taking any medications. Pt had minimal interaction within the milieu. Pt did not go to Karaoke this evening. She was guarded and irritable towards any questions about the progress of her stay here. Pt denied any SI/HI/AVH. A: Continued support and availability as needed was extended to this pt. Staff continue to monitor pt with q4415min checks.  R: No adverse drug reactions noted. Pt remains safe at this time.

## 2014-04-21 NOTE — Progress Notes (Signed)
Patient ID: [name], female   DOB: 10/27/1985, 28 y.o.   MRN: [id number] [facility]  04/21/2014 12:56 PM [name]  MRN:  [id number] Subjective: Patient states: ''I'm ready to go home. I took the medicine. I got stuff to handle. I'm fine. Please in the name of Jesus let me go home."   Objective: Patient is seen and chart is reviewed. Patient has been refusing her medications, she remains easily agitated, labile, psychotic and delusional. She refused her Haldol this morning but took it later in the morning. Patient has been pacing around the unit and talking to herself as if responding to an internal stimuli. She is extremely delusional praying to Jesus for help with her discharge. Nursing staff report the patient continues to be labile and very paranoid. She has no insight into her current mental state. Patient now has an order for forced medications after obtaining second opinion.   Diagnosis:   DSM5: Schizophrenia Disorders:  Delusional Disorder (297.1) and Brief Psychotic Disorder (298.8) Obsessive-Compulsive Disorders:   Trauma-Stressor Disorders:   Substance/Addictive Disorders:  Cannabis Use Disorder - Severe (304.30) Depressive Disorders:  Disruptive Mood Dysregulation Disorder (296.99) Total Time spent with patient: 20 minutes  Axis I: Bipolar I disorder, most recent episode (or current) manic           Delusional disorder, NOS           Cannabis use disorder Axis II: Cluster B Traits Axis III:  Past Medical History  Diagnosis Date  . Asthma   . Anxiety   . Panic attacks   . Schizophrenia   . Bipolar disorder    Axis IV: other psychosocial or environmental problems and problems related to social environment  ADL's:  Impaired  Sleep: Fair  Appetite:  Fair  Suicidal Ideation:  denies Homicidal Ideation:  denies AEB (as evidenced by):  Psychiatric Specialty Exam: Physical Exam  Psychiatric: Her affect is angry and labile. Her speech is  rapid and/or pressured and tangential. She is agitated, aggressive and actively hallucinating. Thought content is paranoid. Cognition and memory are normal. She expresses impulsivity.    Review of Systems  Constitutional: Negative.   HENT: Negative.   Eyes: Negative.   Respiratory: Negative.   Cardiovascular: Negative.   Gastrointestinal: Negative.   Genitourinary: Negative.   Musculoskeletal: Negative.   Skin: Negative.   Neurological: Negative.   Endo/Heme/Allergies: Negative.   Psychiatric/Behavioral: Positive for hallucinations and substance abuse. The patient is nervous/anxious and has insomnia.     Blood pressure 129/77, pulse 87, temperature 0 F (-17.8 C), temperature source Other (Comment), resp. rate 20, height 5\' 8"  (1.727 m), weight 83.462 kg (184 lb).Body mass index is 27.98 kg/(m^2).  General Appearance: Disheveled  Eye Contact::  Minimal  Speech:  Pressured  Volume:  Increased  Mood:  Angry and Irritable  Affect:  Labile  Thought Process:  Circumstantial and Disorganized  Orientation:  Full (Time, Place, and Person)  Thought Content:  Hallucinations: Auditory and Paranoid Ideation  Suicidal Thoughts:  No  Homicidal Thoughts:  No  Memory:  Immediate;   Fair Recent;   Fair Remote;   Fair  Judgement:  Impaired  Insight:  Lacking  Psychomotor Activity:  Increased  Concentration:  Fair  Recall:  [employer]  Fund of Knowledge:Fair  Language: Good  Akathisia:  No  Handed:  Right  AIMS (if indicated):     Assets:  Communication Skills Physical Health  Sleep:  Number of Hours: 5  Musculoskeletal: Strength & Muscle Tone: within normal limits Gait & Station: normal Patient leans: N/A  Current Medications: Current Facility-Administered Medications  Medication Dose Route Frequency Provider Last Rate Last Dose  . acetaminophen (TYLENOL) tablet 650 mg  650 mg Oral Q4H PRN Shuvon Rankin, NP      . alum & mag hydroxide-simeth (MAALOX/MYLANTA) 200-200-20 MG/5ML  suspension 30 mL  30 mL Oral Q4H PRN Shuvon Rankin, NP      . benztropine (COGENTIN) tablet 0.5 mg  0.5 mg Oral BID Roc Streett   0.5 mg at 04/20/14 1526  . carbamazepine (EQUETRO) 12 hr capsule 200 mg  200 mg Oral BID Taliana Mersereau   200 mg at 04/20/14 1525  . diphenhydrAMINE (BENADRYL) injection 50 mg  50 mg Intramuscular Q6H PRN Webber Michiels   50 mg at 04/19/14 1615  . haloperidol (HALDOL) tablet 5 mg  5 mg Oral BID Fransisca Kaufmann, NP   5 mg at 04/21/14 1610   Or  . haloperidol lactate (HALDOL) injection 5 mg  5 mg Intramuscular BID Fransisca Kaufmann, NP      . hydrOXYzine (ATARAX/VISTARIL) tablet 25 mg  25 mg Oral Q6H PRN Kerry Hough, PA-C   25 mg at 04/18/14 2129  . LORazepam (ATIVAN) injection 1 mg  1 mg Intramuscular Q6H PRN Smt Lokey      . LORazepam (ATIVAN) tablet 1 mg  1 mg Oral BID Kennadee Walthour   1 mg at 04/20/14 1526  . magnesium hydroxide (MILK OF MAGNESIA) suspension 30 mL  30 mL Oral Daily PRN Shuvon Rankin, NP      . naproxen (NAPROSYN) tablet 375 mg  375 mg Oral BID WC Delwin Raczkowski   375 mg at 04/20/14 1526  . ondansetron (ZOFRAN) tablet 4 mg  4 mg Oral Q8H PRN Shuvon Rankin, NP      . traZODone (DESYREL) tablet 150 mg  150 mg Oral QHS Trexton Escamilla      . ziprasidone (GEODON) injection 20 mg  20 mg Intramuscular Q6H PRN Orestes Geiman   20 mg at 04/19/14 1615    Lab Results: No results found for this or any previous visit (from the past 48 hour(s)).  Physical Findings: AIMS: Facial and Oral Movements Muscles of Facial Expression: None, normal Lips and Perioral Area: None, normal Jaw: None, normal Tongue: None, normal,Extremity Movements Upper (arms, wrists, hands, fingers): None, normal Lower (legs, knees, ankles, toes): None, normal, Trunk Movements Neck, shoulders, hips: None, normal, Overall Severity Severity of abnormal movements (highest score from questions above): None, normal Incapacitation due to abnormal movements: None, normal Patient's  awareness of abnormal movements (rate only patient's report): No Awareness, Dental Status Current problems with teeth and/or dentures?: No Does patient usually wear dentures?: No  CIWA:    COWS:     Treatment Plan Summary: Daily contact with patient to assess and evaluate symptoms and progress in treatment Medication management  Plan:1. Continue crisis management and stabilization. 2. Medication management to reduce current symptoms to base line and improve the patient's overall level of functioning: -Continue Haldol 5mg  po/IM bid for psychosis/delusions -Continue Carbamazepine XR 200mg  po BID for mood stabilization. -Continue Cogentin 0.5mg  po/IM bid for EPS prevention. -Continue Trazodone 150mg  po qhs for insomnia. 3. Treat health problems as indicated. 4. Develop treatment plan to decrease risk of relapse upon discharge and the need for  readmission. 5. Psycho-social education regarding relapse prevention and self care. 6. Health care follow up as needed for medical problems. 7. Restart home  medications where appropriate.  Medical Decision Making Problem Points:  Established problem, worsening (2), Review of last therapy session (1) and Review of psycho-social stressors (1) Data Points:  Order Aims Assessment (2) Review or order clinical lab tests (1) Review of medication regiment & side effects (2)  I certify that inpatient services furnished can reasonably be expected to improve the patient's condition.   Fransisca Kaufmann, NP-C 04/21/2014, 12:56 PM  Patient seen, evaluated and I agree with notes by Nurse Practitioner. Thedore Mins, MD

## 2014-04-21 NOTE — Progress Notes (Signed)
Patient in cafeteria for dinner. MHT advised this RN that patient stated, "I don't care what you have to say. I'm not listening to this. I'm going back to my room. Staff attempted to redirect patient. When patient refused to remain in cafeteria, staff accompanied patient back to unit.

## 2014-04-22 MED ORDER — NICOTINE 21 MG/24HR TD PT24
21.0000 mg | MEDICATED_PATCH | Freq: Every day | TRANSDERMAL | Status: DC
  Administered 2014-04-22 – 2014-05-02 (×8): 21 mg via TRANSDERMAL
  Filled 2014-04-22 (×13): qty 1

## 2014-04-22 NOTE — Progress Notes (Signed)
D: pt is suspicious and paranoid this afternoon. Pt is persistent about having someone look at her cast and wanting to get it removed. Pt took morning medications without issue. denies si/hi/avh. Denies pain. Pt is admit that she is allergic to naproxen and Carbamezapine. Pt stated that she is having trouble breathing and believes she is having an allergic reaction. NP spoke with pt and looked at pt hand.  A: ice pack and extra pillows given to help with swelling of hand. scheduled medications given. Support and encouragement offered. q 15 min safety checks R: pt remains safe on unit.no physical signs of distress noted.

## 2014-04-22 NOTE — Progress Notes (Signed)
Writer entered patients room and observed her lying in bed asleep with eyes closed and respirations even and unlabored. Writer called her name 2-3 times and she was snoring while sleeping. Writer has been unable to talk with or assess her so far this evening b/c she has been in bed asleep since shift change. Safety maintained with 15 min checks in place.

## 2014-04-22 NOTE — Progress Notes (Signed)
Report received from Jarome Matin. Jones RN. Writer entered patients room and observed her lying in bed asleep with her head propped against the wall. No signs of distress noted. Patient is safe with 15 min checks in place.

## 2014-04-22 NOTE — Progress Notes (Signed)
Didn't attend group 

## 2014-04-22 NOTE — [facility] Group Notes (Signed)
[facility] Group Notes:  (Nursing/MHT/Case Management/Adjunct)  Date:  04/22/2014  Time:  9:50AM Type of Therapy:  Self Inventory  Participation Level:  None  Participation Quality:  Resistant  Affect:  Resistant  Cognitive:  Alert  Insight:  None  Engagement in Group:  Resistant  Modes of Intervention:  Discussion  Summary of Progress/Problems: pt was resistant in group. Did not participate or engage in conversation  Kellie Moorerry, Takeyah Wieman M 04/22/2014, 11:19 AM

## 2014-04-22 NOTE — [facility] Group Notes (Signed)
[facility] Group Notes:  (Nursing/MHT/Case Management/Adjunct)  Date:  04/22/2014  Time:  10:05 AM  Type of Therapy:  Psychoeducational Skills  Participation Level:  None  Participation Quality:  Resistant  Affect:  Resistant  Cognitive:  Alert  Insight:  None  Engagement in Group:  Resistant  Modes of Intervention:  Discussion  Summary of Progress/Problems: pt did not engage in conversation. Pt was resistant and refused to particpate.  Heriberto Antiguaerry, Dayle Sherpa M 04/22/2014, 11:20 AM

## 2014-04-22 NOTE — [facility] Group Notes (Signed)
[facility] Group Notes: (Clinical Social Work)   04/22/2014      Type of Therapy:  Group Therapy   Participation Level:  Did Not Attend    Peniel Hass Grossman-Orr, LCSW 04/22/2014, 11:56 AM     

## 2014-04-22 NOTE — Progress Notes (Signed)
Patient ID: [name], female   DOB: 02/10/1986, 28 y.o.   [id number] [facility]  04/22/2014 10:36 AM [name]  MRN:  [id number] Subjective: Reports she feels " fine". Denies arm pain.   Objective: Patient was seen  With RN and chart  reviewed.  Patient has been irritable, guarded, paranoid, and pacing on unit. She presents guarded and answers most questions curtly and with short phrases or monosyllables. She appears irritable and angry, but describes her mood as " fine". As per nursing she has intermittently refused her medication , but today did take it. Of note, patient states she is " allergic " to Carbamazepine, but when asked to clarify, states that " those meds make me feel tired, sick". She is not endorsing rash or any actual allergy, and none is apparent at this time.   Insight is poor. Milieu participation is limited. Orthopedics have been following- R arm fracture, now casted.    Diagnosis:   DSM5: Schizophrenia Disorders:  Delusional Disorder (297.1) and Brief Psychotic Disorder (298.8) Obsessive-Compulsive Disorders:   Trauma-Stressor Disorders:   Substance/Addictive Disorders:  Cannabis Use Disorder - Severe (304.30) Depressive Disorders:  Disruptive Mood Dysregulation Disorder (296.99) Total Time spent with patient: 20 minutes  Axis I: Bipolar I disorder, most recent episode (or current) manic           Delusional disorder, NOS           Cannabis use disorder Axis II: Cluster B Traits Axis III:  Past Medical History  Diagnosis Date  . Asthma   . Anxiety   . Panic attacks   . Schizophrenia   . Bipolar disorder    Axis IV: other psychosocial or environmental problems and problems related to social environment  ADL's:  Impaired  Sleep: Fair  Appetite:  Fair  Suicidal Ideation:  denies Homicidal Ideation:  denies AEB (as evidenced by):  Psychiatric Specialty Exam: Physical Exam  Psychiatric: Her affect is angry and  labile. Her speech is rapid and/or pressured and tangential. She is agitated, aggressive and actively hallucinating. Thought content is paranoid. Cognition and memory are normal. She expresses impulsivity.    Review of Systems  Constitutional: Negative.   HENT: Negative.   Eyes: Negative.   Respiratory: Negative.   Cardiovascular: Negative.   Gastrointestinal: Negative.   Genitourinary: Negative.   Musculoskeletal: Negative.   Skin: Negative.   Neurological: Negative.   Endo/Heme/Allergies: Negative.   Psychiatric/Behavioral: Positive for hallucinations and substance abuse. The patient is nervous/anxious and has insomnia.     Blood pressure 119/72, pulse 83, temperature 98.5 F (36.9 C), temperature source Oral, resp. rate 16, height 5\' 8"  (1.727 m), weight 83.462 kg (184 lb).Body mass index is 27.98 kg/(m^2).  General Appearance: Fairly groomed   Patent attorneyye Contact::  Fair   Speech:  Normal  Volume:  Increased  Mood:  Angry and Irritable  Affect:  Labile, angry  Thought Process:  Circumstantial and Disorganized  Orientation:  Full (Time, Place, and Person)  Thought Content:  Appears paranoid and guarded, but denies hallucinations  Suicidal Thoughts:  No- denies suicidal ideations  Homicidal Thoughts:  No  Memory: NA   Judgement:  Impaired  Insight:  Lacking  Psychomotor Activity:  Increased- tends to pace on unit  Concentration:  Fair  Recall:  FiservFair  Fund of Knowledge:Fair  Language: Good  Akathisia:  No  Handed:  Right  AIMS (if indicated):     Assets:  Communication Skills Physical Health  Sleep:  Number of Hours: 5.5   Musculoskeletal: Strength & Muscle Tone: within normal limits Gait & Station: normal Patient leans: N/A  Current Medications: Current Facility-Administered Medications  Medication Dose Route Frequency Provider Last Rate Last Dose  . acetaminophen (TYLENOL) tablet 650 mg  650 mg Oral Q4H PRN Shuvon Rankin, NP      . alum & mag hydroxide-simeth  (MAALOX/MYLANTA) 200-200-20 MG/5ML suspension 30 mL  30 mL Oral Q4H PRN Shuvon Rankin, NP      . benztropine (COGENTIN) tablet 0.5 mg  0.5 mg Oral BID Fransisca Kaufmann, NP   0.5 mg at 04/22/14 0960   Or  . benztropine mesylate (COGENTIN) injection 0.5 mg  0.5 mg Intramuscular BID Fransisca Kaufmann, NP      . carbamazepine (EQUETRO) 12 hr capsule 200 mg  200 mg Oral BID Mojeed Akintayo   200 mg at 04/22/14 0755  . diphenhydrAMINE (BENADRYL) injection 50 mg  50 mg Intramuscular Q6H PRN Mojeed Akintayo   50 mg at 04/19/14 1615  . haloperidol (HALDOL) tablet 5 mg  5 mg Oral BID Fransisca Kaufmann, NP   5 mg at 04/22/14 4540   Or  . haloperidol lactate (HALDOL) injection 5 mg  5 mg Intramuscular BID Fransisca Kaufmann, NP      . hydrOXYzine (ATARAX/VISTARIL) tablet 25 mg  25 mg Oral Q6H PRN Kerry Hough, PA-C   25 mg at 04/18/14 2129  . LORazepam (ATIVAN) injection 1 mg  1 mg Intramuscular Q6H PRN Mojeed Akintayo      . LORazepam (ATIVAN) tablet 1 mg  1 mg Oral BID Mojeed Akintayo   1 mg at 04/22/14 0755  . magnesium hydroxide (MILK OF MAGNESIA) suspension 30 mL  30 mL Oral Daily PRN Shuvon Rankin, NP      . naproxen (NAPROSYN) tablet 375 mg  375 mg Oral BID WC Mojeed Akintayo   375 mg at 04/22/14 0755  . ondansetron (ZOFRAN) tablet 4 mg  4 mg Oral Q8H PRN Shuvon Rankin, NP      . traZODone (DESYREL) tablet 150 mg  150 mg Oral QHS Mojeed Akintayo      . ziprasidone (GEODON) injection 20 mg  20 mg Intramuscular Q6H PRN Mojeed Akintayo   20 mg at 04/19/14 1615    Lab Results: No results found for this or any previous visit (from the past 48 hour(s)).  Physical Findings: AIMS: Facial and Oral Movements Muscles of Facial Expression: None, normal Lips and Perioral Area: None, normal Jaw: None, normal Tongue: None, normal,Extremity Movements Upper (arms, wrists, hands, fingers): None, normal Lower (legs, knees, ankles, toes): None, normal, Trunk Movements Neck, shoulders, hips: None, normal, Overall Severity Severity  of abnormal movements (highest score from questions above): None, normal Incapacitation due to abnormal movements: None, normal Patient's awareness of abnormal movements (rate only patient's report): No Awareness, Dental Status Current problems with teeth and/or dentures?: No Does patient usually wear dentures?: No  CIWA:    COWS:     Assessment: Patient guarded, irritable, paranoid . Insight limited. Needing nursing encouragement and insistence for  Medication compliance.   Treatment Plan Summary: Daily contact with patient to assess and evaluate symptoms and progress in treatment Medication management  Plan:1. Continue crisis management and stabilization. 2. Medication management -  Haldol 5mg  po/IM bid for psychosis/delusions  Carbamazepine XR 200mg  po BID for mood stabilization.  Cogentin 0.5mg  po/IM bid for EPS prevention.  Ativan 1 mgr BID  Trazodone 150mg  po qhs for insomnia.  Medical Decision  Making Problem Points:  Established problem, worsening (2) and Review of last therapy session (1) Data Points:  Review or order clinical lab tests (1) Review of medication regiment & side effects (2)  I certify that inpatient services furnished can reasonably be expected to improve the patient's condition.   Nehemiah Massed, NP-C 04/22/2014, 10:36 AM
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
## 2014-04-23 MED ORDER — HALOPERIDOL LACTATE 5 MG/ML IJ SOLN: 5.0000 mg | Freq: Three times a day (TID) | INTRAMUSCULAR | Status: DC | PRN

## 2014-04-23 MED ORDER — OLANZAPINE 5 MG PO TABS
5.0000 mg | ORAL_TABLET | Freq: Every day | ORAL | Status: DC
  Administered 2014-04-23: 5 mg via ORAL
  Filled 2014-04-23 (×3): qty 1

## 2014-04-23 MED ORDER — HALOPERIDOL 5 MG PO TABS
5.0000 mg | ORAL_TABLET | Freq: Three times a day (TID) | ORAL | Status: DC | PRN
  Filled 2014-04-23: qty 1

## 2014-04-23 NOTE — [facility] Group Notes (Signed)
[facility] Group Notes:  (Clinical Social Work)  04/23/2014   11:15am-12:00pm  Summary of Progress/Problems:  The main focus of today's process group was to listen to a variety of genres of music and to identify that different types of music provoke different responses.  The patient then was able to identify personally what was soothing for them, as well as energizing.  Handouts were used to record feelings evoked, as well as how patient can personally use this knowledge in sleep habits, with depression, and with other symptoms.  The patient expressed understanding of concepts, as well as knowledge of how each type of music affected him/her and how this can be used at home as a wellness/recovery tool.  She was very interactive throughout group, responsive to all questions.  Had broad range of emotions displayed during the 45 minute group.  Type of Therapy:  Music Therapy   Participation Level:  Active  Participation Quality:  Attentive and Sharing  Affect:  Flat  Cognitive:  Oriented  Insight:  Improving  Engagement in Therapy:  Improving  Modes of Intervention:   Activity, Exploration  Ambrose MantleMareida Grossman-Orr, LCSW 04/23/2014, 12:30pm

## 2014-04-23 NOTE — Progress Notes (Signed)
Patient ID: [name], female   DOB: 05-02-1986, 28 y.o.   MRN: [id number] [facility] MD Progress Note  04/23/2014 1:57 PM MEHER KUCINSKI  MRN:  [id number] Subjective: Patient states " I'm fine"  Objective: Patient was seen  With RN and chart  reviewed.  Although still irritable and guarded, she seems better than yesterday and was more conversant and better related overall, with improved eye contact and communication. She has been refusing Carbamazepine, because she insists she is allergic, although the report is consistent with possible side effects  , rather than actual allergy. She has been compliant with haldol, but is also concerned about side effects. However, today she was much more receptive to discuss the importance of medication  Compliance and she stated she knew she had a chronic mental illness and needed to take " something" for it. Denies arm pain, and fingers are well perfused, no sensory compromise reported. No disruptive behaviors on unit- less pacing.      Diagnosis:   DSM5: Schizophrenia Disorders:  Delusional Disorder (297.1) and Brief Psychotic Disorder (298.8) Obsessive-Compulsive Disorders:   Trauma-Stressor Disorders:   Substance/Addictive Disorders:  Cannabis Use Disorder - Severe (304.30) Depressive Disorders:  Disruptive Mood Dysregulation Disorder (296.99) Total Time spent with patient: 20 minutes  Axis I: Bipolar I disorder, most recent episode (or current) manic           Delusional disorder, NOS           Cannabis use disorder Axis II: Cluster B Traits Axis III:  Past Medical History  Diagnosis Date  . Asthma   . Anxiety   . Panic attacks   . Schizophrenia   . Bipolar disorder    Axis IV: other psychosocial or environmental problems and problems related to social environment  ADL's:  Fair   Sleep: improving   Appetite:  Fair  Suicidal Ideation:  denies Homicidal Ideation:  denies AEB (as evidenced by):  Psychiatric Specialty  Exam: Physical Exam  Psychiatric: Her affect is angry and labile. Her speech is rapid and/or pressured and tangential. She is agitated, aggressive and actively hallucinating. Thought content is paranoid. Cognition and memory are normal. She expresses impulsivity.    Review of Systems  Constitutional: Negative.   HENT: Negative.   Eyes: Negative.   Respiratory: Negative.   Cardiovascular: Negative.   Gastrointestinal: Negative.   Genitourinary: Negative.   Musculoskeletal: Negative.   Skin: Negative.   Neurological: Negative.   Endo/Heme/Allergies: Negative.   Psychiatric/Behavioral: Positive for hallucinations and substance abuse. The patient is nervous/anxious and has insomnia.     Blood pressure 122/78, pulse 88, temperature 98.5 F (36.9 C), temperature source Oral, resp. rate 18, height 5\' 8"  (1.727 m), weight 83.462 kg (184 lb).Body mass index is 27.98 kg/(m^2).  General Appearance: Fairly groomed   Patent attorney::  Improved   Speech:  Normal  Volume:  Normal  Mood: less irritable today  Affect:  Labile  Thought Process:  Circumstantial and Disorganized, but to lesser degree   Orientation:  Full (Time, Place, and Person)  Thought Content: denies hallucinations, remains guarded   Suicidal Thoughts:  No- denies suicidal ideations or homicidal ideations  Homicidal Thoughts:  No  Memory: NA   Judgement:  Fair  Insight:  Fair  Psychomotor Activity:  Less pacing on unit   Concentration:  Fair  Recall:  Fair  Fund of Knowledge:Fair  Language: Good  Akathisia:  No  Handed:  Right  AIMS (if indicated):  Assets:  Manufacturing systems engineer Physical Health  Sleep:  Number of Hours: 6.75   Musculoskeletal: Strength & Muscle Tone: within normal limits Gait & Station: normal Patient leans: N/A  Current Medications: Current Facility-Administered Medications  Medication Dose Route Frequency Provider Last Rate Last Dose  . acetaminophen (TYLENOL) tablet 650 mg  650 mg Oral Q4H  PRN Shuvon Rankin, NP      . alum & mag hydroxide-simeth (MAALOX/MYLANTA) 200-200-20 MG/5ML suspension 30 mL  30 mL Oral Q4H PRN Shuvon Rankin, NP      . benztropine (COGENTIN) tablet 0.5 mg  0.5 mg Oral BID Fransisca Kaufmann, NP   0.5 mg at 04/22/14 1714   Or  . benztropine mesylate (COGENTIN) injection 0.5 mg  0.5 mg Intramuscular BID Fransisca Kaufmann, NP      . diphenhydrAMINE (BENADRYL) injection 50 mg  50 mg Intramuscular Q6H PRN Mojeed Akintayo   50 mg at 04/19/14 1615  . haloperidol (HALDOL) tablet 5 mg  5 mg Oral Q8H PRN Nehemiah Massed, MD       Or  . haloperidol lactate (HALDOL) injection 5 mg  5 mg Intramuscular Q8H PRN Nehemiah Massed, MD      . hydrOXYzine (ATARAX/VISTARIL) tablet 25 mg  25 mg Oral Q6H PRN Kerry Hough, PA-C   25 mg at 04/22/14 1252  . LORazepam (ATIVAN) injection 1 mg  1 mg Intramuscular Q6H PRN Mojeed Akintayo      . LORazepam (ATIVAN) tablet 1 mg  1 mg Oral BID Mojeed Akintayo   1 mg at 04/23/14 0749  . nicotine (NICODERM CQ - dosed in mg/24 hours) patch 21 mg  21 mg Transdermal Daily Mojeed Akintayo   21 mg at 04/23/14 0810  . OLANZapine (ZYPREXA) tablet 5 mg  5 mg Oral QHS Nehemiah Massed, MD      . ondansetron [facility]) tablet 4 mg  4 mg Oral Q8H PRN Shuvon Rankin, NP      . traZODone (DESYREL) tablet 150 mg  150 mg Oral QHS Mojeed Akintayo        Lab Results: No results found for this or any previous visit (from the past 48 hour(s)).  Physical Findings: AIMS: Facial and Oral Movements Muscles of Facial Expression: None, normal Lips and Perioral Area: None, normal Jaw: None, normal Tongue: None, normal,Extremity Movements Upper (arms, wrists, hands, fingers): None, normal Lower (legs, knees, ankles, toes): None, normal, Trunk Movements Neck, shoulders, hips: None, normal, Overall Severity Severity of abnormal movements (highest score from questions above): None, normal Incapacitation due to abnormal movements: None, normal Patient's awareness of abnormal  movements (rate only patient's report): No Awareness, Dental Status Current problems with teeth and/or dentures?: No Does patient usually wear dentures?: No  CIWA:    COWS:     Assessment: Some improvement, noticed insofar as decreasing irritability, improved eye contact, and better ability to discuss issues regarding medication and illness. Insight remains fair, but is agreeing to take a medication, although not Carbamazepine- see above- which she has been refusing   Treatment Plan Summary: Daily contact with patient to assess and evaluate symptoms and progress in treatment Medication management  Plan:1. Continue crisis management and stabilization. 2. Medication management -  Start Zyprexa 5 mgs QHS- we reviewed side effects, to include risk of metabolic issues, weight gain, and sedation.  Haldol 5mg  po/IM bid PRN  for psychosis/delusions  D/C Carbamazepine XR 200mg  po BID  As patient refusing to take   Cogentin 0.5mg  po/IM bid for EPS prevention.  Ativan  1 mgr BID  Trazodone 150mg  po qhs for insomnia.  Medical Decision Making Problem Points:  Established problem, worsening (2) and Review of last therapy session (1) Data Points:  Review of medication regiment & side effects (2) Review of new medications or change in dosage (2)  I certify that inpatient services furnished can reasonably be expected to improve the patient's condition.   Nehemiah MassedCOBOS, Benetta Maclaren, NP-C 04/23/2014, 1:57 PM

## 2014-04-23 NOTE — Progress Notes (Signed)
Did not attend group 

## 2014-04-23 NOTE — Progress Notes (Signed)
Writer has observed patient lying in bed sleeping earlier and was encouraged to have snack before her hs meds. She got up and ate snack and requested visteril for anxiety which she received. Patient requested that her trazadone for sleep not be given b/c it makes her too drowsy. She is isolative to her room and did not attend group this evening. She denies si/hi/a/v hallucinations but appears somewhat paranoid when around other peers in the dayroom. Safety maintained on unit with 15 min checks.

## 2014-04-23 NOTE — Progress Notes (Addendum)
Patient ID: [name], female   DOB: 07/17/1986, 28 y.o.   MRN: [id number] D. Patient presents with irritable mood, affect labile. [name] remains suspicious at times, and she is also very hesitant to comply with medications. This morning she refused to take her tegretol, naproxen , or cogentin, citing that she was allergic. When writer attempted to inquire to what allergy she had patient became irritable and defensive stating '' I just know I don't need that pill. I only want to take the haldol. '' She is visible in the milieu, and cooperative with redirection from staff. She denies any SI/HI but continues to endorse AH with MD present during assessment. A. Medications given as ordered. Support and encouragement provided. Discussed above information with Dr. Jama Flavorsobos. R. Patient is in no acute distress at this time. Will continue to monitor q 15 minutes for safety.

## 2014-04-24 MED ORDER — TRAZODONE HCL 100 MG PO TABS
100.0000 mg | ORAL_TABLET | Freq: Every day | ORAL | Status: DC
  Administered 2014-04-26 – 2014-04-29 (×2): 100 mg via ORAL
  Filled 2014-04-24 (×7): qty 1

## 2014-04-24 MED ORDER — OLANZAPINE 10 MG PO TABS
10.0000 mg | ORAL_TABLET | Freq: Every day | ORAL | Status: DC
  Filled 2014-04-24 (×3): qty 1

## 2014-04-24 NOTE — [facility] Group Notes (Signed)
[facility] LCSW Group Therapy  04/24/2014 1:15 pm  Type of Therapy: Process Group Therapy  Participation Level:  Active  Participation Quality:  Appropriate  Affect:  Flat  Cognitive:  Oriented  Insight:  Improving  Engagement in Group:  Limited  Engagement in Therapy:  Limited  Modes of Intervention:  Activity, Clarification, Education, Problem-solving and Support  Summary of Progress/Problems: Today's group addressed the issue of overcoming obstacles.  Patients were asked to identify their biggest obstacle post d/c that stands in the way of their on-going success, and then problem solve as to how to manage this.  [name] came in late, and participated minimally.  She said that she has no major challenges.  "I used to drink, but not anymore.  There is nothing out there that I will let get in my way."  Would not consider that she was overlooking something.  Ida Rogueorth, Jelene Albano B 04/24/2014   3:25 PM

## 2014-04-24 NOTE — Progress Notes (Signed)
Pt presents anxious this morning. Pt observed pacing, fidgety and irritable. Pt resistant to taking meds this morning, stating that she takes Haldol and Zyprexa. Pt resistant to taking Ativan but agreed to taking Zyprexa and Haldol. Pt has fight of ideas, disorganized thoughts and tangential speech.  Pt requesting discharge and continues to request to see the Psychiatrist throughout the day. Pt was evaluated by the Psychiatrist this morning and was explained the process and treatment plan for her disorder.  Medications administered as ordered per MD. Verbal support given. Pt encouraged to attend groups. 15 minute checks performed for safety.  Pt safety maintained at this time.

## 2014-04-24 NOTE — Progress Notes (Signed)
Patient ID: [name], female   DOB: 06-08-1986, 28 y.o.   MRN: [id number] [facility] MD Progress Note  04/24/2014 10:59 AM AUSTYNN PRIDMORE  MRN:  [id number] Subjective: Patient states " I think I am doing better, I like the Zyprexa and Haldol but I don't want Ativan.''  Objective: Patient was seen with RN and other treatment team members and her chart is reviewed. Patient reports decreased mood swings, irritability and paranoid. She has refused to take mood stabilizer (Carbamazepine). Patient still continues to talk to herself as if responding to internal stimuli, paces the hallway and still has a fixed delusions that Jesus is coming to visit her in the hospital. She has been compliant with Zyprexa and has not verbalized any adverse reactions.   Diagnosis:   DSM5: Schizophrenia Disorders:  Delusional Disorder (297.1) and  Psychotic Disorder (298.8) Obsessive-Compulsive Disorders:   Trauma-Stressor Disorders:   Substance/Addictive Disorders:  Cannabis Use Disorder - Severe (304.30) Depressive Disorders:  Disruptive Mood Dysregulation Disorder (296.99) Total Time spent with patient: 20 minutes  Axis I: Bipolar I disorder, most recent episode (or current) manic           Delusional disorder, NOS           Cannabis use disorder Axis II: Cluster B Traits Axis III:  Past Medical History  Diagnosis Date  . S/P left arm fracture    Axis IV: other psychosocial or environmental problems and problems related to social environment  ADL's:  Fair   Sleep: improving   Appetite:  Fair  Suicidal Ideation:  denies Homicidal Ideation:  denies AEB (as evidenced by):  Psychiatric Specialty Exam: Physical Exam  Psychiatric: Her affect is angry and labile. Her speech is rapid and/or pressured and tangential. She is agitated, aggressive and actively hallucinating. Thought content is paranoid. Cognition and memory are normal. She expresses impulsivity.    Review of Systems  Constitutional:  Negative.   HENT: Negative.   Eyes: Negative.   Respiratory: Negative.   Cardiovascular: Negative.   Gastrointestinal: Negative.   Genitourinary: Negative.   Musculoskeletal: Negative.   Skin: Negative.   Neurological: Negative.   Endo/Heme/Allergies: Negative.   Psychiatric/Behavioral: Positive for hallucinations and substance abuse. The patient is nervous/anxious and has insomnia.     Blood pressure 122/80, pulse 94, temperature 97.9 F (36.6 C), temperature source Oral, resp. rate 17, height 5\' 8"  (1.727 m), weight 83.462 kg (184 lb).Body mass index is 27.98 kg/(m^2).  General Appearance: Fairly groomed   Patent attorney::  Improved   Speech:  Normal  Volume:  Normal  Mood: less irritable today  Affect:  Labile  Thought Process:  Circumstantial and Disorganized, but to lesser degree   Orientation:  Full (Time, Place, and Person)  Thought Content: denies hallucinations, remains guarded   Suicidal Thoughts:  No- denies suicidal ideations or homicidal ideations  Homicidal Thoughts:  No  Memory: NA   Judgement:  Fair  Insight:  Fair  Psychomotor Activity:  Less pacing on unit   Concentration:  Fair  Recall:  [employer] of Knowledge:Fair  Language: Good  Akathisia:  No  Handed:  Right  AIMS (if indicated):     Assets:  Communication Skills Physical Health  Sleep:  Number of Hours: 6   Musculoskeletal: Strength & Muscle Tone: within normal limits Gait & Station: normal Patient leans: N/A  Current Medications: Current Facility-Administered Medications  Medication Dose Route Frequency Provider Last Rate Last Dose  . acetaminophen (TYLENOL) tablet 650 mg  650 mg Oral Q4H PRN Shuvon Rankin, NP   650 mg at 04/24/14 0830  . alum & mag hydroxide-simeth (MAALOX/MYLANTA) 200-200-20 MG/5ML suspension 30 mL  30 mL Oral Q4H PRN Shuvon Rankin, NP      . benztropine (COGENTIN) tablet 0.5 mg  0.5 mg Oral BID Fransisca KaufmannLaura Davis, NP   0.5 mg at 04/24/14 [id number]   Or  . benztropine mesylate  (COGENTIN) injection 0.5 mg  0.5 mg Intramuscular BID Fransisca KaufmannLaura Davis, NP      . diphenhydrAMINE (BENADRYL) injection 50 mg  50 mg Intramuscular Q6H PRN Phynix Horton   50 mg at 04/19/14 1615  . haloperidol (HALDOL) tablet 5 mg  5 mg Oral Q8H PRN Maggi Hershkowitz       Or  . haloperidol lactate (HALDOL) injection 5 mg  5 mg Intramuscular Q8H PRN Tyliyah Mcmeekin      . hydrOXYzine (ATARAX/VISTARIL) tablet 25 mg  25 mg Oral Q6H PRN Kerry HoughSpencer E Simon, PA-C   25 mg at 04/23/14 2042  . nicotine (NICODERM CQ - dosed in mg/24 hours) patch 21 mg  21 mg Transdermal Daily Lilley Hubble   21 mg at 04/24/14 0800  . OLANZapine (ZYPREXA) tablet 10 mg  10 mg Oral QHS Lavinia Mcneely      . ondansetron (ZOFRAN) tablet 4 mg  4 mg Oral Q8H PRN Shuvon Rankin, NP      . traZODone (DESYREL) tablet 100 mg  100 mg Oral QHS Baily Hovanec        Lab Results: No results found for this or any previous visit (from the past 48 hour(s)).  Physical Findings: AIMS: Facial and Oral Movements Muscles of Facial Expression: None, normal Lips and Perioral Area: None, normal Jaw: None, normal Tongue: None, normal,Extremity Movements Upper (arms, wrists, hands, fingers): None, normal Lower (legs, knees, ankles, toes): None, normal, Trunk Movements Neck, shoulders, hips: None, normal, Overall Severity Severity of abnormal movements (highest score from questions above): None, normal Incapacitation due to abnormal movements: None, normal Patient's awareness of abnormal movements (rate only patient's report): No Awareness, Dental Status Current problems with teeth and/or dentures?: No Does patient usually wear dentures?: No  CIWA:    COWS:     Assessment: Some improvement, noticed insofar as decreasing irritability, improved eye contact, and better ability to discuss issues regarding medication and illness. Insight remains fair, but is agreeing to take a medication, although not Carbamazepine- see above- which she has been  refusing   Treatment Plan Summary: Daily contact with patient to assess and evaluate symptoms and progress in treatment Medication management  Plan:1. Continue crisis management and stabilization. 2. Medication management -  -Increase  Zyprexa to 10 mgs QHS- for psychosis/delusions/mood. -Continue Haldol 5mg  po/IM bid PRN  for psychosis/delusions. -Continue Cogentin 0.5mg  po/IM bid for EPS prevention.  -Discontinue Ativan 1 mgr BID -Decrease  Trazodone to 100mg  po qhs for insomnia.  Medical Decision Making Problem Points:  Established problem, improving(1) and Review of last therapy session (1) Data Points:  Review of medication regiment & side effects (2) Review of new medications or change in dosage (2)  I certify that inpatient services furnished can reasonably be expected to improve the patient's condition.   Thedore MinsAkintayo, Moe Brier, MD 04/24/2014, 10:59 AM

## 2014-04-24 NOTE — Tx Team (Signed)
  Interdisciplinary Treatment Plan Update   Date Reviewed:  04/24/2014  Time Reviewed:  8:22 AM  Progress in Treatment:   Attending groups: Yes Participating in groups: Yes Taking medication as prescribed: Yes  Tolerating medication: Yes Family/Significant other contact made: No Patient understands diagnosis: Yes  Discussing patient identified problems/goals with staff: Yes Medical problems stabilized or resolved: Yes Denies suicidal/homicidal ideation: Yes Patient has not harmed self or others: Yes  For review of initial/current patient goals, please see plan of care.  Estimated Length of Stay:  2-3 days  Reason for Continuation of Hospitalization: Medication stabilization Other; describe mood lability  New Problems/Goals identified:  N/A  Discharge Plan or Barriers:   return to shelter, follow up outpt  Additional Comments:  [name] is now communicating her needs, and responding to questions to staff.  This is a marked improvement from last week, as recently as Friday.  She was changed from Haldol to Zyprexa yesterday as she was concerned about EPS, and Equetro discontinued as she was taking it inconsistently here, and stating that she would not take it outside of here.  Attendees:  Signature: Thedore MinsMojeed Akintayo, MD 04/24/2014 8:22 AM   Signature: Richelle Itood Janmichael Giraud, LCSW 04/24/2014 8:22 AM  Signature: Fransisca KaufmannLaura Davis, NP 04/24/2014 8:22 AM  Signature: Joslyn Devonaroline Beaudry, RN 04/24/2014 8:22 AM  Signature: Liborio NixonPatrice White, RN 04/24/2014 8:22 AM  Signature:  04/24/2014 8:22 AM  Signature:   04/24/2014 8:22 AM  Signature:    Signature:    Signature:    Signature:    Signature:    Signature:      Scribe for Treatment Team:   Richelle Itood Oneta Sigman, LCSW  04/24/2014 8:22 AM

## 2014-04-24 NOTE — Progress Notes (Signed)
The focus of this group is to help patients review their daily goal of treatment and discuss progress on daily workbooks. Pt attended the evening group, but refused to sit down or respond to any discussion prompts. Pt required redirection several times to keep from banging on the window. Pt's affect was flat.

## 2014-04-24 NOTE — [facility] Group Notes (Signed)
[facility]   04/24/2014 10:59 AM  Participation Quality:  Engaged  Mood/Affect:  Flat  Depression Rating:  1  Anxiety Rating:  8  Thoughts of Suicide:  No Will you contract for safety?   NA  Current AVH:  Denies  Plan for Discharge/Comments:   "I plan on returning to the shelter when I leave.  Would you please call to make sure I have a bed there?"  Assured her I would call today.  Also stated that she would follow up with [facility].  Transportation Means: bus  Supports: minimal  [place], SchlusserRodney B

## 2014-04-25 NOTE — Progress Notes (Signed)
Pt presents anxious this morning. Pt continues to pace the hallway talking to herself. Pt paranoid and suspicious about taking meds. Pt requires prompting when administering meds. Pt has poor insight for treatment and continues to request for discharge. Pt requires redirecting for her bizarre behaviors and paranoia. Pt remains delusional. Medications administered as ordered per MD. Verbal support given. Pt encourage to attend groups. 15 minute checks performed for safety.  Pt safety maintained at this time.

## 2014-04-25 NOTE — Progress Notes (Signed)
Adult Psychoeducational Group Note  Date:  04/25/2014 Time:  9:10 PM  Group Topic/Focus:  Wrap-Up Group:   The focus of this group is to help patients review their daily goal of treatment and discuss progress on daily workbooks.  Participation Level:  Did Not Attend  Participation Quality:  Did not attend  Affect:  Did not attend  Cognitive:  Did not attend  Insight: None  Engagement in Group:  Did not attend  Modes of Intervention:  Did not attend  Additional Comments:  Patient did not attend group   Scot DockFrancis, Morrissa Shein Dacosta 04/25/2014, 9:10 PM

## 2014-04-25 NOTE — Progress Notes (Signed)
Patient ID: [name], female   DOB: 20-Nov-1985, 28 y.o.   MRN: [id number] [facility] MD Progress Note  04/25/2014 4:48 PM [name]  MRN:  [id number] Subjective: Patient stated initially that she did not want to speak to any NP or MD today. Pt reports "I'm glad you came to find me and talk to me. I was really upset. I just didn't know what to say. I just want to get out of here and go home. I spoke to Rod, the Child psychotherapist and he is helping me with finding a place I can go when I leave here".   Objective: Patient denies SI, HI, and AVH, contracts for safety. However, her presentation and objective findings contradict what she says.  Patient still continues to talk to herself as if responding to internal stimuli, paces the hallway and still has a fixed delusions that Jesus is coming to visit her in the hospital. She has been compliant with Zyprexa and has not verbalized any adverse reactions.   Diagnosis:   DSM5: Schizophrenia Disorders:  Delusional Disorder (297.1) and  Psychotic Disorder (298.8) Obsessive-Compulsive Disorders:   Trauma-Stressor Disorders:   Substance/Addictive Disorders:  Cannabis Use Disorder - Severe (304.30) Depressive Disorders:  Disruptive Mood Dysregulation Disorder (296.99) Total Time spent with patient: 25 minutes  Axis I: Bipolar I disorder, most recent episode (or current) manic           Delusional disorder, NOS           Cannabis use disorder Axis II: Cluster B Traits Axis III:  Past Medical History  Diagnosis Date  . S/P left arm fracture    Axis IV: other psychosocial or environmental problems and problems related to social environment  ADL's:  Fair   Sleep: improving   Appetite:  Fair  Suicidal Ideation:  denies Homicidal Ideation:  denies AEB (as evidenced by):  Psychiatric Specialty Exam: Physical Exam  Psychiatric: Her affect is angry and labile. Her speech is rapid and/or pressured and tangential. She is agitated, aggressive  and actively hallucinating. Thought content is paranoid. Cognition and memory are normal. She expresses impulsivity.    Review of Systems  Constitutional: Negative.   HENT: Negative.   Eyes: Negative.   Respiratory: Negative.   Cardiovascular: Negative.   Gastrointestinal: Negative.   Genitourinary: Negative.   Musculoskeletal: Negative.   Skin: Negative.   Neurological: Negative.   Endo/Heme/Allergies: Negative.   Psychiatric/Behavioral: Positive for hallucinations and substance abuse. The patient is nervous/anxious and has insomnia.     Blood pressure 122/80, pulse 94, temperature 97.9 F (36.6 C), temperature source Oral, resp. rate 17, height 5\' 8"  (1.727 m), weight 83.462 kg (184 lb).Body mass index is 27.98 kg/(m^2).  General Appearance: Fairly groomed   Patent attorney::  Improved   Speech:  Normal  Volume:  Normal  Mood: less irritable today  Affect:  Labile  Thought Process:  Circumstantial and Disorganized, but to lesser degree   Orientation:  Full (Time, Place, and Person)  Thought Content: denies hallucinations, remains guarded   Suicidal Thoughts:  No- denies suicidal ideations or homicidal ideations  Homicidal Thoughts:  No  Memory: NA   Judgement:  Fair  Insight:  Fair  Psychomotor Activity:  Less pacing on unit   Concentration:  Fair  Recall:  Fair  Fund of Knowledge:Fair  Language: Good  Akathisia:  No  Handed:  Right  AIMS (if indicated):     Assets:  Communication Skills Physical Health  Sleep:  Number  of Hours: 4.5   Musculoskeletal: Strength & Muscle Tone: within normal limits Gait & Station: normal Patient leans: N/A  Current Medications: Current Facility-Administered Medications  Medication Dose Route Frequency Provider Last Rate Last Dose  . acetaminophen (TYLENOL) tablet 650 mg  650 mg Oral Q4H PRN Shuvon Rankin, NP   650 mg at 04/24/14 1632  . alum & mag hydroxide-simeth (MAALOX/MYLANTA) 200-200-20 MG/5ML suspension 30 mL  30 mL Oral Q4H PRN  Shuvon Rankin, NP      . benztropine (COGENTIN) tablet 0.5 mg  0.5 mg Oral BID Fransisca KaufmannLaura Davis, NP   0.5 mg at 04/25/14 [id number]   Or  . benztropine mesylate (COGENTIN) injection 0.5 mg  0.5 mg Intramuscular BID Fransisca KaufmannLaura Davis, NP      . diphenhydrAMINE (BENADRYL) injection 50 mg  50 mg Intramuscular Q6H PRN Kevron Patella   50 mg at 04/19/14 1615  . haloperidol (HALDOL) tablet 5 mg  5 mg Oral Q8H PRN Shasha Buchbinder       Or  . haloperidol lactate (HALDOL) injection 5 mg  5 mg Intramuscular Q8H PRN Militza Devery      . hydrOXYzine (ATARAX/VISTARIL) tablet 25 mg  25 mg Oral Q6H PRN Kerry HoughSpencer E Simon, PA-C   25 mg at 04/24/14 2146  . nicotine (NICODERM CQ - dosed in mg/24 hours) patch 21 mg  21 mg Transdermal Daily Artavius Stearns   21 mg at 04/25/14 0800  . OLANZapine (ZYPREXA) tablet 10 mg  10 mg Oral QHS Shaquel Josephson      . ondansetron (ZOFRAN) tablet 4 mg  4 mg Oral Q8H PRN Shuvon Rankin, NP      . traZODone (DESYREL) tablet 100 mg  100 mg Oral QHS Jayzen Paver        Lab Results: No results found for this or any previous visit (from the past 48 hour(s)).  Physical Findings: AIMS: Facial and Oral Movements Muscles of Facial Expression: None, normal Lips and Perioral Area: None, normal Jaw: None, normal Tongue: None, normal,Extremity Movements Upper (arms, wrists, hands, fingers): None, normal Lower (legs, knees, ankles, toes): None, normal, Trunk Movements Neck, shoulders, hips: None, normal, Overall Severity Severity of abnormal movements (highest score from questions above): None, normal Incapacitation due to abnormal movements: None, normal Patient's awareness of abnormal movements (rate only patient's report): No Awareness, Dental Status Current problems with teeth and/or dentures?: No Does patient usually wear dentures?: No  CIWA:    COWS:     Assessment: Some improvement, noticed insofar as decreasing irritability, improved eye contact, and better ability to discuss issues  regarding medication and illness. Insight remains fair, but is agreeing to take a medication, although not Carbamazepine- see above- which she has been refusing   Treatment Plan Summary: Daily contact with patient to assess and evaluate symptoms and progress in treatment Medication management  Plan:1. Continue crisis management and stabilization. 2. Medication management -  -Continue Zyprexa to 10 mgs QHS- for psychosis/delusions/mood. -Continue Haldol 5mg  po/IM bid PRN  for psychosis/delusions. -Continue Cogentin 0.5mg  po/IM bid for EPS prevention.  -Discontinue Ativan 1 mgr BID -Continue Trazodone to 100mg  po qhs for insomnia.  Medical Decision Making Problem Points:  Established problem, improving(1) and Review of last therapy session (1) Data Points:  Review of medication regiment & side effects (2) Review of new medications or change in dosage (2)  I certify that inpatient services furnished can reasonably be expected to improve the patient's condition.   Beau FannyWithrow, John C, FNP-BC 04/25/2014, 4:48 PM  Patient seen, evaluated and I agree with notes by Nurse Practitioner. Cambre Matson, MD 

## 2014-04-25 NOTE — [facility] Group Notes (Signed)
[facility] LCSW Group Therapy  04/25/2014 , 12:35 PM   Type of Therapy:  Group Therapy  Participation Level:  Active  Participation Quality:  Attentive  Affect:  Appropriate  Cognitive:  Alert  Insight:  Improving  Engagement in Therapy:  Engaged  Modes of Intervention:  Discussion, Exploration and Socialization  Summary of Progress/Problems: Today's group focused on the term Diagnosis.  Participants were asked to define the term, and then pronounce whether it is a negative, positive or neutral term.  [name] stayed for most of group.  She left once or twice, and spent some time pacing during group.  She had several contributions to make, but all were rather cryptic and difficult to understand.  "I see positive everywhere, and I surround myself with positives."  Had nothing concrete to add to the discussion at hand.  Daryel Geraldorth, Pierrette Scheu B 04/25/2014 , 12:35 PM

## 2014-04-26 MED ORDER — OLANZAPINE 7.5 MG PO TABS
15.0000 mg | ORAL_TABLET | Freq: Every day | ORAL | Status: DC
  Administered 2014-04-26 – 2014-05-01 (×4): 15 mg via ORAL
  Filled 2014-04-26 (×5): qty 2
  Filled 2014-04-26: qty 28
  Filled 2014-04-26 (×5): qty 2

## 2014-04-26 MED ORDER — HALOPERIDOL LACTATE 5 MG/ML IJ SOLN
INTRAMUSCULAR | Status: AC
  Filled 2014-04-26: qty 2

## 2014-04-26 MED ORDER — HALOPERIDOL LACTATE 5 MG/ML IJ SOLN
10.0000 mg | Freq: Three times a day (TID) | INTRAMUSCULAR | Status: DC | PRN
  Administered 2014-04-26: 10 mg via INTRAMUSCULAR

## 2014-04-26 MED ORDER — OLANZAPINE 10 MG PO TABS: 10.0000 mg | ORAL_TABLET | Freq: Every day | ORAL | Status: DC

## 2014-04-26 MED ORDER — DIVALPROEX SODIUM 500 MG PO DR TAB
500.0000 mg | DELAYED_RELEASE_TABLET | Freq: Two times a day (BID) | ORAL | Status: DC
Start: 2014-04-26 — End: 2014-05-02
  Administered 2014-04-26 – 2014-05-02 (×11): 500 mg via ORAL
  Filled 2014-04-26 (×3): qty 1
  Filled 2014-04-26: qty 28
  Filled 2014-04-26 (×11): qty 1
  Filled 2014-04-26: qty 28
  Filled 2014-04-26 (×3): qty 1

## 2014-04-26 MED ORDER — BENZTROPINE MESYLATE 0.5 MG PO TABS: 0.5000 mg | ORAL_TABLET | Freq: Two times a day (BID) | ORAL | Status: DC

## 2014-04-26 MED ORDER — DIPHENHYDRAMINE HCL 50 MG/ML IJ SOLN
50.0000 mg | Freq: Three times a day (TID) | INTRAMUSCULAR | Status: DC | PRN
  Administered 2014-04-26: 50 mg via INTRAMUSCULAR

## 2014-04-26 MED ORDER — HYDROXYZINE HCL 25 MG PO TABS: 25.0000 mg | ORAL_TABLET | Freq: Four times a day (QID) | ORAL | Status: DC | PRN

## 2014-04-26 MED ORDER — DIPHENHYDRAMINE HCL 50 MG/ML IJ SOLN
INTRAMUSCULAR | Status: AC
  Filled 2014-04-26: qty 1

## 2014-04-26 MED ORDER — HALOPERIDOL 5 MG PO TABS
10.0000 mg | ORAL_TABLET | Freq: Three times a day (TID) | ORAL | Status: DC | PRN
  Administered 2014-04-28 – 2014-04-29 (×2): 10 mg via ORAL
  Filled 2014-04-26 (×4): qty 2

## 2014-04-26 NOTE — [facility] Suicide Risk Assessment (Signed)
[facility] INPATIENT:  Family/Significant Other Suicide Prevention Education  Suicide Prevention Education:  Education Completed; No one has been identified by the patient as the family member/significant other with whom the patient will be residing, and identified as the person(s) who will aid the patient in the event of a mental health crisis (suicidal ideations/suicide attempt).  With written consent from the patient, the family member/significant other has been provided the following suicide prevention education, prior to the and/or following the discharge of the patient.  The suicide prevention education provided includes the following:  Suicide risk factors  Suicide prevention and interventions  National Suicide Hotline telephone number  [facility] assessment telephone number  [facility] 911  [facility] and/or [facility]idential Mobile Crisis Unit telephone number  Request made of family/significant other to:  Remove weapons (e.g., guns, rifles, knives), all items previously/currently identified as safety concern.    Remove drugs/medications (over-the-counter, prescriptions, illicit drugs), all items previously/currently identified as a safety concern.  The family member/significant other verbalizes understanding of the suicide prevention education information provided.  The family member/significant other agrees to remove the items of safety concern listed above. The patient did not endorse SI at the time of admission, nor did the patient c/o SI during the stay here.  SPE not required.   I inquired as to whom I could talk to that is a support of hers.  She stated there is no one.   Daryel Geraldorth, Samanatha Brammer B 04/26/2014, 10:01 AM

## 2014-04-26 NOTE — [facility] Suicide Risk Assessment (Signed)
   Demographic Factors:  Low socioeconomic status, Unemployed and homeless  Total Time spent with patient: 20 minutes  Psychiatric Specialty Exam: Physical Exam  Psychiatric: She has a normal mood and affect. Her speech is normal and behavior is normal. Judgment and thought content normal. Cognition and memory are normal.    Review of Systems  Constitutional: Negative.   HENT: Negative.   Eyes: Negative.   Respiratory: Negative.   Cardiovascular: Negative.   Gastrointestinal: Negative.   Genitourinary: Negative.   Musculoskeletal: Negative.   Skin: Negative.   Neurological: Negative.   Endo/Heme/Allergies: Negative.   Psychiatric/Behavioral: Negative.     Blood pressure 122/80, pulse 94, temperature 97.9 F (36.6 C), temperature source Oral, resp. rate 17, height 5\' 8"  (1.727 m), weight 83.462 kg (184 lb).Body mass index is 27.98 kg/(m^2).  General Appearance: Fairly Groomed  Patent attorneyye Contact::  Good  Speech:  Clear and Coherent and Normal Rate  Volume:  Normal  Mood:  Euthymic  Affect:  Congruent  Thought Process:  Goal Directed  Orientation:  Full (Time, Place, and Person)  Thought Content:  Negative  Suicidal Thoughts:  No  Homicidal Thoughts:  No  Memory:  Immediate;   Fair Recent;   Fair Remote;   Fair  Judgement:  Fair  Insight:  marginal  Psychomotor Activity:  Normal  Concentration:  Fair  Recall:  FiservFair  Fund of Knowledge:Fair  Language: Good  Akathisia:  No  Handed:  Right  AIMS (if indicated):     Assets:  Communication Skills Desire for Improvement Physical Health  Sleep:  Number of Hours: 0    Musculoskeletal: Strength & Muscle Tone: within normal limits Gait & Station: normal Patient leans: N/A   Mental Status Per Nursing Assessment::   On Admission:     Current Mental Status by Physician: patient denies suicidal ideation, intent or plan  Loss Factors: Financial problems/change in socioeconomic status  Historical Factors: poor impulse  control  Risk Reduction Factors:   NA  Continued Clinical Symptoms:  Resolving agitation and delusions  Cognitive Features That Contribute To Risk:  Closed-mindedness Polarized thinking    Suicide Risk:  Minimal: No identifiable suicidal ideation.  Patients presenting with no risk factors but with morbid ruminations; may be classified as minimal risk based on the severity of the depressive symptoms  Discharge Diagnoses:   AXIS I:  Bipolar I disorder, most recent episode (or current) manic              Cannabis use disorder AXIS II:  Cluster B Traits AXIS III:   Past Medical History  Diagnosis Date  . Asthma    AXIS IV:  other psychosocial or environmental problems and problems related to social environment AXIS V:  61-70 mild symptoms  Plan Of Care/Follow-up recommendations:  Activity:  as tolerated Diet:  healthy Tests:  routine Other:  patient to keep her after care appointment  Is patient on multiple antipsychotic therapies at discharge:  No   Has Patient had three or more failed trials of antipsychotic monotherapy by history:  No  Recommended Plan for Multiple Antipsychotic Therapies: NA    Thedore MinsAkintayo, Rhian Funari, MD 04/26/2014, 9:27 AM

## 2014-04-26 NOTE — Progress Notes (Addendum)
[facility] :  Will you be returning to the same living situation after discharge: No. Pt [facility] us from jail.  She states [facility].  They did not have a female bed as of this AM at 9. At discharge, do you have transportation home?:Yes,  bus pass Do you have the ability to pay for your medications:Yes,  mental health  Release of information consent forms completed and in the chart;  Patient's signature needed at discharge.  Patient to Follow up at: Follow-up Information   Follow up with [facility]. (Go to the walk-in clinic M-F between 8 and 10 AM for your hospital follow up appointment)    Contact information:   [address]  [place] [336] [phone]      Patient denies SI/HI:   Yes,  yes     Safety Planning and Suicide Prevention discussed:  Yes,  yes  Daryel Geraldorth, [name] 04/26/2014, 10:04 AM

## 2014-04-26 NOTE — Progress Notes (Signed)
Pt observed lying in bed resting with her eyes closed. resp are even and unlabored. No distress noted.

## 2014-04-26 NOTE — Progress Notes (Signed)
Patient ID: [name], female   DOB: 1986-08-16, 28 y.o.   MRN: [id number] [facility] MD Progress Note  04/26/2014 11:23 AM [name]  MRN:  [id number] Subjective/Objective: Patient was scheduled to be discharged today. However, she refused all her medications and states that she is not going to take them upon discharge. Patient has been getting easily agitated, labile, angry and irritable. She is verbalizing delusional thoughts, saying we are trying to poison her body with the medications offered to her. Patient has no insight into her mental illness and showing poor judgment. She has refused virtually all medications offered to her except Zyprexa. Patient cannot be discharged today, will be kept until her symptoms resolved.  Diagnosis:   DSM5: Schizophrenia Disorders:  Delusional Disorder (297.1) and  Psychotic Disorder (298.8) Obsessive-Compulsive Disorders:   Trauma-Stressor Disorders:   Substance/Addictive Disorders:  Cannabis Use Disorder - Severe (304.30) Depressive Disorders:  Disruptive Mood Dysregulation Disorder (296.99) Total Time spent with patient: 20 minutes  Axis I: Bipolar I disorder, most recent episode (or current) manic           Delusional disorder, NOS           Cannabis use disorder Axis II: Cluster B Traits Axis III:  Past Medical History  Diagnosis Date  . S/P left arm fracture    Axis IV: other psychosocial or environmental problems and problems related to social environment  ADL's:  Fair   Sleep: improving   Appetite:  Fair  Suicidal Ideation:  denies Homicidal Ideation:  denies AEB (as evidenced by):  Psychiatric Specialty Exam: Physical Exam  Psychiatric: Her affect is angry and labile. Her speech is rapid and/or pressured and tangential. She is agitated, aggressive and actively hallucinating. Thought content is paranoid. Cognition and memory are normal. She expresses impulsivity.    Review of Systems  Constitutional: Negative.   HENT:  Negative.   Eyes: Negative.   Respiratory: Negative.   Cardiovascular: Negative.   Gastrointestinal: Negative.   Genitourinary: Negative.   Musculoskeletal: Negative.   Skin: Negative.   Neurological: Negative.   Endo/Heme/Allergies: Negative.   Psychiatric/Behavioral: Positive for hallucinations and substance abuse. The patient is nervous/anxious and has insomnia.     Blood pressure 122/80, pulse 94, temperature 97.9 F (36.6 C), temperature source Oral, resp. rate 17, height 5\' 8"  (1.727 m), weight 83.462 kg (184 lb).Body mass index is 27.98 kg/(m^2).  General Appearance: Fairly groomed   Patent attorney::  Improved   Speech:  Hyper verbal  Volume:  increased  Mood:  irritable today  Affect:  Labile  Thought Process:  Circumstantial and Disorganized,   Orientation:  Full (Time, Place, and Person)  Thought Content: paranoid delusions  Suicidal Thoughts:  No-   Homicidal Thoughts:  No  Memory: NA   Judgement: poor  Insight:  poor  Psychomotor Activity:  Less pacing on unit   Concentration:  Fair  Recall:  Fair  Fund of Knowledge:Fair  Language: Good  Akathisia:  No  Handed:  Right  AIMS (if indicated):     Assets:  Communication Skills Physical Health  Sleep:  Number of Hours: 0   Musculoskeletal: Strength & Muscle Tone: within normal limits Gait & Station: normal Patient leans: N/A  Current Medications: Current Facility-Administered Medications  Medication Dose Route Frequency Provider Last Rate Last Dose  . acetaminophen (TYLENOL) tablet 650 mg  650 mg Oral Q4H PRN Shuvon Rankin, NP   650 mg at 04/24/14 1632  . alum & mag hydroxide-simeth (MAALOX/MYLANTA)  200-200-20 MG/5ML suspension 30 mL  30 mL Oral Q4H PRN Shuvon Rankin, NP      . diphenhydrAMINE (BENADRYL) injection 50 mg  50 mg Intramuscular Q8H PRN Claiborne Stroble      . haloperidol (HALDOL) tablet 10 mg  10 mg Oral Q8H PRN Delaine Canter       Or  . haloperidol lactate (HALDOL) injection 10 mg  10 mg  Intramuscular Q8H PRN Lacara Dunsworth      . hydrOXYzine (ATARAX/VISTARIL) tablet 25 mg  25 mg Oral Q6H PRN Kerry HoughSpencer E Simon, PA-C   25 mg at 04/26/14 0937  . nicotine (NICODERM CQ - dosed in mg/24 hours) patch 21 mg  21 mg Transdermal Daily Quida Glasser   21 mg at 04/25/14 0800  . OLANZapine (ZYPREXA) tablet 15 mg  15 mg Oral QHS Fizza Scales      . ondansetron (ZOFRAN) tablet 4 mg  4 mg Oral Q8H PRN Shuvon Rankin, NP      . traZODone (DESYREL) tablet 100 mg  100 mg Oral QHS Abella Shugart        Lab Results: No results found for this or any previous visit (from the past 48 hour(s)).  Physical Findings: AIMS: Facial and Oral Movements Muscles of Facial Expression: None, normal Lips and Perioral Area: None, normal Jaw: None, normal Tongue: None, normal,Extremity Movements Upper (arms, wrists, hands, fingers): None, normal Lower (legs, knees, ankles, toes): None, normal, Trunk Movements Neck, shoulders, hips: None, normal, Overall Severity Severity of abnormal movements (highest score from questions above): None, normal Incapacitation due to abnormal movements: None, normal Patient's awareness of abnormal movements (rate only patient's report): No Awareness, Dental Status Current problems with teeth and/or dentures?: No Does patient usually wear dentures?: No  CIWA:    COWS:     Assessment: Some improvement, noticed insofar as decreasing irritability, improved eye contact, and better ability to discuss issues regarding medication and illness. Insight remains fair, but is agreeing to take a medication, although not Carbamazepine- see above- which she has been refusing   Treatment Plan Summary: Daily contact with patient to assess and evaluate symptoms and progress in treatment Medication management  Plan:1. Continue crisis management and stabilization. 2. Medication management -  -Increase  Zyprexa to 15 mgs QHS- for psychosis/delusions/mood. -Increase Haldol to 10mg  po/IM  tid PRN  For agitation/ psychosis/delusions. -Increase  Cogentin 1mg  po/IM tid for EPS prevention.  -Initiate Depakote 500mg  po bid for mood stabilization -Continue Trazodone to 100mg  po qhs for insomnia.  Medical Decision Making Problem Points:  Established problem, worsening(2) and Review of last therapy session (1) Data Points:  Review of medication regiment & side effects (2) Review of new medications or change in dosage (2)  I certify that inpatient services furnished can reasonably be expected to improve the patient's condition.   Thedore MinsAkintayo, Gustava Berland, MD 04/26/2014, 11:23 AM

## 2014-04-26 NOTE — Progress Notes (Signed)
Pt easily agitated this morning and irritable on approach. Pt continues to refuse meds when offered. Pt observed pacing hallways and appears to be responding to internal stimuli. Pt has poor insight for treatment. Pt remains delusional, thinking that people are trying to harm her. Pt stated that the meds are messing with her mind and she can't take them anymore. During rec therapy, pt became agitated while standing in line and staff was unable to redirect pt on initial approach. Pt eventually walked backed onto the unit were she agreed to receive prn meds for agitation. Medications administered as ordered per MD. Verbal support given. Pt encouraged to attend groups. Prn meds given do to agitation. 15 minute checks performed for safety.  Pt safety maintained.

## 2014-04-26 NOTE — Progress Notes (Signed)
Adult Psychoeducational Group Note  Date:  04/26/2014 Time:  9:24 PM  Group Topic/Focus:  Wrap-Up Group:   The focus of this group is to help patients review their daily goal of treatment and discuss progress on daily workbooks.  Participation Level:  Did Not Attend  Participation Quality:  Did not attend  Affect:  Did not attend  Cognitive:  Did not attend  Insight: Limited  Engagement in Group:  Did not attend  Modes of Intervention:  Did not attend  Additional Comments:  Patient did not attend   Scot DockFrancis, Zadrian Mccauley Dacosta 04/26/2014, 9:24 PM

## 2014-04-26 NOTE — Progress Notes (Signed)
Adult Psychoeducational Group Note  Date:  04/26/2014 Time:  2:46 PM  Group Topic/Focus:  Personal Choices and Values:   The focus of this group is to help patients assess and explore the importance of values in their lives, how their values affect their decisions, how they express their values and what opposes their expression.  Participation Level:  Did Not Attend  Additional Comments:  Patient was informed that group was starting and encouraged to come.  Merleen MillinerCataldo, Laiklynn Raczynski Y 04/26/2014, 2:46 PM

## 2014-04-26 NOTE — Progress Notes (Signed)
D   Pt spent most of the shift pacing the halls and she refused her medication at bedtime   She was in her room flushing her toilet constantly and needed to be redirected   She laughed about same   Prior to that she had a very anxious and worried expression on her face and would not stop walking long enough to have a conversation   She did say her arm was not hurting her any   She does not interact with others and does not go to groups A   Verbal support and redirection given   Encouraged medications and tried to instruct on theraputic use and how compliance would aid in her recovery and discharge quicker   Q 15 min checks R  Pt safe but said she doesn't need medication so would not take any

## 2014-04-26 NOTE — Progress Notes (Signed)
Pt has been agitated and awake all night   She marked with pencil a cross over her door top  She continues to ask if the cast can be taken off and is unable to process why she needs to keep the cast on   Encouraged her to take pain medications and medication to help her sleep but pt refuses same   Will continue to monitor for agitation and pt being a danger to herself or others    Will administered emergency medication if warranted by her behavior

## 2014-04-27 MED ORDER — ENSURE COMPLETE PO LIQD
237.0000 mL | Freq: Two times a day (BID) | ORAL | Status: DC
  Administered 2014-04-29 – 2014-05-01 (×6): 237 mL via ORAL

## 2014-04-27 NOTE — Progress Notes (Signed)
Patient ID: Jeanne FewJennifer [name] XXXKeene, female   DOB: 03/28/1986, 28 y.o.   MRN: [id number] [name]: Patient has been lying in bed today.  She is lethargic and drowsy.  Her mood remains labile and she is easily agitated.  She responds to internal stimuli. She is observed talking to herself.  Her thoughts are disorganized; speech tangental.  Unable to assess for SI/HI.  A: Continue to monitor medication management and MD orders.  Safety checks completed every 15 minutes per protocol.  R: patient remains isolative to room.

## 2014-04-27 NOTE — Progress Notes (Signed)
NUTRITION ASSESSMENT  Pt identified as at risk on the Malnutrition Screen Tool  INTERVENTION: Ensure Complete BID PRN when pt eats <50% of meals   NUTRITION DIAGNOSIS: Inadequate oral intake related to pt lethargic as evidenced by staff report.   Goal: Pt to meet >/= 90% of their estimated nutrition needs.  Monitor:  PO intake  Assessment:  Admitted for psychosis, hx of schizophrenia. She remains paranoid, delusional, easily agitated, labile, angry psychotic per MD progress note today.   - Per nursing, pt has been asleep all day - Staff reports pt ate most of her meals yesterday   28 y.o. female  Height: Ht Readings from Last 1 Encounters:  04/18/14 5\' 8"  (1.727 m)    Weight: Wt Readings from Last 1 Encounters:  04/18/14 184 lb (83.462 kg)    Weight Hx: Wt Readings from Last 10 Encounters:  04/18/14 184 lb (83.462 kg)  05/10/13 200 lb (90.719 kg)    BMI:  Body mass index is 27.98 kg/(m^2). Pt meets criteria for overweight based on current BMI.  Estimated Nutritional Needs: Kcal: 20-25 kcal/kg Protein: > 1 gram protein/kg Fluid: 1 ml/kcal  Diet Order: General Pt is also offered choice of unit snacks mid-morning and mid-afternoon.  Pt is eating as desired.   Lab results and medications reviewed.   Charlott RakesHeather Johnye Kist MS, RD, LDN [phone] Pager [phone] Weekend/After Hours Pager

## 2014-04-27 NOTE — Progress Notes (Signed)
Patient ID: MEIKO IVES, female   DOB: Jun 02, 1986, 28 y.o.   MRN: [id number] D: Patient in bed sleeping on approach. Pt paranoid about taking medications and had to be encouraged to do so. Pt is isolative in room and not interacting with peers. Cooperative with assessment. No acute distressed noted at this time.   A: Met with pt 1:1. Medications administered as prescribed. Writer encouraged pt to discuss feelings. Pt encouraged to come to staff with any questions or concerns.   R: Patient is safe on the unit. She is complaint with medications and denies any adverse reaction. Continue current POC.

## 2014-04-27 NOTE — Progress Notes (Signed)
D: Pt presents with irritable affect and mood when engaged by staff.  No interactions with peers.  Pt has been in bed in her room for the entire shift so far.  Pt refused HS medications and ensure.  Pt states "I don't need any meds.  Why should I have to take meds?"  Pt denies SI/HI.  Denies AH/VH. A: Encouraged pt to take HS medications and educated pt on why medication compliance is important.  Safety maintained.  Encouraged pt to participate in groups and to report any concerns to staff.   R: Pt continued to refuse medications, stating "I'm already tired.  My mood is fine.  I don't need any medications and I'm not going to take them.  I just want to sleep."  Pt irritated with staff for encouraging medication compliance.  Pt went back to sleep after staff left room.  Will continue to monitor for safety.

## 2014-04-27 NOTE — Progress Notes (Signed)
Patient ID: [name], female   DOB: 07/18/1986, [age] y.o.   MRN: [id number] [facility]  04/27/2014 10:32 AM [name]  MRN:  [id number] Subjective/Objective: Patient seen and her chart reviewed. Patient continues to show poor insight into her mental illness as evidenced by her inability to recognize that she needs help for her mental illness. She remains paranoid, delusional, easily agitated, labile, angry psychotic. She has been talking to herself as if responding to internal stimuli. Patient still reporting that she is waiting for Jesus christ to arrive and thinks we are trying to poison her body with the medications offered to her.   Diagnosis:   DSM5: Schizophrenia Disorders:  Delusional Disorder (297.1) and  Psychotic Disorder (298.8) Obsessive-Compulsive Disorders:   Trauma-Stressor Disorders:   Substance/Addictive Disorders:  Cannabis Use Disorder - Severe (304.30) Depressive Disorders:  Disruptive Mood Dysregulation Disorder (296.99) Total Time spent with patient: 25 minutes  Axis I: Bipolar I disorder, most recent episode (or current) manic           Delusional disorder, NOS           Cannabis use disorder Axis II: Cluster B Traits Axis III:  Past Medical History  Diagnosis Date  . S/P left arm fracture    Axis IV: other psychosocial or environmental problems and problems related to social environment  ADL's:  Fair   Sleep: improving   Appetite:  Fair  Suicidal Ideation:  denies Homicidal Ideation:  denies AEB (as evidenced by):  Psychiatric Specialty Exam: Physical Exam  Psychiatric: Her affect is angry and labile. Her speech is rapid and/or pressured and tangential. She is agitated, aggressive and actively hallucinating. Thought content is paranoid. Cognition and memory are normal. She expresses impulsivity.    Review of Systems  Constitutional: Negative.   HENT: Negative.   Eyes: Negative.   Respiratory: Negative.   Cardiovascular:  Negative.   Gastrointestinal: Negative.   Genitourinary: Negative.   Musculoskeletal: Negative.   Skin: Negative.   Neurological: Negative.   Endo/Heme/Allergies: Negative.   Psychiatric/Behavioral: Positive for hallucinations and substance abuse. The patient is nervous/anxious and has insomnia.     Blood pressure 138/92, pulse 99, temperature 97.2 F (36.2 C), temperature source Oral, resp. rate 18, height 5\' 8"  (1.727 m), weight 83.462 kg (184 lb).Body mass index is 27.98 kg/(m^2).  General Appearance: Fairly groomed   Patent attorneyye Contact::  Improved   Speech:  Hyper verbal  Volume:  increased  Mood:  irritable today  Affect:  Labile  Thought Process:  Circumstantial and Disorganized,   Orientation:  Full (Time, Place, and Person)  Thought Content: paranoid delusions  Suicidal Thoughts:  No-   Homicidal Thoughts:  No  Memory: NA   Judgement: poor  Insight:  poor  Psychomotor Activity:  Less pacing on unit   Concentration:  Fair  Recall:  Fair  Fund of Knowledge:Fair  Language: Good  Akathisia:  No  Handed:  Right  AIMS (if indicated):     Assets:  Communication Skills Physical Health  Sleep:  Number of Hours: 6.75   Musculoskeletal: Strength & Muscle Tone: within normal limits Gait & Station: normal Patient leans: N/A  Current Medications: Current Facility-Administered Medications  Medication Dose Route Frequency Provider Last Rate Last Dose  . acetaminophen (TYLENOL) tablet 650 mg  650 mg Oral Q4H PRN Shuvon Rankin, NP   650 mg at 04/24/14 1632  . alum & mag hydroxide-simeth (MAALOX/MYLANTA) 200-200-20 MG/5ML suspension 30 mL  30 mL Oral  Q4H PRN Shuvon Rankin, NP      . diphenhydrAMINE (BENADRYL) injection 50 mg  50 mg Intramuscular Q8H PRN Terez Freimark   50 mg at 04/26/14 1141  . divalproex (DEPAKOTE) DR tablet 500 mg  500 mg Oral BID PC Latasha Puskas   500 mg at 04/27/14 1610  . haloperidol (HALDOL) tablet 10 mg  10 mg Oral Q8H PRN Shanya Ferriss       Or  .  haloperidol lactate (HALDOL) injection 10 mg  10 mg Intramuscular Q8H PRN Rubee Vega   10 mg at 04/26/14 1142  . hydrOXYzine (ATARAX/VISTARIL) tablet 25 mg  25 mg Oral Q6H PRN Kerry Hough, PA-C   25 mg at 04/26/14 0937  . nicotine (NICODERM CQ - dosed in mg/24 hours) patch 21 mg  21 mg Transdermal Daily Beva Remund   21 mg at 04/25/14 0800  . OLANZapine (ZYPREXA) tablet 15 mg  15 mg Oral QHS Torryn Fiske   15 mg at 04/26/14 2126  . ondansetron (ZOFRAN) tablet 4 mg  4 mg Oral Q8H PRN Shuvon Rankin, NP      . traZODone (DESYREL) tablet 100 mg  100 mg Oral QHS Amaia Lavallie   100 mg at 04/26/14 2126    Lab Results: No results found for this or any previous visit (from the past 48 hour(s)).  Physical Findings: AIMS: Facial and Oral Movements Muscles of Facial Expression: None, normal Lips and Perioral Area: None, normal Jaw: None, normal Tongue: None, normal,Extremity Movements Upper (arms, wrists, hands, fingers): None, normal Lower (legs, knees, ankles, toes): None, normal, Trunk Movements Neck, shoulders, hips: None, normal, Overall Severity Severity of abnormal movements (highest score from questions above): None, normal Incapacitation due to abnormal movements: None, normal Patient's awareness of abnormal movements (rate only patient's report): No Awareness, Dental Status Current problems with teeth and/or dentures?: No Does patient usually wear dentures?: No  CIWA:    COWS:     Assessment: Some improvement, noticed insofar as decreasing irritability, improved eye contact, and better ability to discuss issues regarding medication and illness. Insight remains fair, but is agreeing to take a medication, although not Carbamazepine- see above- which she has been refusing   Treatment Plan Summary: Daily contact with patient to assess and evaluate symptoms and progress in treatment Medication management  Plan:1. Continue crisis management and stabilization. 2.  Medication management -  -Continue  Zyprexa to 15 mgs QHS- for psychosis/delusions/mood. -Continue Haldol to 10mg  po/IM tid PRN  For agitation/ psychosis/delusions. -Continue Cogentin 1mg  po/IM tid for EPS prevention. -Continue Depakote 500mg  po bid for mood stabilization -Continue Trazodone to 100mg  po qhs for insomnia.  Medical Decision Making Problem Points:  Established problem, worsening(2) and Review of last therapy session (1) Data Points:  Review of medication regiment & side effects (2) Review of new medications or change in dosage (2)  I certify that inpatient services furnished can reasonably be expected to improve the patient's condition.   Thedore Mins, MD 04/27/2014, 10:32 AM

## 2014-04-27 NOTE — [facility] Group Notes (Signed)
[facility] Group Notes:  (Nursing/MHT/Case Management/Adjunct)  Date:  04/27/2014  Time: 0900 am Type of Therapy:  Psychoeducational Skills  Participation Level:  Did Not Attend   [name] 04/27/2014, 1:48 PM 

## 2014-04-27 NOTE — [facility] Group Notes (Signed)
[facility] LCSW Group Therapy  04/27/2014 , 3:17 PM   Type of Therapy:  Group Therapy  Participation Level: Did not attend  Summary of Progress/Problems: Today's group focused on the term Diagnosis.  Participants were asked to define the term, and then pronounce whether it is a negative, positive or neutral term.  [name], [name] 04/27/2014 , 3:17 PM

## 2014-04-28 MED ORDER — LORAZEPAM 2 MG/ML IJ SOLN: 1.0000 mg | Freq: Four times a day (QID) | INTRAMUSCULAR | Status: DC | PRN

## 2014-04-28 MED ORDER — BENZTROPINE MESYLATE 1 MG PO TABS
1.0000 mg | ORAL_TABLET | Freq: Every day | ORAL | Status: DC
  Administered 2014-04-28 – 2014-05-01 (×4): 1 mg via ORAL
  Filled 2014-04-28: qty 14
  Filled 2014-04-28 (×6): qty 1

## 2014-04-28 MED ORDER — LORAZEPAM 1 MG PO TABS
1.0000 mg | ORAL_TABLET | Freq: Four times a day (QID) | ORAL | Status: DC | PRN
  Administered 2014-04-28 – 2014-04-29 (×2): 1 mg via ORAL
  Filled 2014-04-28 (×2): qty 1

## 2014-04-28 NOTE — [facility] Group Notes (Signed)
[facility] LCSW Group Therapy  04/28/2014 1:50 PM   Type of Therapy:  Group Therapy  Participation Level:  [name] was invited to join us.  She chose to not come.  Summary of Progress/Problems: Today's group focused on relapse prevention.  We defined the term, and then brainstormed on ways to prevent relapse.  Daryel Geraldorth, Almalik Weissberg B 04/28/2014 , 1:50 PM

## 2014-04-28 NOTE — Progress Notes (Signed)
Patient ID: [name], female   DOB: Aug 16, 1986, 28 y.o.   MRN: [id number] [facility] MD Progress Note  04/28/2014 10:00 AM [name]  MRN:  [id number] Subjective/Objective: Patient seen and her chart reviewed. Patient has been refusing her routine medications, she continues to show poor insight into her mental illness as evidenced by her inability to recognize her need to take medications to address her mental illness. Her mood remains labile, irritable and she continues to be paranoid as evidenced by fixation on people poisoning her body with medications and waiting for Jesus to arrive. Patient is no longer interacting with her peers and staffs, she is self isolated, talking to herself as if responding to internal stimuli.   Diagnosis:   DSM5: Schizophrenia Disorders:  Delusional Disorder (297.1) and  Psychotic Disorder (298.8) Obsessive-Compulsive Disorders:   Trauma-Stressor Disorders:   Substance/Addictive Disorders:  Cannabis Use Disorder - Severe (304.30) Depressive Disorders:  Disruptive Mood Dysregulation Disorder (296.99) Total Time spent with patient: 25 minutes  Axis I: Bipolar I disorder, most recent episode (or current) manic           Delusional disorder, NOS           Cannabis use disorder Axis II: Cluster B Traits Axis III:  Past Medical History  Diagnosis Date  . S/P left arm fracture    Axis IV: other psychosocial or environmental problems and problems related to social environment  ADL's:  Fair   Sleep: improving   Appetite:  Fair  Suicidal Ideation:  denies Homicidal Ideation:  denies AEB (as evidenced by):  Psychiatric Specialty Exam: Physical Exam  Psychiatric: Her affect is angry and labile. Her speech is rapid and/or pressured and tangential. She is agitated, aggressive and actively hallucinating. Thought content is paranoid. Cognition and memory are normal. She expresses impulsivity.    Review of Systems  Constitutional: Negative.    HENT: Negative.   Eyes: Negative.   Respiratory: Negative.   Cardiovascular: Negative.   Gastrointestinal: Negative.   Genitourinary: Negative.   Musculoskeletal: Negative.   Skin: Negative.   Neurological: Negative.   Endo/Heme/Allergies: Negative.   Psychiatric/Behavioral: Positive for hallucinations and substance abuse. The patient is nervous/anxious and has insomnia.     Blood pressure 120/61, pulse 76, temperature 97.2 F (36.2 C), temperature source Oral, resp. rate 16, height 5\' 8"  (1.727 m), weight 83.462 kg (184 lb).Body mass index is 27.98 kg/(m^2).  General Appearance: Fairly groomed   Patent attorney::  Improved   Speech:  Hyper verbal  Volume:  increased  Mood:  irritable today  Affect:  Labile  Thought Process:  Circumstantial and Disorganized,   Orientation:  Full (Time, Place, and Person)  Thought Content: paranoid delusions  Suicidal Thoughts:  No-   Homicidal Thoughts:  No  Memory: NA   Judgement: poor  Insight:  poor  Psychomotor Activity:  Less pacing on unit   Concentration:  Fair  Recall:  Fair  Fund of Knowledge:Fair  Language: Good  Akathisia:  No  Handed:  Right  AIMS (if indicated):     Assets:  Communication Skills Physical Health  Sleep:  Number of Hours: 6.75   Musculoskeletal: Strength & Muscle Tone: within normal limits Gait & Station: normal Patient leans: N/A  Current Medications: Current Facility-Administered Medications  Medication Dose Route Frequency Provider Last Rate Last Dose  . acetaminophen (TYLENOL) tablet 650 mg  650 mg Oral Q4H PRN Shuvon Rankin, NP   650 mg at 04/24/14 1632  . alum &  mag hydroxide-simeth (MAALOX/MYLANTA) 200-200-20 MG/5ML suspension 30 mL  30 mL Oral Q4H PRN Shuvon Rankin, NP      . diphenhydrAMINE (BENADRYL) injection 50 mg  50 mg Intramuscular Q8H PRN Valentine Kuechle   50 mg at 04/26/14 1141  . divalproex (DEPAKOTE) DR tablet 500 mg  500 mg Oral BID PC Kaileigh Viswanathan   500 mg at 04/27/14 0808  .  feeding supplement (ENSURE COMPLETE) (ENSURE COMPLETE) liquid 237 mL  237 mL Oral BID BM Tenny CrawHeather S Winkler, RD      . haloperidol (HALDOL) tablet 10 mg  10 mg Oral Q8H PRN Aretha Levi       Or  . haloperidol lactate (HALDOL) injection 10 mg  10 mg Intramuscular Q8H PRN Naveya Ellerman   10 mg at 04/26/14 1142  . hydrOXYzine (ATARAX/VISTARIL) tablet 25 mg  25 mg Oral Q6H PRN Kerry HoughSpencer E Simon, PA-C   25 mg at 04/26/14 0937  . nicotine (NICODERM CQ - dosed in mg/24 hours) patch 21 mg  21 mg Transdermal Daily Jaslene Marsteller   21 mg at 04/25/14 0800  . OLANZapine (ZYPREXA) tablet 15 mg  15 mg Oral QHS Kenlynn Houde   15 mg at 04/26/14 2126  . ondansetron (ZOFRAN) tablet 4 mg  4 mg Oral Q8H PRN Shuvon Rankin, NP      . traZODone (DESYREL) tablet 100 mg  100 mg Oral QHS Garrette Caine   100 mg at 04/26/14 2126    Lab Results: No results found for this or any previous visit (from the past 48 hour(s)).  Physical Findings: AIMS: Facial and Oral Movements Muscles of Facial Expression: None, normal Lips and Perioral Area: None, normal Jaw: None, normal Tongue: None, normal,Extremity Movements Upper (arms, wrists, hands, fingers): None, normal Lower (legs, knees, ankles, toes): None, normal, Trunk Movements Neck, shoulders, hips: None, normal, Overall Severity Severity of abnormal movements (highest score from questions above): None, normal Incapacitation due to abnormal movements: None, normal Patient's awareness of abnormal movements (rate only patient's report): No Awareness, Dental Status Current problems with teeth and/or dentures?: No Does patient usually wear dentures?: No  CIWA:    COWS:     Assessment: Some improvement, noticed insofar as decreasing irritability, improved eye contact, and better ability to discuss issues regarding medication and illness. Insight remains fair, but is agreeing to take a medication, although not Carbamazepine- see above- which she has been  refusing   Treatment Plan Summary: Daily contact with patient to assess and evaluate symptoms and progress in treatment Medication management  Plan:1. Continue crisis management and stabilization. 2. Medication management -  -Continue  Zyprexa to 15 mgs QHS- for psychosis/delusions/mood. -Continue Haldol to 10mg  po/IM tid PRN  For agitation/ psychosis/delusions. -Continue Cogentin 1mg  po/IM tid for EPS prevention. -Continue Depakote 500mg  po bid for mood stabilization -Continue Trazodone to 100mg  po qhs for insomnia.  Medical Decision Making Problem Points:  Established problem, worsening(2) and Review of last therapy session (1) Data Points:  Review of medication regiment & side effects (2) Review of new medications or change in dosage (2)  I certify that inpatient services furnished can reasonably be expected to improve the patient's condition.   Thedore MinsAkintayo, Saffron Busey, MD 04/28/2014, 10:00 AM

## 2014-04-28 NOTE — Progress Notes (Signed)
  D: Patient woken up in bed to give depakote medication. Patient woke up and started complaining about the medication making her feel bad and that she wasn't going to take. Continued to encourage her to take the medication but she adamantly refused and told me to leave her room. A: Staff will continue to encourage medications. R: Patient went back to sleep. Agitated about the medication but calmed when I left room.

## 2014-04-28 NOTE — Progress Notes (Signed)
Patient ID: [name], female   DOB: 10/05/1986, 28 y.o.   MRN: [id number] D. Patient presents with irritable mood, affect labile. [name] remains very paranoid and easily agitated. She remains isolative to her room and unable to tolerate any stimulation on the unit. She continues to refuse her medications. This morning when writer approached her knocking on door patient screamed '' No thank you . Go away! No thank you Don't come in my room. '' Pt later approached writer with paper stuffed in her ears appearing to be responding to internal stimuli. She states '' I need some meds'' She accepted earlier refused depakote, and prn haldol. Writer notified Dr. Jannifer [name] and treatment team of above information. Pt continued to be paranoid , stating to writer '' my window isn't locked like everyone else, I don't know what kind of tests are going on here but I don't like it. I want to go home! '' Patient clearly paranoid. Writer notified L. Davis and orders received. Pt was offered prn ativan for agitation and paranoia, and although hesitant, she did accept medications. A. Support and encouragement provided. Medications given as ordered. Discussed patients status with treatment team. Pt aware [facility] ACTT team to see patient. R. Patient is safe. will continue to monitor q 15 minutes for safety.

## 2014-04-28 NOTE — Progress Notes (Signed)
Adult Psychoeducational Group Note  Date:  04/28/2014 Time:  9:31 PM  Group Topic/Focus:  Wrap-Up Group:   The focus of this group is to help patients review their daily goal of treatment and discuss progress on daily workbooks.  Participation Level:  Did Not Attend  Participation Quality:  Drowsy  Affect:  Flat  Cognitive:  Disorganized and Lacking  Insight: None  Engagement in Group:  None and Resistant  Modes of Intervention:  None  Additional Comments:  Pt did not want to attend group Pt decide to stay in bed the whole time during group   Quadasia Newsham R 04/28/2014, 9:31 PM

## 2014-04-28 NOTE — Plan of Care (Signed)
Problem: Ineffective individual coping Goal: STG: Patient will remain free from self harm Outcome: Progressing No self harm behaviors this shift.    Problem: Alteration in mood & ability to function due to Goal: LTG-Pt reports reduction in suicidal thoughts (Patient reports reduction in suicidal thoughts and is able to verbalize a safety plan for whenever patient is feeling suicidal)  Outcome: Progressing Pt denies SI this shift.   Goal: LTG-Pt verbalizes understanding of importance of med regimen (Patient verbalizes understanding of importance of medication regimen and need to continue outpatient care and support groups)  Outcome: Not Progressing Pt refused medications this shift.  Refused teaching.   Goal: STG-Patient will attend groups Outcome: Not Progressing Pt stayed in bed this evening.  Refused to go to group.    Problem: Alteration in thought process Goal: LTG-Patient has not harmed self or others in at least 2 days Outcome: Progressing Pt did not harm self or others this shift.    Problem: Aggression Towards others,Towards Self, and or Destruction Goal: STG-Patient will comply with prescribed medication regimen (Patient will comply with prescribed medication regimen)  Outcome: Not Progressing Refused medications this shift.

## 2014-04-29 MED ORDER — MAGNESIUM HYDROXIDE 400 MG/5ML PO SUSP: 30.0000 mL | Freq: Every day | ORAL | Status: DC | PRN

## 2014-04-29 NOTE — Progress Notes (Signed)
Patient ID: [name], female   DOB: 1986-01-05, 28 y.o.   MRN: [id number] D: Patient mood and affect is irritable and easily agitated. Pt isolative in room all evening with no interaction with peers. Pt refused evening medication stating "i don't need them". " leave it at the table and i will take them later". Pt denies suicidal thoughts and A/VH. No acute distressed noted at this time.   A: Met with pt 1:1. Pt encourage to take medications. Writer encouraged pt to discuss feelings. Pt encouraged to come to staff with any questions or concerns.   R: Patient is safe on the unit.  Continue current POC.

## 2014-04-29 NOTE — Progress Notes (Signed)
Patient ID: [name], female   DOB: [birth year], 28 y.o.   MRN: [id number]  [facility] MD Progress Note  04/29/2014 10:43 AM [name]  MRN:  [id number] Subjective/Objective: Patient seen and her chart reviewed. Patient has slept better since she took her meds and PRn. Says she felt sedated, prior to today she had refused her meds and was non compliant.  Her mood remains labile but not irritable, she does appear  paranoid as evidenced by fixation on people poisoning her body with medications and waiting for Jesus to arrive.  Patient did interact with peers today and was less isolative.  Diagnosis:   DSM5: Schizophrenia Disorders:  Delusional Disorder (297.1) and  Psychotic Disorder (298.8) Obsessive-Compulsive Disorders:   Trauma-Stressor Disorders:   Substance/Addictive Disorders:  Cannabis Use Disorder - Severe (304.30) Depressive Disorders:  Disruptive Mood Dysregulation Disorder (296.99) Total Time spent with patient: 25 minutes  Axis I: Bipolar I disorder, most recent episode (or current) manic           Delusional disorder, NOS           Cannabis use disorder Axis II: Cluster B Traits Axis III:  Past Medical History  Diagnosis Date  . S/P left arm fracture    Axis IV: other psychosocial or environmental problems and problems related to social environment  ADL's:  Fair   Sleep: improving   Appetite:  Fair  Suicidal Ideation:  denies Homicidal Ideation:  denies AEB (as evidenced by):  Psychiatric Specialty Exam: Physical Exam  Psychiatric: Her mood appears anxious. Her affect is not angry and not labile. Her speech is tangential. Her speech is not rapid and/or pressured. She is slowed and actively hallucinating. She is not agitated and not aggressive. Thought content is paranoid. Cognition and memory are normal. She expresses impulsivity.    Review of Systems  Constitutional: Negative.   HENT: Negative.   Eyes: Negative.   Respiratory: Negative.    Cardiovascular: Negative.   Gastrointestinal: Negative.   Genitourinary: Negative.   Musculoskeletal: Negative.   Skin: Negative.   Neurological: Negative.   Endo/Heme/Allergies: Negative.   Psychiatric/Behavioral: Positive for hallucinations and substance abuse. The patient is nervous/anxious and has insomnia.     Blood pressure 112/75, pulse 102, temperature 97.8 F (36.6 C), temperature source Oral, resp. rate 16, height 5\' 8"  (1.727 m), weight 184 lb (83.462 kg).Body mass index is 27.98 kg/(m^2).  General Appearance: Fairly groomed   Patent attorney::  Improved   Speech:  Hyper verbal  Volume:  increased  Mood:  irritable today  Affect:  Labile  Thought Process:  Circumstantial and Disorganized,   Orientation:  Full (Time, Place, and Person)  Thought Content: paranoid delusions  Suicidal Thoughts:  No-   Homicidal Thoughts:  No  Memory: NA   Judgement: poor  Insight:  poor  Psychomotor Activity:  Less pacing on unit   Concentration:  Fair  Recall:  Fair  Fund of Knowledge:Fair  Language: Good  Akathisia:  No  Handed:  Right  AIMS (if indicated):     Assets:  Communication Skills Physical Health  Sleep:  Number of Hours: 6.75   Musculoskeletal: Strength & Muscle Tone: within normal limits Gait & Station: normal Patient leans: N/A  Current Medications: Current Facility-Administered Medications  Medication Dose Route Frequency Provider Last Rate Last Dose  . acetaminophen (TYLENOL) tablet 650 mg  650 mg Oral Q4H PRN Shuvon Rankin, NP   650 mg at 04/24/14 1632  . alum & mag hydroxide-simeth (  MAALOX/MYLANTA) 200-200-20 MG/5ML suspension 30 mL  30 mL Oral Q4H PRN Shuvon Rankin, NP      . benztropine (COGENTIN) tablet 1 mg  1 mg Oral QHS Mojeed Akintayo   1 mg at 04/28/14 1740  . diphenhydrAMINE (BENADRYL) injection 50 mg  50 mg Intramuscular Q8H PRN Mojeed Akintayo   50 mg at 04/26/14 1141  . divalproex (DEPAKOTE) DR tablet 500 mg  500 mg Oral BID PC Mojeed Akintayo   500  mg at 04/29/14 0909  . feeding supplement (ENSURE COMPLETE) (ENSURE COMPLETE) liquid 237 mL  237 mL Oral BID BM Tenny CrawHeather S Winkler, RD      . haloperidol (HALDOL) tablet 10 mg  10 mg Oral Q8H PRN Mojeed Akintayo   10 mg at 04/29/14 [id number]   Or  . haloperidol lactate (HALDOL) injection 10 mg  10 mg Intramuscular Q8H PRN Mojeed Akintayo   10 mg at 04/26/14 1142  . hydrOXYzine (ATARAX/VISTARIL) tablet 25 mg  25 mg Oral Q6H PRN Kerry HoughSpencer E Simon, PA-C   25 mg at 04/26/14 [id number]  . LORazepam (ATIVAN) tablet 1 mg  1 mg Oral Q6H PRN Fransisca KaufmannLaura Davis, NP   1 mg at 04/29/14 [id number]   Or  . LORazepam (ATIVAN) injection 1 mg  1 mg Intramuscular Q6H PRN Fransisca KaufmannLaura Davis, NP      . nicotine (NICODERM CQ - dosed in mg/24 hours) patch 21 mg  21 mg Transdermal Daily Mojeed Akintayo   21 mg at 04/29/14 0910  . OLANZapine (ZYPREXA) tablet 15 mg  15 mg Oral QHS Mojeed Akintayo   15 mg at 04/26/14 2126  . ondansetron (ZOFRAN) tablet 4 mg  4 mg Oral Q8H PRN Shuvon Rankin, NP      . traZODone (DESYREL) tablet 100 mg  100 mg Oral QHS Mojeed Akintayo   100 mg at 04/26/14 2126    Lab Results: No results found for this or any previous visit (from the past 48 hour(s)).  Physical Findings: AIMS: Facial and Oral Movements Muscles of Facial Expression: None, normal Lips and Perioral Area: None, normal Jaw: None, normal Tongue: None, normal,Extremity Movements Upper (arms, wrists, hands, fingers): None, normal Lower (legs, knees, ankles, toes): None, normal, Trunk Movements Neck, shoulders, hips: None, normal, Overall Severity Severity of abnormal movements (highest score from questions above): None, normal Incapacitation due to abnormal movements: None, normal Patient's awareness of abnormal movements (rate only patient's report): No Awareness, Dental Status Current problems with teeth and/or dentures?: No Does patient usually wear dentures?: No  CIWA:    COWS:     Assessment: Some improvement, noticed insofar as decreasing  irritability, improved eye contact, and better ability to discuss issues regarding medication and illness. Insight remains fair, but is agreeing to take a medication, although not Carbamazepine- see above- which she has been refusing   Treatment Plan Summary: Daily contact with patient to assess and evaluate symptoms and progress in treatment Medication management  Plan:1. Continue crisis management and stabilization. 2. Medication management -  -Continue  Zyprexa to 15 mgs QHS- for psychosis/delusions/mood. -Continue Haldol to 10mg  po/IM tid PRN  For agitation/ psychosis/delusions. -Continue Cogentin 1mg  po/IM tid for EPS prevention. -Continue Depakote 500mg  po bid for mood stabilization -Continue Trazodone to 100mg  po qhs for insomnia.  Medical Decision Making Problem Points:  Established problem, worsening(2) and Review of last therapy session (1) Data Points:  Review of medication regiment & side effects (2) Review of new medications or change in dosage (2)  I certify that  inpatient services furnished can reasonably be expected to improve the patient's condition.   Thresa Ross, MD 04/29/2014, 10:43 AM

## 2014-04-29 NOTE — Progress Notes (Signed)
D: pt stated that she slept good last night. Pt wrote on self inventory that her appetite is poor and her energy level is somewhat low. Pt rates depression 0/10. hopelessness 5/10 and anxiety 7/10. Pt wrote that she is having problems breathing and having heart aches due to her medications. Pt rates pt 3/10 in left arm . Pt goal is to work on another day. Pt is wanting an albuterol inhaler, and wrote that she missed court yesterday. denies si/hi/avh. Pt is more complaint with taking her medications today. Pt asked for a print out of her medications, writer obliged.  A: scheduled medications given. q 15 min safety checks. Support and encouragement offered R: pt remains safe on unit. No signs of distress noted.

## 2014-04-29 NOTE — [facility] Group Notes (Signed)
[facility] Group Notes:  (Clinical Social Work)  04/29/2014   11:15am-12:00pm  Summary of Progress/Problems:  The main focus of today's process group was to listen to a variety of genres of music and to identify that different types of music provoke different responses.  The patient then was able to identify personally what was soothing for them, as well as energizing.    The patient expressed understanding of how music can be used at home as a wellness/recovery tool.  She chose Donn PieriniMichael Jackson music.  Type of Therapy:  Music Therapy   Participation Level:  Active  Participation Quality:  Attentive and Sharing  Affect:  Blunted  Cognitive:  Oriented  Insight:  Engaged  Engagement in Therapy:  Engaged  Modes of Intervention:   Activity, Exploration  Ambrose MantleMareida Grossman-Orr, LCSW 04/29/2014, 11:47 AM

## 2014-04-29 NOTE — Progress Notes (Signed)
The focus of this group is to help patients review their daily goal of treatment and discuss progress on daily workbooks. Pt attended the evening group, but left after the first few minutes saying that she did not feel well.

## 2014-04-30 MED ORDER — TRAZODONE HCL 50 MG PO TABS
50.0000 mg | ORAL_TABLET | Freq: Every day | ORAL | Status: DC
  Administered 2014-04-30: 50 mg via ORAL
  Filled 2014-04-30 (×2): qty 1

## 2014-04-30 NOTE — [facility] Group Notes (Signed)
[facility] Group Notes:  (Nursing/MHT/Case Management/Adjunct)  Date:  04/30/2014  Time:  9:10AM  Type of Therapy:  Psychoeducational Skills  Participation Level:  Did Not Attend   Summary of Progress/Problems:  [name], [name] 04/30/2014, 12:17 PM

## 2014-04-30 NOTE — Progress Notes (Signed)
Patient up briefly this evening. Resistant to interaction with this writer stating, "I just feel so drained. Not tired. I've been sleeping, but drained with no energy." Pt attended group but left early due to symptoms just described. During brief 1:1 with this Clinical research associatewriter, pt denied AVH/SI/HI. Denied pain or physical problems. She took her meds without difficulty however would not allow this writer to open and prepare her Ensure for her. Pt insisted she do it instead. Pt has been resting in bed since 2030 and remains safe. Lawrence MarseillesFriedman, Jaylanni Eltringham Eakes

## 2014-04-30 NOTE — [facility] Group Notes (Signed)
[facility] LCSW Group Therapy  04/30/2014 3:52 PM  Type of Therapy:  Group Therapy  Participation Level:  Did Not Attend  Participation Quality:  N/A  Affect:  N/A  Cognitive:  N/A  Insight:  N/A  Engagement in Therapy:  N/A  Modes of Intervention:  Discussion, Education, Exploration, Rapport Building, Socialization and Support  Summary of Progress/Problems: Pt did not attend  Seabron SpatesVaughn, Analya Louissaint Anne 04/30/2014, 3:52 PM

## 2014-04-30 NOTE — Progress Notes (Signed)
Patient ID: [name], female   DOB: [birth year], 28 y.o.   MRN: [id number]  [facility] MD Progress Note  04/30/2014 11:06 AM [name]  MRN:  [id number] Subjective/Objective:  Patient seen and her chart reviewed. Patient feels more sedated today with lack of energy. 2 nights ago she was having sleep concerns now somewhat oversedated. Otherwise taking her meds and compliant.   Her mood less labile  she does appear  Less paranoid. Patient is not otherwise deliberatily isolating herself or agitated.   Diagnosis:   DSM5: Schizophrenia Disorders:  Delusional Disorder (297.1) and  Psychotic Disorder (298.8) Obsessive-Compulsive Disorders:   Trauma-Stressor Disorders:   Substance/Addictive Disorders:  Cannabis Use Disorder - Severe (304.30) Depressive Disorders:  Disruptive Mood Dysregulation Disorder (296.99) Total Time spent with patient: 25 minutes  Axis I: Bipolar I disorder, most recent episode (or current) manic           Delusional disorder, NOS           Cannabis use disorder Axis II: Cluster B Traits Axis III:  Past Medical History  Diagnosis Date  . S/P left arm fracture    Axis IV: other psychosocial or environmental problems and problems related to social environment  ADL's:  Fair   Sleep: improving   Appetite:  Fair  Suicidal Ideation:  denies Homicidal Ideation:  denies AEB (as evidenced by):  Psychiatric Specialty Exam: Physical Exam  Psychiatric: Her mood appears anxious. Her affect is not angry and not labile. Her speech is tangential. Her speech is not rapid and/or pressured. She is slowed and actively hallucinating. She is not agitated and not aggressive. Thought content is paranoid. Cognition and memory are normal. She does not express impulsivity.    Review of Systems  Constitutional: Negative.   HENT: Negative.   Eyes: Negative.   Respiratory: Negative.   Cardiovascular: Negative.   Gastrointestinal: Negative.   Genitourinary: Negative.    Musculoskeletal: Negative.   Skin: Negative.   Neurological: Negative.   Endo/Heme/Allergies: Negative.   Psychiatric/Behavioral: Positive for hallucinations and substance abuse. The patient is nervous/anxious. The patient does not have insomnia.     Blood pressure 123/68, pulse 80, temperature 98 F (36.7 C), temperature source Oral, resp. rate 16, height 5\' 8"  (1.727 m), weight 184 lb (83.462 kg).Body mass index is 27.98 kg/(m^2).  General Appearance: Fairly groomed   Patent attorney::  Improved   Speech:  Hyper verbal  Volume:  increased  Mood:  irritable today  Affect:  Labile  Thought Process:  Circumstantial and Disorganized,   Orientation:  Full (Time, Place, and Person)  Thought Content: paranoid delusions  Suicidal Thoughts:  No-   Homicidal Thoughts:  No  Memory: NA   Judgement: poor  Insight:  poor  Psychomotor Activity:  Less pacing on unit   Concentration:  Fair  Recall:  Fair  Fund of Knowledge:Fair  Language: Good  Akathisia:  No  Handed:  Right  AIMS (if indicated):     Assets:  Communication Skills Physical Health  Sleep:  Number of Hours: 6.75   Musculoskeletal: Strength & Muscle Tone: within normal limits Gait & Station: normal Patient leans: N/A  Current Medications: Current Facility-Administered Medications  Medication Dose Route Frequency Provider Last Rate Last Dose  . acetaminophen (TYLENOL) tablet 650 mg  650 mg Oral Q4H PRN Shuvon Rankin, NP   650 mg at 04/24/14 1632  . alum & mag hydroxide-simeth (MAALOX/MYLANTA) 200-200-20 MG/5ML suspension 30 mL  30 mL Oral Q4H PRN Shuvon  Rankin, NP      . benztropine (COGENTIN) tablet 1 mg  1 mg Oral QHS Mojeed Akintayo   1 mg at 04/29/14 2110  . diphenhydrAMINE (BENADRYL) injection 50 mg  50 mg Intramuscular Q8H PRN Mojeed Akintayo   50 mg at 04/26/14 1141  . divalproex (DEPAKOTE) DR tablet 500 mg  500 mg Oral BID PC Mojeed Akintayo   500 mg at 04/30/14 0938  . feeding supplement (ENSURE COMPLETE) (ENSURE  COMPLETE) liquid 237 mL  237 mL Oral BID BM Tenny CrawHeather S Winkler, RD   237 mL at 04/29/14 1959  . haloperidol (HALDOL) tablet 10 mg  10 mg Oral Q8H PRN Mojeed Akintayo   10 mg at 04/29/14 [id number]   Or  . haloperidol lactate (HALDOL) injection 10 mg  10 mg Intramuscular Q8H PRN Mojeed Akintayo   10 mg at 04/26/14 1142  . hydrOXYzine (ATARAX/VISTARIL) tablet 25 mg  25 mg Oral Q6H PRN Kerry HoughSpencer E Simon, PA-C   25 mg at 04/26/14 [id number]  . LORazepam (ATIVAN) tablet 1 mg  1 mg Oral Q6H PRN Fransisca KaufmannLaura Davis, NP   1 mg at 04/29/14 [id number]   Or  . LORazepam (ATIVAN) injection 1 mg  1 mg Intramuscular Q6H PRN Fransisca KaufmannLaura Davis, NP      . magnesium hydroxide (MILK OF MAGNESIA) suspension 30 mL  30 mL Oral Daily PRN Thresa RossNadeem Yochanan Eddleman, MD      . nicotine (NICODERM CQ - dosed in mg/24 hours) patch 21 mg  21 mg Transdermal Daily Mojeed Akintayo   21 mg at 04/30/14 0800  . OLANZapine (ZYPREXA) tablet 15 mg  15 mg Oral QHS Mojeed Akintayo   15 mg at 04/29/14 2106  . ondansetron (ZOFRAN) tablet 4 mg  4 mg Oral Q8H PRN Shuvon Rankin, NP      . traZODone (DESYREL) tablet 50 mg  50 mg Oral QHS Thresa RossNadeem Laureen Frederic, MD        Lab Results: No results found for this or any previous visit (from the past 48 hour(s)).  Physical Findings: AIMS: Facial and Oral Movements Muscles of Facial Expression: None, normal Lips and Perioral Area: None, normal Jaw: None, normal Tongue: None, normal,Extremity Movements Upper (arms, wrists, hands, fingers): None, normal Lower (legs, knees, ankles, toes): None, normal, Trunk Movements Neck, shoulders, hips: None, normal, Overall Severity Severity of abnormal movements (highest score from questions above): None, normal Incapacitation due to abnormal movements: None, normal Patient's awareness of abnormal movements (rate only patient's report): No Awareness, Dental Status Current problems with teeth and/or dentures?: No Does patient usually wear dentures?: No  CIWA:    COWS:     Assessment: Some improvement  but now feels sleepy. Will consider lowering dose of trazadone. Continue other meds if needed saphris dose can be cut down.   Treatment Plan Summary: Daily contact with patient to assess and evaluate symptoms and progress in treatment Medication management  Plan:1. Continue crisis management and stabilization. 2. Medication management -  -Continue  Zyprexa to 15 mgs QHS- for psychosis/delusions/mood. -Continue Haldol to 10mg  po/IM tid PRN  For agitation/ psychosis/delusions. -Continue Cogentin 1mg  po/IM tid for EPS prevention. -Continue Depakote 500mg  po bid for mood stabilization -Continue Trazodone to 100mg  po qhs for insomnia.  Medical Decision Making Problem Points:  Established problem, worsening(2) and Review of last therapy session (1) Data Points:  Review of medication regiment & side effects (2) Review of new medications or change in dosage (2)  I certify that inpatient services furnished can reasonably be  expected to improve the patient's condition.   Thresa Ross, MD 04/30/2014, 11:06 AM

## 2014-04-30 NOTE — [facility] Group Notes (Signed)
[facility] Group Notes:  (Nursing/MHT/Case Management/Adjunct)  Date:  04/29/2014  Time:  9:00AM  Type of Therapy:  Self Inventory  Participation Level:  Active  Participation Quality:  Appropriate  Affect:  Appropriate  Cognitive:  Appropriate  Insight:  Good  Engagement in Group:  Engaged  Modes of Intervention:  Discussion  Summary of Progress/Problems:  Kellie Moorerry, Canda Podgorski M 04/30/2014, 8:43 AM

## 2014-04-30 NOTE — Progress Notes (Signed)
Adult Psychoeducational Group Note  Date:  04/30/2014 Time:  8:41 PM  Group Topic/Focus:  Wrap-Up Group:   The focus of this group is to help patients review their daily goal of treatment and discuss progress on daily workbooks.  Participation Level:  Active  Participation Quality:  Appropriate  Affect:  Appropriate  Cognitive:  Appropriate  Insight: Appropriate  Engagement in Group:  Engaged  Modes of Intervention:  Discussion  Additional Comments: The patient expressed that her day was ok.The patient said that her medications seem to work better.  Octavio Mannshigpen, Cally Nygard Lee 04/30/2014, 8:41 PM

## 2014-04-30 NOTE — [facility] Group Notes (Signed)
[facility] Group Notes:  (Nursing/MHT/Case Management/Adjunct)  Date:  04/29/2014 Time:  9:10AM Type of Therapy:  Music Therapy  Participation Level:  Active  Participation Quality:  Appropriate  Affect:  Appropriate  Cognitive:  Appropriate  Insight:  Good  Engagement in Group:  Engaged  Modes of Intervention:  Discussion  Summary of Progress/Problems:  Kellie Moorerry, Izack Hoogland M 04/30/2014, 8:45 AM

## 2014-04-30 NOTE — [facility] Group Notes (Signed)
[facility] Group Notes:  (Nursing/MHT/Case Management/Adjunct)  Date:  04/30/2014  Time:  9:00AM Type of Therapy:  Self Inventory  Participation Level:  Did Not Attend   Summary of Progress/Problems:  Kellie Moorerry, Burnetta Kohls M 04/30/2014, 12:16 PM

## 2014-04-30 NOTE — Progress Notes (Signed)
D:pt stated that she slept okay last night. Pt stated she feels like she has really low energy and still stating that she is having trouble breathing. Pt believes that it is the medications she is on that is causing her to feel this way. Pt wrote on self inventory that her appetite is poor. Rates depression 0/10, hopelessness 5/10, and anxiety 3/10. Pt wrote that her left arm is bothering her but taking pain medications, " makes me feel bad." pts goal is to work on today being her last day here and will attempt to meet that goal by following the rules. Denies si/hi/avh A: scheduled medications given. q 15 min safety checks. Support and encouragement offered. R: pt remains safe on unit. No further signs of distress noted.

## 2014-04-30 NOTE — Plan of Care (Signed)
Problem: Alteration in thought process Goal: STG-Patient does not respond to command hallucinations Outcome: Progressing Patient is denying AH, command or otherwise.  Problem: Aggression Towards others,Towards Self, and or Destruction Goal: STG- Patient will take staff initiated or self initiated (Patient will take staff initiated or self initiated time out)  Outcome: Progressing Pt has not been combative on the unit and her behavior has not warranted a time out.

## 2014-05-01 MED ORDER — HYDROMORPHONE HCL PF 2 MG/ML IJ SOLN: 0.2500 mg | INTRAMUSCULAR | Status: DC | PRN

## 2014-05-01 MED ORDER — METOCLOPRAMIDE HCL 5 MG/ML IJ SOLN
10.0000 mg | Freq: Once | INTRAMUSCULAR | Status: AC | PRN
  Filled 2014-05-01: qty 2

## 2014-05-01 MED ORDER — OXYCODONE HCL 5 MG/5ML PO SOLN: 5.0000 mg | Freq: Once | ORAL | Status: AC | PRN

## 2014-05-01 MED ORDER — OXYCODONE HCL 5 MG PO TABS: 5.0000 mg | ORAL_TABLET | Freq: Once | ORAL | Status: AC | PRN

## 2014-05-01 NOTE — [facility] Group Notes (Signed)
[facility]   05/01/2014 10:17 AM  Participation Quality:  Engaged  Mood/Affect:  Flat  Depression Rating:  denies  Anxiety Rating:  denies  Thoughts of Suicide:  No Will you contract for safety?   NA  Current AVH:  Denies  Plan for Discharge/Comments:  [name] is talking in complete sentences.  She tells me that [facility] who assured her that they will be able to help her.  [facility]  "[name] usually lets me stay there.  It will be OK.  The ACT team will pick me up at d/c." Transportation Means: ACT team  Supports: ACT team  Ida RogueNorth, [name]

## 2014-05-01 NOTE — [facility] Group Notes (Signed)
[facility] LCSW Group Therapy  05/01/2014 1:15 pm  Type of Therapy: Process Group Therapy  Participation Level:  Did not attend.  Was in an interview with [facility] team.  Summary of Progress/Problems: Today's group addressed the issue of overcoming obstacles.  Patients were asked to identify their biggest obstacle post d/c that stands in the way of their on-going success, and then problem solve as to how to manage this.    Daryel Geraldorth, Druanne Bosques B 05/01/2014   2:50 PM

## 2014-05-01 NOTE — Progress Notes (Signed)
Pt presents less anxious and irritable this morning. Pt compliant with taking her meds and appears more insightful in regards to her treatment plan. Pt also attending groups today. Pt has improved hygiene. Pt denies SI/HI/AVH. Pt remains paranoid about her medications although she is compliant with taking them. Pt believes that the meds are causing her to breathe abnormally. No distress noted. Previous shift reported off that pt have been reassured that her breathing pattern is wnl and o2 99%.  Medications administered as ordered per MD. Verbal support given. Pt encouraged to attend groups. 15 minute checks performed for safety.  Pt safety maintained at this time. Pt speech logical/coherent.  Pt forwarded little information during shift assessment.

## 2014-05-01 NOTE — Progress Notes (Signed)
Patient ID: [name], female   DOB: 07/24/1986, 28 y.o.   MRN: [id number]  [facility] MD Progress Note  05/01/2014 12:00 PM [name]  MRN:  [id number] Subjective:  Patient states "I feel really tired. I can't multi task like I used to. I don't think it's the Trazodone. I'm getting side effects from my medications. I'm just forcing myself to do things. I'm not really sure about all these medications."  Objective:  Patient is seen and chart is reviewed. Her thought processes appear much more organized today. Patient is very anxious about wanting to be discharged today. She has needed encouragement to take her medications. Thereasa believes her feelings of fatigue are related to her medications. Treatment team members have told patient that her progress is related to medication compliance. She has been encouraged to keep taking her medications after discharge.   Diagnosis:   DSM5: Schizophrenia Disorders:  Delusional Disorder (297.1) and  Psychotic Disorder (298.8) Obsessive-Compulsive Disorders:   Trauma-Stressor Disorders:   Substance/Addictive Disorders:  Cannabis Use Disorder - Severe (304.30) Depressive Disorders:  Disruptive Mood Dysregulation Disorder (296.99) Total Time spent with patient: 15 minutes  Axis I: Bipolar I disorder, most recent episode (or current) manic           Delusional disorder, NOS           Cannabis use disorder Axis II: Cluster B Traits Axis III:  Past Medical History  Diagnosis Date  . S/P left arm fracture    Axis IV: other psychosocial or environmental problems and problems related to social environment  ADL's:  Fair   Sleep: improving   Appetite:  Fair  Suicidal Ideation:  denies Homicidal Ideation:  denies AEB (as evidenced by):  Psychiatric Specialty Exam: Physical Exam  Psychiatric: Her speech is normal and behavior is normal. Thought content normal. Her mood appears anxious. Her affect is not angry and not labile. Cognition  and memory are normal. She does not express impulsivity.    Review of Systems  Constitutional: Negative.   HENT: Negative.   Eyes: Negative.   Respiratory: Negative.   Cardiovascular: Negative.   Gastrointestinal: Negative.   Genitourinary: Negative.   Musculoskeletal: Negative.   Skin: Negative.   Neurological: Negative.   Endo/Heme/Allergies: Negative.   Psychiatric/Behavioral: Positive for hallucinations and substance abuse. The patient is nervous/anxious. The patient does not have insomnia.     Blood pressure 124/73, pulse 84, temperature 98 F (36.7 C), temperature source Oral, resp. rate 16, height 5\' 8"  (1.727 m), weight 83.462 kg (184 lb).Body mass index is 27.98 kg/(m^2).  General Appearance: Fairly groomed   Patent attorney::  Improved   Speech:  Pressured  Volume:  Normal   Mood:  Anxious  Affect:  Labile  Thought Process:  Circumstantial and Disorganized,   Orientation:  Full (Time, Place, and Person)  Thought Content: Rumination   Suicidal Thoughts:  No-   Homicidal Thoughts:  No  Memory: NA   Judgement: poor  Insight:  Fair   Psychomotor Activity: Restless   Concentration:  Fair  Recall:  Fair  Fund of Knowledge:Fair  Language: Good  Akathisia:  No  Handed:  Right  AIMS (if indicated):     Assets:  Communication Skills Desire for Improvement Physical Health Resilience  Sleep:  Number of Hours: 6   Musculoskeletal: Strength & Muscle Tone: within normal limits Gait & Station: normal Patient leans: N/A  Current Medications: Current Facility-Administered Medications  Medication Dose Route Frequency Provider Last Rate Last  Dose  . acetaminophen (TYLENOL) tablet 650 mg  650 mg Oral Q4H PRN Shuvon Rankin, NP   650 mg at 05/01/14 0455  . alum & mag hydroxide-simeth (MAALOX/MYLANTA) 200-200-20 MG/5ML suspension 30 mL  30 mL Oral Q4H PRN Shuvon Rankin, NP   30 mL at 05/01/14 0451  . benztropine (COGENTIN) tablet 1 mg  1 mg Oral QHS Maxten Shuler   1 mg at  04/30/14 2144  . diphenhydrAMINE (BENADRYL) injection 50 mg  50 mg Intramuscular Q8H PRN Alora Gorey   50 mg at 04/26/14 1141  . divalproex (DEPAKOTE) DR tablet 500 mg  500 mg Oral BID PC Jenine Krisher   500 mg at 05/01/14 0823  . feeding supplement (ENSURE COMPLETE) (ENSURE COMPLETE) liquid 237 mL  237 mL Oral BID BM Tenny Craw, RD   237 mL at 04/30/14 2142  . haloperidol (HALDOL) tablet 10 mg  10 mg Oral Q8H PRN Kedarius Aloisi   10 mg at 04/29/14 1610   Or  . haloperidol lactate (HALDOL) injection 10 mg  10 mg Intramuscular Q8H PRN Jeret Goyer   10 mg at 04/26/14 1142  . hydrOXYzine (ATARAX/VISTARIL) tablet 25 mg  25 mg Oral Q6H PRN Kerry Hough, PA-C   25 mg at 04/26/14 9604  . LORazepam (ATIVAN) tablet 1 mg  1 mg Oral Q6H PRN Fransisca Kaufmann, NP   1 mg at 04/29/14 5409   Or  . LORazepam (ATIVAN) injection 1 mg  1 mg Intramuscular Q6H PRN Fransisca Kaufmann, NP      . magnesium hydroxide (MILK OF MAGNESIA) suspension 30 mL  30 mL Oral Daily PRN Thresa Ross, MD      . nicotine (NICODERM CQ - dosed in mg/24 hours) patch 21 mg  21 mg Transdermal Daily Deshon Hsiao   21 mg at 05/01/14 0800  . OLANZapine (ZYPREXA) tablet 15 mg  15 mg Oral QHS Tyyne Cliett   15 mg at 04/30/14 2144  . ondansetron (ZOFRAN) tablet 4 mg  4 mg Oral Q8H PRN Shuvon Rankin, NP        Lab Results: No results found for this or any previous visit (from the past 48 hour(s)).  Physical Findings: AIMS: Facial and Oral Movements Muscles of Facial Expression: None, normal Lips and Perioral Area: None, normal Jaw: None, normal Tongue: None, normal,Extremity Movements Upper (arms, wrists, hands, fingers): None, normal Lower (legs, knees, ankles, toes): None, normal, Trunk Movements Neck, shoulders, hips: None, normal, Overall Severity Severity of abnormal movements (highest score from questions above): None, normal Incapacitation due to abnormal movements: None, normal Patient's awareness of abnormal  movements (rate only patient's report): No Awareness, Dental Status Current problems with teeth and/or dentures?: No Does patient usually wear dentures?: No  CIWA:    COWS:     Assessment:  Treatment Plan Summary: Daily contact with patient to assess and evaluate symptoms and progress in treatment Medication management  Plan:1. Continue crisis management and stabilization. 2. Medication management -  -Continue  Zyprexa to 15 mgs QHS- for psychosis/delusions/mood. -Continue Haldol to 10mg  po/IM tid PRN  For agitation/ psychosis/delusions. -Continue Cogentin 1mg  po/IM hs for EPS prevention. -Continue Depakote 500mg  po bid for mood stabilization -D/C Trazodone due to complaints of fatigue.  3. Address health issues: Vitals reviewed and stable. 4. Anticipate discharge tomorrow. 5. Depakote level in am.   Medical Decision Making Problem Points:  Established problem, improving (1) and Review of last therapy session (1) Data Points:  Review of medication regiment &  side effects (2)  I certify that inpatient services furnished can reasonably be expected to improve the patient's condition.   Fransisca KaufmannDAVIS, LAURA, NP-C 05/01/2014, 12:00 PM  Patient seen, evaluated and I agree with notes by Nurse Practitioner. Thedore MinsMojeed Hadleigh Felber, MD

## 2014-05-01 NOTE — Progress Notes (Signed)
Patient ID: Jeanne FewJennifer D [name], female   DOB: 02/14/1986, 28 y.o.   MRN: [id number] D)  Has been pleasant and cooperative this evening, out and about on the hall.  She wanted to talk about her meds and asked me to her room where she had her med list and also sheets on several of her meds.  Had been reading about the side effects and felt pretty sure she had most of the side effects listed and was having thoughts about not taking her meds.  Checked pulse ox for her, was 99% on room air, as she was having SOB.  Also was asking about her cast and had fears that she would not be able to use her arm after the cast was taken off.   A) Explained that the cast was keeping her fracture immobilized so that it could heal properly.  Encouraged her to take her meds and speak to her MD tomorrow about her concerns. Much reassurance given, support and encouragement, and she felt better and agreed to take her meds.R)  Remains safe on unit.

## 2014-05-01 NOTE — Progress Notes (Signed)
Patient ID: [name], female   DOB: 03/12/1986, 28 y.o.   MRN: [id number] D   --  Pt. Denies pain  Of any kind at this time.  She is calm, and quiet and maintains a pleasant, friendly affect.  She spends time in dayroom with peers watching TV.  Pt. Has removed part of her cast to "make it easier to use both my hands".  Pt. Removed the part that went between herr thumb and fore finger.   Pt. Was educated on keeping the cast elevated and to report and pain of throbbing.   Pt. Has shown no negative behaviors this evening.  A  ---  Support and meds as ordered and safety cks.   R  --- pt. Remains calm and safe on unit

## 2014-05-01 NOTE — Progress Notes (Signed)
Adult Psychoeducational Group Note  Date:  05/01/2014 Time:  9:40 PM  Group Topic/Focus:  Wrap-Up Group:   The focus of this group is to help patients review their daily goal of treatment and discuss progress on daily workbooks.  Participation Level:  Minimal  Participation Quality:  Inattentive  Affect:  Irritable  Cognitive:  Lacking  Insight: Limited  Engagement in Group:  Limited  Modes of Intervention:  Socialization and Support  Additional Comments:  Patient attended and participated in group tonight. She reports that she took her medication today, she saw her doctor, went for her meals and attended her groups. She advised that today was a better day for her.  Lita MainsFrancis, Alcus Bradly [facility] 05/01/2014, 9:40 PM

## 2014-05-02 LAB — VALPROIC ACID LEVEL: Valproic Acid Lvl: 60 ug/mL (ref 50.0–100.0)

## 2014-05-02 MED ORDER — BENZTROPINE MESYLATE 1 MG PO TABS: 1.0000 mg | ORAL_TABLET | Freq: Every day | ORAL | Status: DC

## 2014-05-02 MED ORDER — DIVALPROEX SODIUM 500 MG PO DR TAB: 500.0000 mg | DELAYED_RELEASE_TABLET | Freq: Two times a day (BID) | ORAL | Status: DC

## 2014-05-02 MED ORDER — OLANZAPINE 15 MG PO TABS: 15.0000 mg | ORAL_TABLET | Freq: Every day | ORAL | Status: DC

## 2014-05-02 NOTE — [facility] Suicide Risk Assessment (Signed)
   Demographic Factors:  Caucasian, Low socioeconomic status, Unemployed and homeless  Total Time spent with patient: 20 minutes  Psychiatric Specialty Exam: Physical Exam  Psychiatric: She has a normal mood and affect. Her speech is normal and behavior is normal. Judgment and thought content normal. Cognition and memory are normal.    Review of Systems  Constitutional: Negative.   HENT: Negative.   Eyes: Negative.   Respiratory: Negative.   Cardiovascular: Negative.   Gastrointestinal: Negative.   Genitourinary: Negative.   Musculoskeletal: Negative.   Skin: Negative.   Neurological: Negative.   Endo/Heme/Allergies: Negative.   Psychiatric/Behavioral: Negative.     Blood pressure 125/73, pulse 107, temperature 97.5 F (36.4 C), temperature source Oral, resp. rate 18, height 5\' 8"  (1.727 m), weight 83.462 kg (184 lb).Body mass index is 27.98 kg/(m^2).  General Appearance: Fairly Groomed  Patent attorneyye Contact::  Good  Speech:  Clear and Coherent  Volume:  Normal  Mood:  Euthymic  Affect:  Appropriate  Thought Process:  Goal Directed  Orientation:  Full (Time, Place, and Person)  Thought Content:  Negative  Suicidal Thoughts:  No  Homicidal Thoughts:  No  Memory:  Immediate;   Fair Recent;   Fair Remote;   Fair  Judgement:  Fair  Insight:  Fair  Psychomotor Activity:  Normal  Concentration:  Fair  Recall:  FiservFair  Fund of Knowledge:Fair  Language: Fair  Akathisia:  No  Handed:  Right  AIMS (if indicated):     Assets:  Communication Skills  Sleep:  Number of Hours: 6    Musculoskeletal: Strength & Muscle Tone: within normal limits Gait & Station: normal Patient leans: N/A   Mental Status Per Nursing Assessment::   On Admission:     Current Mental Status by Physician: patient to keep her after care appointment  Loss Factors: Financial problems/change in socioeconomic status  Historical Factors: NA  Risk Reduction Factors:   Positive social  support  Continued Clinical Symptoms:  Resolving mood and delusional symptoms  Cognitive Features That Contribute To Risk:  Closed-mindedness Polarized thinking    Suicide Risk:  Minimal: No identifiable suicidal ideation.  Patients presenting with no risk factors but with morbid ruminations; may be classified as minimal risk based on the severity of the depressive symptoms  Discharge Diagnoses:   AXIS I:  Bipolar I disorder, most recent episode (or current) manic               Cannabis use disorder  AXIS II:  Cluster B Traits AXIS III:   Past Medical History  Diagnosis Date  . Asthma    AXIS IV:  housing problems, other psychosocial or environmental problems and problems related to social environment AXIS V:  61-70 mild symptoms  Plan Of Care/Follow-up recommendations:  Activity:  as tolerated Diet:  healthy Tests:  Valproic acid level Other:  patient to keep her after care appointment  Is patient on multiple antipsychotic therapies at discharge:  No   Has Patient had three or more failed trials of antipsychotic monotherapy by history:  No  Recommended Plan for Multiple Antipsychotic Therapies: NA    Thedore MinsAkintayo, Shawntee Mainwaring, MD 05/02/2014, 9:26 AM

## 2014-05-02 NOTE — Discharge Summary (Signed)
Physician Discharge Summary Note  Patient:  [name] [name] is an 28 y.o., female MRN:  [id number] DOB:  08/25/1986 Patient phone:  [phone] (home)  Patient address:   [address] [place] [id number],  Total Time spent with patient: 20 minutes  Date of Admission:  04/18/2014 Date of Discharge: 05/02/14  Reason for Admission:  Agitation, Psychosis   Discharge Diagnoses: Principal Problem:   Bipolar I disorder, most recent episode (or current) manic Active Problems:   Psychotic disorder   Delusional disorder   Unspecified episodic mood disorder   Cannabis abuse   Psychiatric Specialty Exam: Physical Exam  Review of Systems  Constitutional: Negative.   HENT: Negative.   Eyes: Negative.   Respiratory: Negative.   Cardiovascular: Negative.   Gastrointestinal: Negative.   Genitourinary: Negative.   Musculoskeletal: Negative.   Skin: Negative.   Neurological: Negative.   Endo/Heme/Allergies: Negative.   Psychiatric/Behavioral: Negative.     Blood pressure 125/73, pulse 107, temperature 97.5 F (36.4 C), temperature source Oral, resp. rate 18, height 5\' 8"  (1.727 m), weight 83.462 kg (184 lb).Body mass index is 27.98 kg/(m^2).  See Physician SRA                                                  Past Psychiatric History: See H&P Diagnosis:  Hospitalizations:  Outpatient Care:  Substance Abuse Care:  Self-Mutilation:  Suicidal Attempts:  Violent Behaviors:   Musculoskeletal: Strength & Muscle Tone: within normal limits Gait & Station: normal Patient leans: N/A  DSM5:  AXIS I: Bipolar I disorder, most recent episode (or current) manic  Cannabis use disorder  AXIS II: Cluster B Traits  AXIS III:  Past Medical History   Diagnosis  Date   .  Asthma    AXIS IV: housing problems, other psychosocial or environmental problems and problems related to social environment  AXIS V: 61-70 mild symptoms  Level of Care:  OP  Hospital  Course:  [name] is a 28 year old female [facility] twenty four hour period of time. Patient had been discharged on Wednesday but returned acting belligerent and demanding. The patient was escorted out of the ED by [employer] for trespassing. She ended up in jail and somehow sustained a fracture to her left humerus. The Sheriff did not feel comfortable releasing her to the community in a psychotic state so IVC papers were taken out on her. The [employer] Deputy was familiar with the patient over the last several years due to multiple encounters with the law. He reported that he had never seen her in such an altered state. Her UDS does show marijuana use, but no other substances and her medical evaluation does not indicate any reason for her persistant confused state. Her arm was not operated on due to her acute mental instability. Since arriving to the unit [name] has been agitated, labile, disorganized, paranoid and irritable. She was attempting to remove her cast last night but was stopped by nursing staff. Patient was not cooperative when speaking to the treatment team this morning. She was observed pacing in circles at the end of the hall talking to herself. Patient would not answer questions and denied that [name] was her name stating "That's not my name. I don't know what you want to talk about. I have no idea  who Ardena is. No further questions. Please leave me alone." The patient was noted to keep repeating "In the name of Jesus." She has needed redirection for wandering in other patient's rooms, wiping information off the white board, and taking door signs out of their holder. Dr. Dub Mikes has been consulted toobtain a second opinion for forced medications. Earlier patient refused IM injections of Ativan, Benadryl, and Geodon. She has also been reported to have made other religiously focused statements such as "I am here in the name of Jesus Christ."          [name]  was admitted to the adult 400 unit where she was evaluated and her symptoms were identified. Medication management was discussed and implemented. The patient was not taking any psychiatric medications prior to admission. Patient was started on Depakote 500 mg BID for mood lability and Zyprexa 15 mg hs for psychosis. These medication dosages were adjusted to address her manic symptoms. She was encouraged to participate in unit programming. Medical problems were identified and treated appropriately. Patient had follow up with orthopedics due to her left humerus fracture after discharge.  Home medication was restarted as needed.  She was evaluated each day by a clinical provider to ascertain the patient's response to treatment.  Improvement was noted by the patient's report of decreasing symptoms, improved sleep and appetite, affect, medication tolerance, behavior, and participation in unit programming.  The patient was asked each day to complete a self inventory noting mood, mental status, pain, new symptoms, anxiety and concerns.         She responded well to medication and being in a therapeutic and supportive environment. Positive and appropriate behavior was noted and the patient was motivated for recovery.  She worked closely with the treatment team and case manager to develop a discharge plan with appropriate goals. Coping skills, problem solving as well as relaxation therapies were also part of the unit programming.         By the day of discharge she was in much improved condition than upon admission.  Symptoms were reported as significantly decreased or resolved completely.  The patient denied SI/HI and voiced no AVH. She was motivated to continue taking medication with a goal of continued improvement in mental health.  [name] was discharged home with a plan to follow up as noted below.  Consults:  None  Significant Diagnostic Studies:  Chemistry, CBC, UDS positive for marijuana   Discharge  Vitals:   Blood pressure 125/73, pulse 107, temperature 97.5 F (36.4 C), temperature source Oral, resp. rate 18, height 5\' 8"  (1.727 m), weight 83.462 kg (184 lb). Body mass index is 27.98 kg/(m^2). Lab Results:   No results found for this or any previous visit (from the past 72 hour(s)).  Physical Findings: AIMS: Facial and Oral Movements Muscles of Facial Expression: None, normal Lips and Perioral Area: None, normal Jaw: None, normal Tongue: None, normal,Extremity Movements Upper (arms, wrists, hands, fingers): None, normal Lower (legs, knees, ankles, toes): None, normal, Trunk Movements Neck, shoulders, hips: None, normal, Overall Severity Severity of abnormal movements (highest score from questions above): None, normal Incapacitation due to abnormal movements: None, normal Patient's awareness of abnormal movements (rate only patient's report): No Awareness, Dental Status Current problems with teeth and/or dentures?: No Does patient usually wear dentures?: No  CIWA:    COWS:     Psychiatric Specialty Exam: See Psychiatric Specialty Exam and Suicide Risk Assessment completed by Attending Physician prior to discharge.  Discharge  destination:  Home  Is patient on multiple antipsychotic therapies at discharge:  No   Has Patient had three or more failed trials of antipsychotic monotherapy by history:  No  Recommended Plan for Multiple Antipsychotic Therapies: NA  Discharge Instructions   Discharge instructions    Complete by:  As directed   Please make appointment with Orthopedic to follow up for left humerus fracture.            Medication List    STOP taking these medications       traMADol 50 MG tablet  Commonly known as:  ULTRAM      TAKE these medications     Indication   benztropine 1 MG tablet  Commonly known as:  COGENTIN  Take 1 tablet (1 mg total) by mouth at bedtime.   Indication:  Extrapyramidal Reaction caused by Medications     divalproex 500 MG  DR tablet  Commonly known as:  DEPAKOTE  Take 1 tablet (500 mg total) by mouth 2 (two) times daily after a meal.   Indication:  Rapidly Alternating Manic-Depressive Psychosis     ibuprofen 200 MG tablet  Commonly known as:  ADVIL,MOTRIN  Take 400-600 mg by mouth every 6 (six) hours as needed for mild pain or moderate pain.      OLANZapine 15 MG tablet  Commonly known as:  ZYPREXA  Take 1 tablet (15 mg total) by mouth at bedtime.   Indication:  Depressive Phase of Manic-Depression           Follow-up Information   Follow up with [facility] (Call this number to make a follow up appointment with Dr Amanda Pea for your cast)    Contact information:   [address]  [place] [phone]      Follow up with [facility] team. (They will pick you up at 1PM the day of your discharge.)    Contact information:   [address]  [place] [phone]      Follow-up recommendations:   Activity: as tolerated  Diet: healthy  Tests: Valproic acid level  Other: patient to keep her after care appointment   Comments:   Take all your medications as prescribed by your mental healthcare provider.  Report any adverse effects and or reactions from your medicines to your outpatient provider promptly.  Patient is instructed and cautioned to not engage in alcohol and or illegal drug use while on prescription medicines.  In the event of worsening symptoms, patient is instructed to call the crisis hotline, 911 and or go to the nearest ED for appropriate evaluation and treatment of symptoms.  Follow-up with your primary care provider for your other medical issues, concerns and or health care needs.   Total Discharge Time:  Greater than 30 minutes.  SignedFransisca Kaufmann NP-C 05/02/2014, 9:35 AM  Patient seen, evaluated and I agree with notes by Nurse Practitioner. Thedore Mins, MD

## 2014-05-02 NOTE — Progress Notes (Signed)
Adult Psychoeducational Group Note  Date:  05/02/2014 Time:  11:15 AM  Group Topic/Focus:  Recovery Goals:   The focus of this group is to identify appropriate goals for recovery and establish a plan to achieve them.  Participation Level:  Minimal  Participation Quality:  Intrusive, Monopolizing and Redirectable  Affect:  Blunted and Defensive  Cognitive:  Disorganized and Lacking  Insight: Lacking  Engagement in Group:  Distracting, Monopolizing and Off Topic  Modes of Intervention:  Clarification, Discussion, Education and Limit-setting  Additional Comments:  Pt had to be redirected numerous times during group. She eventually walked out of the group half way through the group session.  Shyenne Maggard E 05/02/2014, 11:15 AM

## 2014-05-02 NOTE — Progress Notes (Signed)
Patient ID: [name], female   DOB: 07/08/1986, 28 y.o.   MRN: [id number] D. Orders received for patients discharge.A. Discharge instructions reviewed with patient at length, copy of AVS provided to patient and reviewed. Rx given as well as free 14 day supply of medications. Reviewed crisis services at length. Pt denies any SI/HI/A/V Hallucinations. No signs of acute decompensation. All belongings returned to patient at discharge. R.Pt escorted from unit to the lobby to the care of [facility] team.

## 2014-05-02 NOTE — Progress Notes (Signed)
D: Pt in bed resting with eyes closed. Respirations even and unlabored. Pt appears to be in no signs of distress at this time. A: Q15min checks remains for this pt. R: Pt remains safe at this time.   

## 2014-05-02 NOTE — Tx Team (Signed)
  Interdisciplinary Treatment Plan Update   Date Reviewed:  05/02/2014  Time Reviewed:  8:09 AM  Progress in Treatment:   Attending groups: Yes Participating in groups: Yes Taking medication as prescribed: Yes  Tolerating medication: Yes Family/Significant other contact made: Yes  Patient understands diagnosis: Yes  Discussing patient identified problems/goals with staff: Yes Medical problems stabilized or resolved: Yes Denies suicidal/homicidal ideation: Yes Patient has not harmed self or others: Yes  For review of initial/current patient goals, please see plan of care.  Estimated Length of Stay:  D/C today  Reason for Continuation of Hospitalization:   New Problems/Goals identified:  N/A  Discharge Plan or Barriers:   return to shelter, follow up [facility]  Additional Comments:  Attendees:  Signature: Thedore MinsMojeed Akintayo, MD 05/02/2014 8:09 AM   Signature: Richelle Itood Heddy Vidana, LCSW 05/02/2014 8:09 AM  Signature: Fransisca KaufmannLaura Davis, NP 05/02/2014 8:09 AM  Signature: Joslyn Devonaroline Beaudry, RN 05/02/2014 8:09 AM  Signature: Liborio NixonPatrice White, RN 05/02/2014 8:09 AM  Signature:  05/02/2014 8:09 AM  Signature:   05/02/2014 8:09 AM  Signature:    Signature:    Signature:    Signature:    Signature:    Signature:      Scribe for Treatment Team:   Richelle Itood Joeziah Voit, LCSW  05/02/2014 8:09 AM

## 2014-05-02 NOTE — Progress Notes (Signed)
[facility] :  Will you be returning to the same living situation after discharge: No. Plans to check into shelter At discharge, do you have transportation home?:Yes,  ACT team Do you have the ability to pay for your medications:Yes,  ACT team  Release of information consent forms completed and in the chart;  Patient's signature needed at discharge.  Patient to Follow up at: Follow-up Information   [facility] (Call this number to make a follow up appointment with Dr Amanda PeaGramig for your cast)    Contact information:   [address]   [phone]      Follow up with [facility] team. (They will pick you up at 1PM the day of your discharge.)    Contact information:   [address]  [place] [[phone]] [phone]      Patient denies SI/HI:   Yes,  yes    Safety Planning and Suicide Prevention discussed:  Yes,  yes  Daryel Geraldorth, Cayleb Jarnigan B 05/02/2014, 8:15 AM

## 2014-05-05 NOTE — Progress Notes (Signed)
Patient Discharge Instructions:  After Visit Summary (AVS):   Faxed to:  05/05/14 Discharge Summary Note:   Faxed to:  05/05/14 Psychiatric Admission Assessment Note:   Faxed to:  05/05/14 Suicide Risk Assessment - Discharge Assessment:   Faxed to:  05/05/14 Faxed/Sent to the Next Level Care provider:  05/05/14 Faxed to [facility] @ [phone]  Jerelene ReddenSheena E [place], 05/05/2014, 3:40 PM

## 2014-05-09 ENCOUNTER — Encounter (HOSPITAL_COMMUNITY): Payer: Self-pay | Admitting: Emergency Medicine

## 2014-05-09 ENCOUNTER — Emergency Department (HOSPITAL_COMMUNITY)
Admission: EM | Admit: 2014-05-09 | Discharge: 2014-05-15 | Disposition: A | Discharge status: Home / Self Care | Payer: No Typology Code available for payment source | Attending: Emergency Medicine | Admitting: Emergency Medicine

## 2014-05-09 DIAGNOSIS — J45909 Unspecified asthma, uncomplicated: Secondary | ICD-10-CM | POA: Insufficient documentation

## 2014-05-09 DIAGNOSIS — O99345 Other mental disorders complicating the puerperium: Secondary | ICD-10-CM

## 2014-05-09 DIAGNOSIS — F209 Schizophrenia, unspecified: Secondary | ICD-10-CM | POA: Insufficient documentation

## 2014-05-09 DIAGNOSIS — F411 Generalized anxiety disorder: Secondary | ICD-10-CM | POA: Insufficient documentation

## 2014-05-09 DIAGNOSIS — F29 Unspecified psychosis not due to a substance or known physiological condition: Secondary | ICD-10-CM | POA: Diagnosis present

## 2014-05-09 DIAGNOSIS — F22 Delusional disorders: Secondary | ICD-10-CM

## 2014-05-09 DIAGNOSIS — F319 Bipolar disorder, unspecified: Secondary | ICD-10-CM | POA: Insufficient documentation

## 2014-05-09 DIAGNOSIS — F410 Panic disorder [episodic paroxysmal anxiety] without agoraphobia: Secondary | ICD-10-CM | POA: Insufficient documentation

## 2014-05-09 DIAGNOSIS — F1910 Other psychoactive substance abuse, uncomplicated: Secondary | ICD-10-CM | POA: Diagnosis present

## 2014-05-09 DIAGNOSIS — F531 Puerperal psychosis: Secondary | ICD-10-CM | POA: Diagnosis present

## 2014-05-09 LAB — BASIC METABOLIC PANEL
Anion gap: 13 (ref 5–15)
BUN: 6 mg/dL (ref 6–23)
CALCIUM: 9.5 mg/dL (ref 8.4–10.5)
CO2: 24 meq/L (ref 19–32)
CREATININE: 0.58 mg/dL (ref 0.50–1.10)
Chloride: 103 mEq/L (ref 96–112)
GFR calc Af Amer: >90 mL/min (ref >90)
GFR calc non Af Amer: >90 mL/min (ref >90)
GLUCOSE: 107 mg/dL — AB (ref 70–99)
Potassium: 4.1 mEq/L (ref 3.7–5.3)
Sodium: 140 mEq/L (ref 137–147)

## 2014-05-09 LAB — CBC WITH DIFFERENTIAL/PLATELET
Basophils Absolute: 0 10*3/uL (ref 0.0–0.1)
Basophils Relative: 0 % (ref 0–1)
Eosinophils Absolute: 0.1 10*3/uL (ref 0.0–0.7)
Eosinophils Relative: 1 % (ref 0–5)
HEMATOCRIT: 37.2 % (ref 36.0–46.0)
Hemoglobin: 12.6 g/dL (ref 12.0–15.0)
Lymphocytes Relative: 21 % (ref 12–46)
Lymphs Abs: 1.7 10*3/uL (ref 0.7–4.0)
MCH: 30.5 pg (ref 26.0–34.0)
MCHC: 33.9 g/dL (ref 30.0–36.0)
MCV: 90.1 fL (ref 78.0–100.0)
MONO ABS: 0.6 10*3/uL (ref 0.1–1.0)
Monocytes Relative: 7 % (ref 3–12)
Neutro Abs: 5.7 10*3/uL (ref 1.7–7.7)
Neutrophils Relative %: 71 % (ref 43–77)
Platelets: 286 10*3/uL (ref 150–400)
RBC: 4.13 MIL/uL (ref 3.87–5.11)
RDW: 12.9 % (ref 11.5–15.5)
WBC: 8.1 10*3/uL (ref 4.0–10.5)

## 2014-05-09 LAB — URINALYSIS, ROUTINE W REFLEX MICROSCOPIC
Bilirubin Urine: NEGATIVE
Glucose, UA: NEGATIVE mg/dL
HGB URINE DIPSTICK: NEGATIVE
Ketones, ur: NEGATIVE mg/dL
Leukocytes, UA: NEGATIVE
Nitrite: NEGATIVE
PROTEIN: NEGATIVE mg/dL
Specific Gravity, Urine: 1.022 (ref 1.005–1.030)
UROBILINOGEN UA: 0.2 mg/dL (ref 0.0–1.0)
pH: 6 (ref 5.0–8.0)

## 2014-05-09 LAB — RAPID URINE DRUG SCREEN, HOSP PERFORMED
AMPHETAMINES: NOT DETECTED
Barbiturates: NOT DETECTED
Benzodiazepines: NOT DETECTED
Cocaine: NOT DETECTED
Opiates: NOT DETECTED
TETRAHYDROCANNABINOL: POSITIVE — AB

## 2014-05-09 LAB — ETHANOL: Alcohol, Ethyl (B): <11 mg/dL (ref 0–11)

## 2014-05-09 LAB — POC URINE PREG, ED: Preg Test, Ur: NEGATIVE

## 2014-05-09 LAB — ACETAMINOPHEN LEVEL: Acetaminophen (Tylenol), Serum: <15 ug/mL (ref 10–30)

## 2014-05-09 LAB — SALICYLATE LEVEL: Salicylate Lvl: <2 mg/dL — ABNORMAL LOW (ref 2.8–20.0)

## 2014-05-09 MED ORDER — NICOTINE 21 MG/24HR TD PT24
21.0000 mg | MEDICATED_PATCH | Freq: Every day | TRANSDERMAL | Status: DC
  Administered 2014-05-09 – 2014-05-14 (×5): 21 mg via TRANSDERMAL
  Filled 2014-05-09 (×5): qty 1

## 2014-05-09 MED ORDER — WHITE PETROLATUM GEL
Status: DC | PRN
  Administered 2014-05-09: 20:00:00 via TOPICAL
  Filled 2014-05-09: qty 5

## 2014-05-09 MED ORDER — IBUPROFEN 200 MG PO TABS: 600.0000 mg | ORAL_TABLET | Freq: Three times a day (TID) | ORAL | Status: DC | PRN

## 2014-05-09 MED ORDER — OLANZAPINE 5 MG PO TABS
15.0000 mg | ORAL_TABLET | Freq: Every day | ORAL | Status: DC
  Administered 2014-05-09 – 2014-05-12 (×3): 15 mg via ORAL
  Filled 2014-05-09 (×8): qty 1

## 2014-05-09 MED ORDER — ACETAMINOPHEN 325 MG PO TABS
650.0000 mg | ORAL_TABLET | ORAL | Status: DC | PRN
  Administered 2014-05-09 – 2014-05-14 (×3): 650 mg via ORAL
  Filled 2014-05-09 (×3): qty 2

## 2014-05-09 MED ORDER — DIVALPROEX SODIUM 500 MG PO DR TAB
500.0000 mg | DELAYED_RELEASE_TABLET | Freq: Two times a day (BID) | ORAL | Status: DC
  Administered 2014-05-09 – 2014-05-13 (×5): 500 mg via ORAL
  Filled 2014-05-09 (×7): qty 1

## 2014-05-09 MED ORDER — LORAZEPAM 1 MG PO TABS
1.0000 mg | ORAL_TABLET | Freq: Three times a day (TID) | ORAL | Status: DC | PRN
  Administered 2014-05-09 – 2014-05-13 (×4): 1 mg via ORAL
  Filled 2014-05-09 (×8): qty 1

## 2014-05-09 MED ORDER — ZOLPIDEM TARTRATE 5 MG PO TABS
5.0000 mg | ORAL_TABLET | Freq: Every evening | ORAL | Status: DC | PRN
  Administered 2014-05-10 – 2014-05-14 (×3): 5 mg via ORAL
  Filled 2014-05-09 (×4): qty 1

## 2014-05-09 MED ORDER — ONDANSETRON HCL 4 MG PO TABS: 4.0000 mg | ORAL_TABLET | Freq: Three times a day (TID) | ORAL | Status: DC | PRN

## 2014-05-09 MED ORDER — BENZTROPINE MESYLATE 1 MG PO TABS
1.0000 mg | ORAL_TABLET | Freq: Every day | ORAL | Status: DC
  Administered 2014-05-09 – 2014-05-14 (×5): 1 mg via ORAL
  Filled 2014-05-09 (×6): qty 1

## 2014-05-09 MED ORDER — ALUM & MAG HYDROXIDE-SIMETH 200-200-20 MG/5ML PO SUSP
30.0000 mL | ORAL | Status: DC | PRN
  Administered 2014-05-15 (×2): 30 mL via ORAL
  Filled 2014-05-09 (×2): qty 30
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
## 2014-05-09 NOTE — Progress Notes (Signed)
Per, [name] patient meets criteria for inpatient hospitalization.

## 2014-05-09 NOTE — ED Notes (Addendum)
Pt has been changed into wine colored scrubs, urine collected, and pt hand belongings have both been wanded, belongings placed in locker number  [phone] .
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
## 2014-05-09 NOTE — ED Notes (Addendum)
Patient behavior is bizarre. Labile mood, confusion, paranoia, FOI. Patient wandering hallway, entering other rooms. Denies SI, HI, AVH. Speech disorganized.  Encouragement offered. Given snack and beverage.   Q 15 safety checks continue.
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
## 2014-05-09 NOTE — Progress Notes (Signed)
Incoming staff will follow up with completing the First Exam and Recommendations.

## 2014-05-09 NOTE — ED Provider Notes (Signed)
CSN: [id number]     Arrival date & time 05/09/14  1123 History   First MD Initiated Contact with Patient 05/09/14 1132     Chief Complaint  Patient presents with  . IVC      (Consider location/radiation/quality/duration/timing/severity/associated sxs/prior Treatment) HPI  This is a 28 year old female with history of schizophrenia and bipolar disorder who presents with IVC paperwork.  I reviewed the IVC paperwork.  He was thought not community act team.  Patient is homeless and lives in a shelter.  She was found wandering this morning and yelling things that did not make any sense.  She reportedly was started on the psychiatric medication regimen but quit he does not seem to be working."  On my evaluation, patient denies any complaints.  She denies SI and HI.  She states "I do not understand I am here."  However, she was noted to be talking to herself and screaming about a baby.  Denies hearing voices but appears to be responding to internal stimulus.  Patient is a recently admitted to behavioral health and had a prolonged stay from July 10 until July 26.  Of note, patient also has a cast on her left arm from a humerus fracture sustained while in jail.  Past Medical History  Diagnosis Date  . Asthma   . Anxiety   . Panic attacks   . Schizophrenia   . Bipolar disorder    History reviewed. No pertinent past surgical history. No family history on file. History  Substance Use Topics  . Smoking status: Current Every Day Smoker  . Smokeless tobacco: Not on file  . Alcohol Use: Yes   OB History   Grav Para Term Preterm Abortions TAB SAB Ect Mult Living                 Review of Systems  Constitutional: Negative for fever.  Respiratory: Negative for chest tightness and shortness of breath.   Cardiovascular: Negative for chest pain.  Gastrointestinal: Negative for nausea, vomiting and abdominal pain.  Skin: Positive for wound.  Neurological: Negative for headaches.   Psychiatric/Behavioral: Positive for agitation. Negative for suicidal ideas, hallucinations and confusion.  All other systems reviewed and are negative.     Allergies  Review of patient's allergies indicates no known allergies.  Home Medications   Prior to Admission medications   Medication Sig Start Date End Date Taking? Authorizing Provider  benztropine (COGENTIN) 1 MG tablet Take 1 tablet (1 mg total) by mouth at bedtime. 05/02/14   Fransisca KaufmannLaura Davis, NP  divalproex (DEPAKOTE) 500 MG DR tablet Take 1 tablet (500 mg total) by mouth 2 (two) times daily after a meal. 05/02/14   Fransisca KaufmannLaura Davis, NP  ibuprofen (ADVIL,MOTRIN) 200 MG tablet Take 400-600 mg by mouth every 6 (six) hours as needed for mild pain or moderate pain.    Historical Provider, MD  OLANZapine (ZYPREXA) 15 MG tablet Take 1 tablet (15 mg total) by mouth at bedtime. 05/02/14   Fransisca KaufmannLaura Davis, NP   BP 155/99  Pulse 85  Temp(Src) 98.1 F (36.7 C) (Oral)  Resp 18  SpO2 98% Physical Exam  Nursing note and vitals reviewed. Constitutional: She is oriented to person, place, and time. No distress.  disheveled  HENT:  Head: Normocephalic and atraumatic.  Cardiovascular: Normal rate, regular rhythm and normal heart sounds.   Pulmonary/Chest: Effort normal and breath sounds normal. No respiratory distress. She has no wheezes.  Musculoskeletal:  Left upper extremity in full arm cast  Neurological:  She is alert and oriented to person, place, and time.  Skin: Skin is warm and dry.  Psychiatric:  Tangential speech, appears to be responding to internal stimuli    ED Course  Procedures (including critical care time) Labs Review Labs Reviewed  URINALYSIS, ROUTINE W REFLEX MICROSCOPIC - Abnormal; Notable for the following:    APPearance CLOUDY (*)    All other components within normal limits  CBC WITH DIFFERENTIAL  BASIC METABOLIC PANEL  URINE RAPID DRUG SCREEN (HOSP PERFORMED)  ETHANOL  ACETAMINOPHEN LEVEL  SALICYLATE LEVEL  POC  URINE PREG, ED    Imaging Review No results found.   EKG Interpretation None      MDM   Final diagnoses:  None    Lab work obtained.  Patient has IVC paperwork.  Will have TTS consult.    Shon Baton, MD 05/09/14 1226

## 2014-05-10 DIAGNOSIS — F29 Unspecified psychosis not due to a substance or known physiological condition: Secondary | ICD-10-CM

## 2014-05-10 NOTE — Consult Note (Signed)
  Review of Systems  Unable to perform ROS: acuity of condition    

## 2014-05-10 NOTE — Progress Notes (Signed)
Patient currently resting in room. Patient refusing vitals signs and medications today but remains relatively calm. Will continue to monitor patient for safety.
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
## 2014-05-10 NOTE — ED Notes (Signed)
Patient continues to pace in her room. Disorganized thought/speech.  Attempted to get vitals. Patient restless, yelling, pulled cuff off of her arm.  Encouragement offered. Given Ambien. Given sandwich. Q 15 safety checks continue.

## 2014-05-10 NOTE — Progress Notes (Signed)
Pt mood is despondent and her affect is sad/blunted. Pt is aloof and apathetic towards staff and is refusing vital signs and medications. If questioned, pt becomes irritable, defensive and shouts at staff to get away from her. Pt behavior is somewhat bizarre and she seems to be responding to internal stimuli, although she has been safe and is otherwise cooperative with staff. Pt is unable to answer assessment questions. Writer will continue to monitor.

## 2014-05-10 NOTE — ED Notes (Signed)
Patient alert. Pacing. Denies SI, HI, AVH.   Encouraged to eat and to improve hygiene. Given Cogentin, Zyprexa.  Q 15 safety checks continue.

## 2014-05-11 MED ORDER — LORAZEPAM 2 MG/ML IJ SOLN
2.0000 mg | Freq: Once | INTRAMUSCULAR | Status: AC
  Administered 2014-05-11: 2 mg via INTRAMUSCULAR
  Filled 2014-05-11: qty 1

## 2014-05-11 MED ORDER — DOCUSATE SODIUM 100 MG PO CAPS
100.0000 mg | ORAL_CAPSULE | Freq: Every day | ORAL | Status: DC
  Administered 2014-05-11 – 2014-05-15 (×4): 100 mg via ORAL
  Filled 2014-05-11 (×5): qty 1
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
## 2014-05-11 NOTE — ED Notes (Signed)
Pt pacing in hallways, appears anxious, offered pt PO Ativan prn dose, pt refused, states "You got the wrong brain, I refuse, you need to talk to the right doctor right doctor.", MD and NP notified that pt becoming irritable and anxious but refusing to take PO meds, no new orders given at this time.

## 2014-05-11 NOTE — ED Notes (Signed)
Patient pacing. States "I can't help it". Patient sliding paper and cloth under closed doors. Directed back to her room. Remains restless.

## 2014-05-11 NOTE — BH Assessment (Addendum)
This Clinical research associatewriter followed up with the following facilities that patient has been referred to.  [facility]-Per Jerilynn Somalvin pt is declined.  Per Jerilynn Somalvin they are unable to appropriately treat patient. [facility]Per [facility] -@capacity . [facility]- Per Cordelia PenSherry declined as she is not appropriate for treatment.    Glorious PeachNajah Drew Herman, MS, LCASA Assessment Counselor
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
## 2014-05-11 NOTE — ED Notes (Signed)
Pt refused to allow this writer to get her pm vitals

## 2014-05-11 NOTE — ED Notes (Signed)
Pt continues to pace hallways and expose self, refused night time meds,  Injection given.

## 2014-05-11 NOTE — ED Notes (Signed)
Patient in hall again stating "I can't help it. I can't stay in my room".

## 2014-05-11 NOTE — ED Notes (Addendum)
Pt refused night time meds will try  again later.

## 2014-05-11 NOTE — ED Notes (Signed)
Pt provided with coloring and word search pages.
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
## 2014-05-11 NOTE — Consult Note (Signed)
  Review of Systems  Unable to perform ROS: acuity of condition    

## 2014-05-11 NOTE — ED Notes (Signed)
Patient in hall. Rolling crayons under closed doors. Again asked to return to her room. Reminded patient of late hour and asked not to disturb the unit.  q 15 safety checks continue.

## 2014-05-12 ENCOUNTER — Encounter (HOSPITAL_COMMUNITY): Payer: Self-pay | Admitting: Psychiatry

## 2014-05-12 MED ORDER — OLANZAPINE 10 MG IM SOLR
5.0000 mg | Freq: Once | INTRAMUSCULAR | Status: AC
  Administered 2014-05-12: 5 mg via INTRAMUSCULAR
  Filled 2014-05-12: qty 10

## 2014-05-12 MED ORDER — LORAZEPAM 2 MG/ML IJ SOLN
INTRAMUSCULAR | Status: AC
Start: 2014-05-12 — End: 2014-05-13
  Filled 2014-05-12: qty 1

## 2014-05-12 MED ORDER — DIPHENHYDRAMINE HCL 50 MG/ML IJ SOLN
25.0000 mg | Freq: Once | INTRAMUSCULAR | Status: AC
  Administered 2014-05-12: 25 mg via INTRAMUSCULAR

## 2014-05-12 MED ORDER — LORAZEPAM 2 MG/ML IJ SOLN
2.0000 mg | Freq: Once | INTRAMUSCULAR | Status: AC
  Administered 2014-05-12: 2 mg via INTRAMUSCULAR

## 2014-05-12 MED ORDER — DIPHENHYDRAMINE HCL 50 MG/ML IJ SOLN
INTRAMUSCULAR | Status: AC
  Filled 2014-05-12: qty 1

## 2014-05-12 NOTE — ED Notes (Signed)
Patient woke up and came out of room. Patient waiting to use the restroom. Patient given milk her request. Patient ambulating in front of nurses station walking back and forth. Encouragement and support provided and safety maintain.
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
## 2014-05-12 NOTE — ED Notes (Signed)
Pt refused Noon vitals.
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
## 2014-05-12 NOTE — ED Notes (Signed)
Pt has also been trying to get thru exit doors. Discussed with NP and orders received. Pt willing took the IM medications from Clinical research associatewriter and teammate. She tolerated it well.
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
## 2014-05-12 NOTE — ED Notes (Signed)
Pt provided with coloring pages and word puzzles. 

## 2014-05-13 ENCOUNTER — Emergency Department (HOSPITAL_COMMUNITY): Payer: PRIVATE HEALTH INSURANCE

## 2014-05-13 ENCOUNTER — Encounter (HOSPITAL_COMMUNITY): Payer: Self-pay | Admitting: Psychiatry

## 2014-05-13 MED ORDER — LORAZEPAM 1 MG PO TABS
2.0000 mg | ORAL_TABLET | Freq: Three times a day (TID) | ORAL | Status: DC | PRN
  Filled 2014-05-13 (×2): qty 2

## 2014-05-13 MED ORDER — OLANZAPINE 10 MG PO TABS
10.0000 mg | ORAL_TABLET | Freq: Two times a day (BID) | ORAL | Status: DC
  Administered 2014-05-13 – 2014-05-14 (×2): 10 mg via ORAL
  Filled 2014-05-13 (×4): qty 1

## 2014-05-13 MED ORDER — LORAZEPAM 1 MG PO TABS
2.0000 mg | ORAL_TABLET | Freq: Once | ORAL | Status: AC
  Administered 2014-05-13: 2 mg via ORAL
  Filled 2014-05-13: qty 2

## 2014-05-13 MED ORDER — OLANZAPINE 5 MG PO TBDP
5.0000 mg | ORAL_TABLET | ORAL | Status: AC
  Administered 2014-05-13: 5 mg via ORAL
  Filled 2014-05-13: qty 1

## 2014-05-13 MED ORDER — OLANZAPINE 5 MG PO TBDP
5.0000 mg | ORAL_TABLET | Freq: Two times a day (BID) | ORAL | Status: DC | PRN
  Filled 2014-05-13: qty 1

## 2014-05-13 MED ORDER — DIVALPROEX SODIUM 500 MG PO DR TAB
625.0000 mg | DELAYED_RELEASE_TABLET | Freq: Two times a day (BID) | ORAL | Status: DC
  Administered 2014-05-14 (×2): 625 mg via ORAL
  Filled 2014-05-13 (×7): qty 1

## 2014-05-13 NOTE — ED Notes (Signed)
Ortho tech into remove cast

## 2014-05-13 NOTE — ED Notes (Signed)
In room talking w/ dr Lolly Mustachearfeen and Catha Nottinghamjamison

## 2014-05-13 NOTE — ED Notes (Addendum)
Contact ortho to replace cast VORB dr Gwendolyn Grantwalden

## 2014-05-13 NOTE — ED Notes (Signed)
Patient requested a shower safety measured in place and discuss with patient. Patient took a shower and safety maintain.

## 2014-05-13 NOTE — ED Notes (Addendum)
Pt up in bathroom and took a shower w/o informing staff and has gotten her cast wet. Pt refusing to leave the shower.  Will contact EDP when she gets out of the bathroom.

## 2014-05-13 NOTE — ED Notes (Signed)
Patient took her night medications with no resistance. Respirations equal and unlabored. Skin warm and dry, No acute distress noted. Safety maintain.

## 2014-05-13 NOTE — ED Notes (Addendum)
Up in hall walking around talking/laughing to self, nad.  Pt is aware that her cast will be replaced.

## 2014-05-13 NOTE — ED Notes (Signed)
Pt continues to pace hallways and expose self by ripping her scrub top down the middle. Patient directed to her room by writer and assisted with changing her scrub top. Encouragement and support provided and safety maintain.

## 2014-05-13 NOTE — ED Notes (Signed)
Pt back from x-ray, [facility]

## 2014-05-13 NOTE — ED Notes (Signed)
In day room talking w/ other pt 

## 2014-05-13 NOTE — ED Notes (Signed)
Dr Gwendolyn Grantwalden into see

## 2014-05-13 NOTE — ED Notes (Signed)
Ortho tech in to cut cast w/ assit of mHt, pt declined to take the additional ativan, and is allowing the [facility] removal [facility].

## 2014-05-13 NOTE — Consult Note (Signed)
Patient seen, evaluated by me. Patient is psychotic and needs inpatient care 

## 2014-05-13 NOTE — ED Notes (Signed)
Patient tore scrub top and it walking around nurses station without a shirt. Patient given new shirt which she said he would not put on stating it has lice. Patient brought new top.

## 2014-05-13 NOTE — ED Notes (Signed)
Pt would not allow ortho tech to remove the cast

## 2014-05-13 NOTE — Consult Note (Signed)
Dx: left closed distal humerus fracture-repeat casting performed today without complications Patient Active Problem List   Diagnosis Date Noted  . Bipolar I disorder, most recent episode (or current) manic 04/20/2014  . Delusional disorder 04/19/2014  . Unspecified episodic mood disorder 04/19/2014  . Cannabis abuse 04/19/2014  . Psychotic disorder 04/18/2014  . Polysubstance abuse 05/11/2013  . Alcohol withdrawal 05/11/2013   See dictation #[id number] Merdith Boyd Md

## 2014-05-13 NOTE — ED Notes (Signed)
Patient alert. Pacing. Denies SI, HI, AVH.
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
## 2014-05-13 NOTE — ED Provider Notes (Signed)
Got into shower with cast on. Repeat films show callus formation. Dr. Gramig will help with cast replacement - heAmanda Pea helped last time. Will cut cast off to allow to dry while awaiting Dr. Amanda PeaGramig.  I have reviewed all labs and imaging and considered them in my medical decision making.       DG Humerus Right (Final result)  Result time: 05/13/14 13:51:24    Final result by Rad Results In Interface (05/13/14 13:51:24)    Narrative:   CLINICAL DATA: Cast change for distal humeral fracture.  EXAM: RIGHT HUMERUS - 2+ VIEW  COMPARISON: 04/12/2014 04/11/2014.  FINDINGS: The comminuted and displaced fracture of the distal humeral diaphysis shows some interval surrounding callus formation. The fracture continues to show significant displacement and angulation.  IMPRESSION: Interval development of callus surrounding the distal humeral fracture. The fracture continues to show significant angulation and displacement.   Electronically Signed By: Irish LackGlenn Yamagata M.D. On: 05/13/2014 13:51     Elwin MochaBlair Jansel Vonstein, MD 05/13/14 [phone]

## 2014-05-13 NOTE — ED Notes (Signed)
Sleeping, easily aroused.  Pt declined to allow VS to be taken or take her depakote.  Pt has not eaten supper.

## 2014-05-13 NOTE — ED Notes (Signed)
Pt ambulatory to x-ray w/ [facility]t and security

## 2014-05-13 NOTE — ED Notes (Signed)
Sleeping in the activity room

## 2014-05-13 NOTE — Consult Note (Signed)
NAMRicharda [name]:  Sonneborn, Antonieta              ACCOUNT NO.:  [id number]  MEDICAL RECORD NO.:  [id number]  LOCATION:  [id number]                        [facility]  PHYSICIAN:  Dionne AnoWilliam M. Cypress Hinkson, M.D.DATE OF BIRTH:  [birth year]  DATE OF CONSULTATION:  05/13/2014 DATE OF DISCHARGE:                                CONSULTATION   I had the pleasure to see Redgie GrayerJennifer Regnier for a consult.  [name] is a 28 year old female who we know well from her left upper extremity injury.  Her notes are well detailed.  I have followed her chart at great length and she has been seen by myself secondary to a distal humerus fracture pain and has had admission for psychiatric services.  To summarize, Ms. Elvina SidleKeene sustained a supracondylar humerus fracture as the notes detail early in July.  The patient was seen by myself, and I felt it would be very dangerous to perform surgical services for her given her psychiatric state.  I made this very clear in my note and my reservations about an aggressive surgical procedure given her inability to be consentable as well as her noncompliance long psychiatric history and other issues.  She ultimately was casted.  We saw her back in the office the last week of July and replaced a new cast on her.  At that time, she was making callus and although she had angulation and her arm appeared to be healing the fracture.  I feel that she would be better suited to heal the fracture and have some degree of angulation rather than attempt an aggressive surgical procedure in the phase of normal neurologic and motor function.  My reasoning was due to her difficult disposition in terms of her psychiatric needs.  The patient has been followed closely, I have looked at her notes on a 4-5 times a week basis and should note that she has unfortunately failed her psychiatric treatment.  This is in my estimation.  She of course is not a danger to herself, but certainly has huge challenges in terms of being  able to function appropriately.  Unfortunately, she has preferred a difficult living environment when she is outside the hospital or care of the authorities.  I have reviewed her psych notes and do note that she was discharged from psych care only to land back in the emergency room within a week.  I was called to see her as she take took a shower and completely soaked her cast.  This cast of course was placed a week and a half ago in our office.  I know Ms. Legrand ComoKeene's predicament well.  [facility]  I have reviewed these notes.  PAST MEDICAL AND SURGICAL HISTORY:  Reviewed.  CURRENT MEDICINES:  Reviewed.  ALLERGIES:  No known drug allergies.  PHYSICAL EXAMINATION:  Shows a normal left upper extremity sensory exam. She has intact flexion and extension.  No radial nerve, median nerve, or ulnar nerve injury.  No compartment syndrome.  Her fracture site is evaluated at length.  We very carefully removed her long-arm cast, which was saturated in water.  Fortunately, her skin health is stable.  She  has good stability at her fracture site.  There is no looseness.  She actually has a tremendous amount of callus formation, which is encouraging.  She is nontender.  Her shoulder and wrist are nontender, and she moves her wrist, forearm, and hand without any difficulty.  Her right upper extremity is intact without complicating feature, injury, or other process.  Her lower extremity examination is benign. Chest has equal expansion.  She is not short of breath.  Back and neck are nontender.  X-ray show a distal humerus fracture outside of the joint with angulation and callus formation.  In my opinion, there is evidence of bony healing.  Although she has angulation, I would accept this given the very significant care issues.  IMPRESSION: 1. Distal humerus fracture sustained 1st week of July with callus     formation and  stability.  There is angulation, however, we have     chosen to accept some degree of malunion in this very difficult     patient. 2. Polysubstance abuse. 3. Psychotic disorder. 4. History of alcohol withdrawal. 5. History of delusional disorder, episodic mood disorder, bipolar     disorder.  PLAN:  I placed her in a new long-arm cast.  She tolerated this well. There were no complicating features.  She remains neurovascularly intact and she understands that she has to follow up in our office as scheduled.  She plans to make this followup.  I feel that my initial recommendations are overwhelmingly substantiated with the course of her care outcome over the last month.  It has been difficult at best to try and stabilize her mood and certainly she still presents tremendous difficulties in this regard.  I feel that continued conservative measures are the rule.  I would highly recommend aggressive measures to continue to try to treat her psychiatric dysfunction.  We will continue to follow her fracture and treat it with the most care. At the present time, she is stable from my standpoint orthopedically and we will continue to follow her.  All questions have been encouraged and answered.  Nursing staff and MD staff are well aware of her situation and my recommendations.     Dionne Ano. Amanda Pea, M.D.     [facility]  D:  05/13/2014  T:  05/13/2014  Job:  [id number]  cc:   Kathi Ludwig T. Lolly Mustache, M.D. Dr. Jannifer Franklin

## 2014-05-13 NOTE — ED Notes (Addendum)
Cast is dry, sling removed and placed in her locker, pt up in hall and room, nad.  Pt encouraged to keep moving fingers

## 2014-05-13 NOTE — ED Notes (Addendum)
Up in hall, pacing, talking to self.  Pt agreed to take her medication PO.  Lt hand swollen, pt refuses to allow anyone to touch her, yelling at times.  Pt is able to move fingers lt hand freely and has been encouraged to keep moving fingers and to keep her arm elevated when she is sitting/laying down.

## 2014-05-13 NOTE — ED Notes (Signed)
Walking in the hall, up to the bathroom

## 2014-05-13 NOTE — ED Notes (Signed)
Up to the bathroom 

## 2014-05-13 NOTE — ED Notes (Addendum)
Dr Amanda PeaGramig at bedside to re-cast arm.  Pt tolerating well, remains pleasant and cooperative

## 2014-05-13 NOTE — ED Notes (Signed)
Pt took 250mg  of depakote. Dr. Rosezella Rumpfefused the other 500mg s due to color

## 2014-05-13 NOTE — ED Notes (Signed)
Pt refused to take ativan PO, said she was too sleepy all ready

## 2014-05-13 NOTE — ED Notes (Signed)
Dr Amanda Peagramig to see per ortho tech

## 2014-05-13 NOTE — ED Notes (Addendum)
Up in hall, laughing and talking to self, more animated

## 2014-05-13 NOTE — ED Notes (Addendum)
Pt up in hall, cooperative, talking, agreed to let this writer assess her arm.  Lt hand/arm swollen, lower arm/hand cool to touch w/ delayed cap refill, faint radial pulse palpated.  Pt encouraged to sit down and elevate her arm, and to move her fingers.  Pt declined to sit dow, but is moving her fingers.
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
## 2014-05-13 NOTE — ED Notes (Signed)
Cast finished, sling in place.  Dr Amanda PeaGramig and pt are aware that she can not have the sling in this area.  mHt to remain w/ pt until the cast is dry.

## 2014-05-14 ENCOUNTER — Encounter (HOSPITAL_COMMUNITY): Payer: Self-pay | Admitting: Psychiatry

## 2014-05-14 MED ORDER — OLANZAPINE 10 MG IM SOLR
10.0000 mg | Freq: Once | INTRAMUSCULAR | Status: AC
  Administered 2014-05-14: 10 mg via INTRAMUSCULAR
  Filled 2014-05-14: qty 10

## 2014-05-14 MED ORDER — CLOTRIMAZOLE 1 % EX CREA
1.0000 "application " | TOPICAL_CREAM | Freq: Two times a day (BID) | CUTANEOUS | Status: DC
  Administered 2014-05-14: 1 via TOPICAL
  Filled 2014-05-14: qty 15

## 2014-05-14 MED ORDER — LORAZEPAM 2 MG/ML IJ SOLN
2.0000 mg | Freq: Once | INTRAMUSCULAR | Status: AC
Start: 2014-05-14 — End: 2014-05-14
  Administered 2014-05-14: 2 mg via INTRAMUSCULAR
  Filled 2014-05-14: qty 1

## 2014-05-14 MED ORDER — FLUCONAZOLE 100 MG PO TABS
200.0000 mg | ORAL_TABLET | Freq: Once | ORAL | Status: AC
  Administered 2014-05-14: 200 mg via ORAL
  Filled 2014-05-14: qty 2

## 2014-05-14 MED ORDER — DIPHENHYDRAMINE HCL 50 MG/ML IJ SOLN
50.0000 mg | Freq: Once | INTRAMUSCULAR | Status: AC
  Administered 2014-05-14: 50 mg via INTRAMUSCULAR
  Filled 2014-05-14: qty 1

## 2014-05-14 NOTE — ED Notes (Signed)
In room eating snack 
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
## 2014-05-14 NOTE — ED Notes (Signed)
Calmer, but still difficult to redirect. Pt did agree to the medication and allow it to be administered w/o difficulty.

## 2014-05-14 NOTE — ED Notes (Signed)
Up in hall, talking to self and staff, cooperative and redirectable, but angry at times.

## 2014-05-14 NOTE — ED Notes (Signed)
Up in hall walking around 

## 2014-05-14 NOTE — ED Notes (Signed)
Dr arfeen and jamison into see 

## 2014-05-14 NOTE — Progress Notes (Addendum)
3:24pm. Per [facility] RN[facility].  _______  [facility] Berkley Harveyauth code completed with clinician Sonya: #[id number]: #[id number]. CSW completed verbal portion of [facility] application with Bobby.  Mariann LasterAlexandra Akeila Lana LCSWA,     ED CSW  phone: [phone] 10:18am

## 2014-05-14 NOTE — ED Notes (Addendum)
Walking in hall/room, nad, talking to self and staff, redirectable, but growls at times

## 2014-05-14 NOTE — ED Notes (Signed)
Up to the bathroom -flushing magazine pictures down the toilet

## 2014-05-14 NOTE — ED Notes (Signed)
Up in hall, nad 
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
## 2014-05-14 NOTE — ED Notes (Addendum)
Up walking in the hall in front of the NS, talking to self at times, cooperative, asking the staff for a stapler, and telling up that we have her "stuff" in the NS. Redirectable, nad.
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
## 2014-05-14 NOTE — ED Notes (Signed)
Up in the hall walking around 

## 2014-05-14 NOTE — Progress Notes (Signed)
1755[name]   Jeanne BambergerMariya Lamon Rotundo Disposition MHT

## 2014-05-14 NOTE — ED Notes (Signed)
Pt refused vitals.  [name], MHT

## 2014-05-15 DIAGNOSIS — F1910 Other psychoactive substance abuse, uncomplicated: Secondary | ICD-10-CM

## 2014-05-15 DIAGNOSIS — F319 Bipolar disorder, unspecified: Secondary | ICD-10-CM

## 2014-05-15 MED ORDER — DIPHENHYDRAMINE HCL 50 MG/ML IJ SOLN
25.0000 mg | Freq: Once | INTRAMUSCULAR | Status: AC
  Administered 2014-05-15: 25 mg via INTRAMUSCULAR

## 2014-05-15 MED ORDER — HALOPERIDOL LACTATE 5 MG/ML IJ SOLN: 5.0000 mg | Freq: Once | INTRAMUSCULAR | Status: DC

## 2014-05-15 MED ORDER — DIPHENHYDRAMINE HCL 50 MG/ML IJ SOLN
25.0000 mg | Freq: Once | INTRAMUSCULAR | Status: DC
  Filled 2014-05-15: qty 1

## 2014-05-15 MED ORDER — DIVALPROEX SODIUM 125 MG PO DR TAB: 625.0000 mg | DELAYED_RELEASE_TABLET | Freq: Two times a day (BID) | ORAL | Status: DC

## 2014-05-15 MED ORDER — CLOTRIMAZOLE 1 % EX CREA: 1.0000 "application " | TOPICAL_CREAM | Freq: Two times a day (BID) | CUTANEOUS | Status: DC

## 2014-05-15 MED ORDER — BENZTROPINE MESYLATE 1 MG PO TABS: 1.0000 mg | ORAL_TABLET | Freq: Every day | ORAL | Status: DC

## 2014-05-15 MED ORDER — DIPHENHYDRAMINE HCL 50 MG/ML IJ SOLN: 25.0000 mg | Freq: Once | INTRAMUSCULAR | Status: DC

## 2014-05-15 MED ORDER — LORAZEPAM 2 MG/ML IJ SOLN
2.0000 mg | Freq: Once | INTRAMUSCULAR | Status: DC
  Filled 2014-05-15: qty 1

## 2014-05-15 MED ORDER — LORAZEPAM 2 MG/ML IJ SOLN
2.0000 mg | Freq: Once | INTRAMUSCULAR | Status: AC
  Administered 2014-05-15: 2 mg via INTRAMUSCULAR

## 2014-05-15 MED ORDER — OLANZAPINE 10 MG PO TABS: 10.0000 mg | ORAL_TABLET | Freq: Two times a day (BID) | ORAL | Status: DC

## 2014-05-15 NOTE — Clinical Social Work Note (Signed)
[facility]- she remains on their waiting list and they will call as bed is available.  Reece LevyJanet Ohana Birdwell, MSW, Theresia MajorsLCSWA [phone]

## 2014-05-15 NOTE — ED Notes (Signed)
Pt continues to pace in hallway, but is no longer yelling loudly. Pt appearing drowsy at this time. No s/s of distress noted.

## 2014-05-15 NOTE — ED Notes (Signed)
Report received from [facility] RN. Pt. Alert and pacing in no distress denies SI, HI, AVH and pain. Will continue to monitor for safety. Pt. Instructed to come to me with problems or concerns. Q 15 minute checks continue.

## 2014-05-15 NOTE — [facility] Suicide Risk Assessment (Signed)
Suicide Risk Assessment  Discharge Assessment     Demographic Factors:  Caucasian  Total Time spent with patient: 20 minutes  Psychiatric Specialty Exam:     Blood pressure 111/70, pulse 93, temperature 97.3 F (36.3 C), temperature source Oral, resp. rate 20, last menstrual period 05/05/2014, SpO2 100.00%.There is no weight on file to calculate BMI.  General Appearance: Casual  Eye Contact::  Good  Speech:  Normal Rate  Volume:  Normal  Mood:  Irritable  Affect:  Congruent  Thought Process:  Coherent  Orientation:  Full (Time, Place, and Person)  Thought Content:  WDL  Suicidal Thoughts:  No  Homicidal Thoughts:  No  Memory:  Immediate;   Good Recent;   Good Remote;   Fair  Judgement:  Fair  Insight:  Fair  Psychomotor Activity:  Normal  Concentration:  Good  Recall:  [employer]  Fund of Knowledge:Fair  Language: Good  Akathisia:  No  Handed:  Right  AIMS (if indicated):     Assets:  Leisure Time Physical Health Resilience Social Support  Sleep:      Musculoskeletal: Strength & Muscle Tone: within normal limits Gait & Station: normal Patient leans: N/A  Mental Status Per Nursing Assessment::   On Admission:   Psychotic  Current Mental Status by Physician: NA  Loss Factors: NA  Historical Factors: NA  Risk Reduction Factors:   Sense of responsibility to family, Positive social support and Positive therapeutic relationship  Continued Clinical Symptoms:  Irritablity  Cognitive Features That Contribute To Risk:  None  Suicide Risk:  Minimal: No identifiable suicidal ideation.  Patients presenting with no risk factors but with morbid ruminations; may be classified as minimal risk based on the severity of the depressive symptoms  Discharge Diagnoses:   AXIS I:  Bipolar disorder, substance abuse AXIS II:  Deferred AXIS III:   Past Medical History  Diagnosis Date  . Asthma   . Anxiety   . Panic attacks   . Schizophrenia   . Bipolar disorder     AXIS IV:  other psychosocial or environmental problems, problems related to social environment and problems with primary support group AXIS V:  61-70 mild symptoms  Plan Of Care/Follow-up recommendations:  Activity:  as tolerated Diet:  low-sodium heart healthy diet  Is patient on multiple antipsychotic therapies at discharge:  No   Has Patient had three or more failed trials of antipsychotic monotherapy by history:  No  Recommended Plan for Multiple Antipsychotic Therapies: NA    Terrick Allred, PMH-NP 05/15/2014, 11:13 AM

## 2014-05-15 NOTE — Clinical Social Work Note (Signed)
Patient discussed with team- plan now is for dc home via ACTT team. [facility] contacted to advise and remove [facility] from [facility].  Reece LevyJanet Nealy Karapetian, MSW, Theresia MajorsLCSWA [phone]

## 2014-05-15 NOTE — ED Notes (Signed)
Patient awake, out in hallway ripping pages of magazine out and repeatedly flushing in toilet. Pt behaviors redirected; pt began yelling at staff and cursing. Pt stopping up toilet and causing it to overflow. Pt then throwing pieces of styrofoam cups into floor of her room and hallway. Pt unable to be redirected. Pt yelling and counting out loud, waking up other patients on unit.

## 2014-05-15 NOTE — ED Notes (Signed)
Pt continues behaviors and is pacing up and down hallway. Cori, NP notified; new orders received.

## 2014-05-15 NOTE — ED Notes (Signed)
Pt continues to pace in hallways, but is not yelling at this time. No s/s of distress noted. Will continue to monitor behaviors.
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
## 2014-05-15 NOTE — ED Notes (Signed)
Patient asleep at this time. Respirations regular and unlabored. No s/s of distress noted currently.

## 2014-05-17 NOTE — Consult Note (Signed)
Patient seen, evaluated by me, treatment plan formulated by me. Discussed the need to have patient's ACT team come and pick up the patient for discharge

## 2014-05-18 ENCOUNTER — Encounter (HOSPITAL_COMMUNITY): Payer: Self-pay | Admitting: Emergency Medicine

## 2014-05-18 DIAGNOSIS — Z5900 Homelessness unspecified: Secondary | ICD-10-CM | POA: Insufficient documentation

## 2014-05-18 DIAGNOSIS — Z79899 Other long term (current) drug therapy: Secondary | ICD-10-CM | POA: Insufficient documentation

## 2014-05-18 DIAGNOSIS — K5900 Constipation, unspecified: Secondary | ICD-10-CM | POA: Insufficient documentation

## 2014-05-18 DIAGNOSIS — T733XXA Exhaustion due to excessive exertion, initial encounter: Secondary | ICD-10-CM | POA: Insufficient documentation

## 2014-05-18 DIAGNOSIS — F17200 Nicotine dependence, unspecified, uncomplicated: Secondary | ICD-10-CM | POA: Insufficient documentation

## 2014-05-18 DIAGNOSIS — F319 Bipolar disorder, unspecified: Secondary | ICD-10-CM | POA: Insufficient documentation

## 2014-05-18 DIAGNOSIS — Y929 Unspecified place or not applicable: Secondary | ICD-10-CM | POA: Insufficient documentation

## 2014-05-18 DIAGNOSIS — F410 Panic disorder [episodic paroxysmal anxiety] without agoraphobia: Secondary | ICD-10-CM | POA: Insufficient documentation

## 2014-05-18 DIAGNOSIS — IMO0002 Reserved for concepts with insufficient information to code with codable children: Secondary | ICD-10-CM | POA: Insufficient documentation

## 2014-05-18 DIAGNOSIS — Y9301 Activity, walking, marching and hiking: Secondary | ICD-10-CM | POA: Insufficient documentation

## 2014-05-18 DIAGNOSIS — F209 Schizophrenia, unspecified: Secondary | ICD-10-CM | POA: Insufficient documentation

## 2014-05-18 DIAGNOSIS — Z3202 Encounter for pregnancy test, result negative: Secondary | ICD-10-CM | POA: Insufficient documentation

## 2014-05-18 DIAGNOSIS — J45909 Unspecified asthma, uncomplicated: Secondary | ICD-10-CM | POA: Insufficient documentation

## 2014-05-18 NOTE — ED Notes (Signed)
Pt to ED via [facility] with c/o constipation for several days.  Pt also c/o bil feet pain.  Also st's she wants a preg test

## 2014-05-19 ENCOUNTER — Emergency Department (HOSPITAL_COMMUNITY)
Admission: EM | Admit: 2014-05-19 | Discharge: 2014-05-19 | Disposition: A | Discharge status: Home / Self Care | Payer: PRIVATE HEALTH INSURANCE | Attending: Emergency Medicine | Admitting: Emergency Medicine

## 2014-05-19 ENCOUNTER — Emergency Department (HOSPITAL_COMMUNITY): Payer: PRIVATE HEALTH INSURANCE

## 2014-05-19 DIAGNOSIS — K5900 Constipation, unspecified: Secondary | ICD-10-CM

## 2014-05-19 DIAGNOSIS — S90821A Blister (nonthermal), right foot, initial encounter: Secondary | ICD-10-CM

## 2014-05-19 DIAGNOSIS — S90822A Blister (nonthermal), left foot, initial encounter: Secondary | ICD-10-CM

## 2014-05-19 LAB — URINALYSIS, ROUTINE W REFLEX MICROSCOPIC
Bilirubin Urine: NEGATIVE
Glucose, UA: NEGATIVE mg/dL
Hgb urine dipstick: NEGATIVE
Ketones, ur: NEGATIVE mg/dL
Leukocytes, UA: NEGATIVE
NITRITE: NEGATIVE
Protein, ur: NEGATIVE mg/dL
SPECIFIC GRAVITY, URINE: 1.011 (ref 1.005–1.030)
UROBILINOGEN UA: 0.2 mg/dL (ref 0.0–1.0)
pH: 6.5 (ref 5.0–8.0)

## 2014-05-19 LAB — PREGNANCY, URINE: PREG TEST UR: NEGATIVE

## 2014-05-19 MED ORDER — GLYCERIN (ADULT) 2 G RE SUPP: 1.0000 | Freq: Once | RECTAL | Status: DC | PRN

## 2014-05-19 MED ORDER — POLYETHYLENE GLYCOL 3350 17 G PO PACK
17.0000 g | PACK | Freq: Every day | ORAL | Status: DC
  Administered 2014-05-19: 17 g via ORAL
  Filled 2014-05-19: qty 1

## 2014-05-19 MED ORDER — POLYETHYLENE GLYCOL 3350 17 G PO PACK: 17.0000 g | PACK | Freq: Every day | ORAL | Status: DC

## 2014-05-19 MED ORDER — GLYCERIN (LAXATIVE) 2.1 G RE SUPP
1.0000 | Freq: Once | RECTAL | Status: AC
  Administered 2014-05-19: 1 via RECTAL
  Filled 2014-05-19: qty 1

## 2014-05-19 NOTE — ED Notes (Signed)
Patient transported to X-ray 

## 2014-05-19 NOTE — ED Notes (Addendum)
Pt reports constipation for the past several days, pt has taken ex lax for constipation 30mins prior to being placed in room. Pt also requesting pregnancy test. Dr Norlene Campbelltter at bedside.

## 2014-05-19 NOTE — Discharge Instructions (Signed)
Increase your fluid and fiber intake.  Take medications as prescribed.  Keep your feet dry when possible, apply moleskin or other protective barrier to help with blister pain.   Blisters Blisters are fluid-filled sacs that form within the skin. Common causes of blistering are friction, burns, and exposure to irritating chemicals. The fluid in the blister protects the underlying damaged skin. Most of the time it is not recommended that you open blisters. When a blister is opened, there is an increased chance for infection. Usually, a blister will open on its own. They then dry up and peel off within 10 days. If the blister is tense and uncomfortable (painful) the fluid may be drained. If it is drained the roof of the blister should be left intact. The draining should only be done by a medical professional under aseptic conditions. Poorly fitting shoes and boots can cause blisters by being too tight or too loose. Wearing extra socks or using tape, bandages, or pads over the blister-prone area helps prevent the problem by reducing friction. Blisters heal more slowly if you have diabetes or if you have problems with your circulation. You need to be careful about medical follow-up to prevent infection. HOME CARE INSTRUCTIONS  Protect areas where blisters have formed until the skin is healed. Use a special bandage with a hole cut in the middle around the blister. This reduces pressure and friction. When the blister breaks, trim off the loose skin and keep the area clean by washing it with soap daily. Soaking the blister or broken-open blister with diluted vinegar twice daily for 15 minutes will dry it up and speed the healing. Use 3 tablespoons of white vinegar per quart of water (45 mL white vinegar per liter of water). An antibiotic ointment and a bandage can be used to cover the area after soaking.  SEEK MEDICAL CARE IF:   You develop increased redness, pain, swelling, or drainage in the blistered  area.  You develop a pus-like discharge from the blistered area, chills, or a fever. MAKE SURE YOU:   Understand these instructions.  Will watch your condition.  Will get help right away if you are not doing well or get worse. Document Released: 10/30/2004 Document Revised: 12/15/2011 Document Reviewed: 09/27/2008 [facility] Patient Information 2015 [place]. This information is not intended to replace advice given to you by your health care provider. Make sure you discuss any questions you have with your health care provider.  Constipation Constipation is when a person has fewer than three bowel movements a week, has difficulty having a bowel movement, or has stools that are dry, hard, or larger than normal. As people grow older, constipation is more common. If you try to fix constipation with medicines that make you have a bowel movement (laxatives), the problem may get worse. Long-term laxative use may cause the muscles of the colon to become weak. A low-fiber diet, not taking in enough fluids, and taking certain medicines may make constipation worse.  CAUSES   Certain medicines, such as antidepressants, pain medicine, iron supplements, antacids, and water pills.   Certain diseases, such as diabetes, irritable bowel syndrome (IBS), thyroid disease, or depression.   Not drinking enough water.   Not eating enough fiber-rich foods.   Stress or travel.   Lack of physical activity or exercise.   Ignoring the urge to have a bowel movement.   Using laxatives too much.  SIGNS AND SYMPTOMS   Having fewer than three bowel movements a week.  Straining to have a bowel movement.   Having stools that are hard, dry, or larger than normal.   Feeling full or bloated.   Pain in the lower abdomen.   Not feeling relief after having a bowel movement.  DIAGNOSIS  Your health care provider will take a medical history and perform a physical exam. Further testing may be  done for severe constipation. Some tests may include:  A barium enema X-ray to examine your rectum, colon, and, sometimes, your small intestine.   A sigmoidoscopy to examine your lower colon.   A colonoscopy to examine your entire colon. TREATMENT  Treatment will depend on the severity of your constipation and what is causing it. Some dietary treatments include drinking more fluids and eating more fiber-rich foods. Lifestyle treatments may include regular exercise. If these diet and lifestyle recommendations do not help, your health care provider may recommend taking over-the-counter laxative medicines to help you have bowel movements. Prescription medicines may be prescribed if over-the-counter medicines do not work.  HOME CARE INSTRUCTIONS   Eat foods that have a lot of fiber, such as fruits, vegetables, whole grains, and beans.  Limit foods high in fat and processed sugars, such as french fries, hamburgers, cookies, candies, and soda.   A fiber supplement may be added to your diet if you cannot get enough fiber from foods.   Drink enough fluids to keep your urine clear or pale yellow.   Exercise regularly or as directed by your health care provider.   Go to the restroom when you have the urge to go. Do not hold it.   Only take over-the-counter or prescription medicines as directed by your health care provider. Do not take other medicines for constipation without talking to your health care provider first.  SEEK IMMEDIATE MEDICAL CARE IF:   You have bright red blood in your stool.   Your constipation lasts for more than 4 days or gets worse.   You have abdominal or rectal pain.   You have thin, pencil-like stools.   You have unexplained weight loss. MAKE SURE YOU:   Understand these instructions.  Will watch your condition.  Will get help right away if you are not doing well or get worse. Document Released: 06/20/2004 Document Revised: 09/27/2013 Document  Reviewed: 07/04/2013 [facility] Patient Information 2015 [place], [place]. This information is not intended to replace advice given to you by your health care provider. Make sure you discuss any questions you have with your health care provider.

## 2014-05-19 NOTE — ED Provider Notes (Signed)
CSN: [id number]     Arrival date & time 05/18/14  2256 History   First MD Initiated Contact with Patient 05/19/14 [phone]     Chief Complaint  Patient presents with  . Constipation     (Consider location/radiation/quality/duration/timing/severity/associated sxs/prior Treatment) HPI 28 year old female presents to emergency department with multiple complaints.  Patient reports that she has bilateral feet pain and swelling, constipation, and is concerned she may be pregnant.  Patient has history of asthma, schizophrenia, and was recently seen in the ED for prolonged stay for treatment of schizophrenia.  At that time she apparently the test done that was negative.  Patient reports that she last had her period at the end of June beginning of July, and has not yet had a period for August.  Patient reports that she walks most of the time and she is homeless and she has had increasing pain in her feet.  She denies any drainage, no redness.  She is concerned she may have a fungal infection.  Patient reports that she is taking ex-lax, but has been unable to have a bowel movement.  She reports that she resorted to taking out hard stool today which made her feel little bit better.  She denies any vaginal or urinary complaints at this time.  Patient reports that she has run out of the Lotrimin prescribed to her in the past. Past Medical History  Diagnosis Date  . Asthma   . Anxiety   . Panic attacks   . Schizophrenia   . Bipolar disorder    History reviewed. No pertinent past surgical history. No family history on file. History  Substance Use Topics  . Smoking status: Current Every Day Smoker  . Smokeless tobacco: Not on file  . Alcohol Use: Yes   OB History   Grav Para Term Preterm Abortions TAB SAB Ect Mult Living                 Review of Systems  Review of systems is limited secondary to psychiatric disease  Allergies  Review of patient's allergies indicates no known allergies.  Home  Medications   Prior to Admission medications   Medication Sig Start Date End Date Taking? Authorizing Provider  benztropine (COGENTIN) 1 MG tablet Take 1 mg by mouth at bedtime.   Yes Historical Provider, MD  clotrimazole (LOTRIMIN) 1 % cream Apply 1 application topically 2 (two) times daily. Apply to feet   Yes Historical Provider, MD  divalproex (DEPAKOTE) 125 MG DR tablet Take 625 mg by mouth 2 (two) times daily.   Yes Historical Provider, MD  ibuprofen (ADVIL,MOTRIN) 200 MG tablet Take 400-600 mg by mouth every 6 (six) hours as needed for mild pain or moderate pain.   Yes Historical Provider, MD  OLANZapine (ZYPREXA) 10 MG tablet Take 10 mg by mouth 2 (two) times daily.   Yes Historical Provider, MD  Sennosides (EX-LAX PO) Take 1 tablet by mouth daily as needed (constipation).   Yes Historical Provider, MD  GLYCERIN ADULT 2 G suppository Place 1 suppository rectally once as needed for moderate constipation. 05/19/14   Olivia Mackielga M Jais Demir, MD  polyethylene glycol [facility](MIRALAX / Ethelene HalGLYCOLAX) packet Take 17 g by mouth daily. 05/19/14   Olivia Mackielga M Deforest Maiden, MD   BP 134/85  Pulse 87  Temp(Src) 97.7 F (36.5 C) (Oral)  Resp 18  Ht 5\' 9"  (1.753 m)  Wt 190 lb (86.183 kg)  BMI 28.05 kg/m2  SpO2 99%  LMP 04/05/2014 Physical Exam  Nursing  note and vitals reviewed. Constitutional: She is oriented to person, place, and time. She appears well-developed and well-nourished. No distress.  HENT:  Head: Normocephalic and atraumatic.  Right Ear: External ear normal.  Left Ear: External ear normal.  Nose: Nose normal.  Mouth/Throat: Oropharynx is clear and moist.  Eyes: Conjunctivae and EOM are normal. Pupils are equal, round, and reactive to light.  Neck: Normal range of motion. Neck supple. No JVD present. No tracheal deviation present. No thyromegaly present.  Cardiovascular: Normal rate, regular rhythm, normal heart sounds and intact distal pulses.  Exam reveals no gallop and no friction rub.   No murmur  heard. Pulmonary/Chest: Effort normal and breath sounds normal. No stridor. No respiratory distress. She has no wheezes. She has no rales. She exhibits no tenderness.  Abdominal: Soft. Bowel sounds are normal. She exhibits no distension and no mass. There is tenderness (very mild tenderness to palpation across lower abdomen). There is no rebound and no guarding.  Musculoskeletal: Normal range of motion. She exhibits tenderness (tenderness to bilateral plantar surfaces). She exhibits no edema.  The patient has multiple blisters across her feet, some have ruptured.  There is no erythema, drainage or signs of infection  Lymphadenopathy:    She has no cervical adenopathy.  Neurological: She is alert and oriented to person, place, and time. She exhibits normal muscle tone. Coordination normal.  Skin: Skin is warm and dry. No rash noted. No erythema. No pallor.  Psychiatric: Her behavior is normal. Judgment and thought content normal.  Poor insight and judgment, odd affect    ED Course  Procedures (including critical care time) Labs Review Labs Reviewed  URINALYSIS, ROUTINE W REFLEX MICROSCOPIC  PREGNANCY, URINE    Imaging Review Dg Abd 1 View  05/19/2014   CLINICAL DATA:  CONSTIPATION  EXAM: ABDOMEN - 1 VIEW  COMPARISON:  None.  FINDINGS: Bowel gas pattern is within normal limits without evidence of obstruction or ileus. Moderate amount retained stool within the colon. No abnormal bowel wall thickening. No soft tissue mass or abnormal calcification. No acute osseus abnormality.  IMPRESSION: Nonobstructive bowel gas pattern. Moderate amount retained stool within the colon.   Electronically Signed   By: Rise Mu M.D.   On: 05/19/2014 05:07     EKG Interpretation None      MDM   Final diagnoses:  Constipation, unspecified constipation type  Friction blister of the foot, left, initial encounter  Friction blister of the foot, right, initial encounter   28 year old female with  multiple complaints.  No signs of urinary tract infection or pregnancy.  No obstruction seen on x-ray.  Plan for treatment with glycerin suppository and MiraLAX.   Olivia Mackie, MD 05/19/14 [phone]

## 2014-05-19 NOTE — ED Notes (Signed)
Pt also has blisters to bottom of feet.

## 2014-06-15 ENCOUNTER — Encounter (HOSPITAL_COMMUNITY): Payer: Self-pay | Admitting: Emergency Medicine

## 2014-06-15 ENCOUNTER — Emergency Department (HOSPITAL_COMMUNITY): Payer: PRIVATE HEALTH INSURANCE

## 2014-06-15 ENCOUNTER — Emergency Department (HOSPITAL_COMMUNITY)
Admission: EM | Admit: 2014-06-15 | Discharge: 2014-06-16 | Disposition: A | Discharge status: Home / Self Care | Payer: PRIVATE HEALTH INSURANCE | Attending: Emergency Medicine | Admitting: Emergency Medicine

## 2014-06-15 DIAGNOSIS — S0100XA Unspecified open wound of scalp, initial encounter: Secondary | ICD-10-CM | POA: Insufficient documentation

## 2014-06-15 DIAGNOSIS — Z3202 Encounter for pregnancy test, result negative: Secondary | ICD-10-CM | POA: Insufficient documentation

## 2014-06-15 DIAGNOSIS — Z79899 Other long term (current) drug therapy: Secondary | ICD-10-CM | POA: Insufficient documentation

## 2014-06-15 DIAGNOSIS — S2249XA Multiple fractures of ribs, unspecified side, initial encounter for closed fracture: Secondary | ICD-10-CM | POA: Insufficient documentation

## 2014-06-15 DIAGNOSIS — S0990XA Unspecified injury of head, initial encounter: Secondary | ICD-10-CM | POA: Insufficient documentation

## 2014-06-15 DIAGNOSIS — S2232XA Fracture of one rib, left side, initial encounter for closed fracture: Secondary | ICD-10-CM

## 2014-06-15 DIAGNOSIS — F10929 Alcohol use, unspecified with intoxication, unspecified: Secondary | ICD-10-CM

## 2014-06-15 DIAGNOSIS — F1010 Alcohol abuse, uncomplicated: Secondary | ICD-10-CM | POA: Insufficient documentation

## 2014-06-15 DIAGNOSIS — S0101XA Laceration without foreign body of scalp, initial encounter: Secondary | ICD-10-CM

## 2014-06-15 DIAGNOSIS — J45909 Unspecified asthma, uncomplicated: Secondary | ICD-10-CM | POA: Insufficient documentation

## 2014-06-15 DIAGNOSIS — F209 Schizophrenia, unspecified: Secondary | ICD-10-CM | POA: Insufficient documentation

## 2014-06-15 DIAGNOSIS — F319 Bipolar disorder, unspecified: Secondary | ICD-10-CM | POA: Insufficient documentation

## 2014-06-15 DIAGNOSIS — F17200 Nicotine dependence, unspecified, uncomplicated: Secondary | ICD-10-CM | POA: Insufficient documentation

## 2014-06-15 LAB — COMPREHENSIVE METABOLIC PANEL
ALT: 16 U/L (ref 0–35)
AST: 24 U/L (ref 0–37)
Albumin: 3.9 g/dL (ref 3.5–5.2)
Alkaline Phosphatase: 137 U/L — ABNORMAL HIGH (ref 39–117)
Anion gap: 16 — ABNORMAL HIGH (ref 5–15)
BUN: 7 mg/dL (ref 6–23)
CO2: 23 meq/L (ref 19–32)
CREATININE: 0.49 mg/dL — AB (ref 0.50–1.10)
Calcium: 8.8 mg/dL (ref 8.4–10.5)
Chloride: 104 mEq/L (ref 96–112)
Glucose, Bld: 104 mg/dL — ABNORMAL HIGH (ref 70–99)
Potassium: 4.2 mEq/L (ref 3.7–5.3)
Sodium: 143 mEq/L (ref 137–147)
Total Bilirubin: <0.2 mg/dL — ABNORMAL LOW (ref 0.3–1.2)
Total Protein: 7.9 g/dL (ref 6.0–8.3)

## 2014-06-15 LAB — PROTIME-INR
INR: 0.9 (ref 0.00–1.49)
Prothrombin Time: 12.2 seconds (ref 11.6–15.2)

## 2014-06-15 LAB — SAMPLE TO BLOOD BANK

## 2014-06-15 LAB — RAPID HIV SCREEN (WH-MAU): Rapid HIV Screen: NONREACTIVE

## 2014-06-15 LAB — ETHANOL: ALCOHOL ETHYL (B): 327 mg/dL — AB (ref 0–11)

## 2014-06-15 LAB — POC URINE PREG, ED: Preg Test, Ur: NEGATIVE

## 2014-06-15 MED ORDER — SODIUM CHLORIDE 0.9 % IV SOLN
1000.0000 mL | Freq: Once | INTRAVENOUS | Status: AC
  Administered 2014-06-15: 1000 mL via INTRAVENOUS

## 2014-06-15 MED ORDER — SODIUM CHLORIDE 0.9 % IV SOLN
1000.0000 mL | INTRAVENOUS | Status: DC
  Administered 2014-06-15: 1000 mL via INTRAVENOUS

## 2014-06-15 MED ORDER — LIDOCAINE-EPINEPHRINE (PF) 2 %-1:200000 IJ SOLN
INTRAMUSCULAR | Status: AC
  Filled 2014-06-15: qty 20

## 2014-06-15 MED ORDER — TETANUS-DIPHTHERIA TOXOIDS TD 5-2 LFU IM INJ
0.5000 mL | INJECTION | Freq: Once | INTRAMUSCULAR | Status: AC
  Administered 2014-06-15: 0.5 mL via INTRAMUSCULAR
  Filled 2014-06-15: qty 0.5

## 2014-06-15 MED ORDER — LIDOCAINE-EPINEPHRINE (PF) 2 %-1:200000 IJ SOLN: 10.0000 mL | Freq: Once | INTRAMUSCULAR | Status: DC

## 2014-06-15 MED ORDER — LORAZEPAM 2 MG/ML IJ SOLN
2.0000 mg | Freq: Once | INTRAMUSCULAR | Status: DC
  Filled 2014-06-15: qty 1

## 2014-06-15 MED ORDER — HALOPERIDOL LACTATE 5 MG/ML IJ SOLN
5.0000 mg | Freq: Once | INTRAMUSCULAR | Status: AC
  Administered 2014-06-15: 5 mg via INTRAMUSCULAR
  Filled 2014-06-15: qty 1
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
## 2014-06-15 NOTE — ED Notes (Signed)
Portable xray at bedside.
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
## 2014-06-15 NOTE — ED Notes (Signed)
MD Ward inserting staples to head lac

## 2014-06-15 NOTE — ED Notes (Signed)
5cm lac to left parietal region of head, arterial bleeding stopped with silver nitrate and 4 staples inserted by Dr. Elesa Massed. 2 cm lac to left parietal region of head with 1 staple inserted to stop bleeding by Dr. Elesa Massed.

## 2014-06-16 LAB — CBC WITH DIFFERENTIAL/PLATELET
Basophils Absolute: 0 10*3/uL (ref 0.0–0.1)
Basophils Relative: 0 % (ref 0–1)
Eosinophils Absolute: 0.1 10*3/uL (ref 0.0–0.7)
Eosinophils Relative: 2 % (ref 0–5)
HCT: 37.9 % (ref 36.0–46.0)
HEMOGLOBIN: 13 g/dL (ref 12.0–15.0)
LYMPHS ABS: 2.2 10*3/uL (ref 0.7–4.0)
LYMPHS PCT: 28 % (ref 12–46)
MCH: 30.6 pg (ref 26.0–34.0)
MCHC: 34.3 g/dL (ref 30.0–36.0)
MCV: 89.2 fL (ref 78.0–100.0)
MONOS PCT: 6 % (ref 3–12)
Monocytes Absolute: 0.5 10*3/uL (ref 0.1–1.0)
NEUTROS ABS: 5.2 10*3/uL (ref 1.7–7.7)
NEUTROS PCT: 64 % (ref 43–77)
Platelets: 229 10*3/uL (ref 150–400)
RBC: 4.25 MIL/uL (ref 3.87–5.11)
RDW: 13.1 % (ref 11.5–15.5)
WBC: 8.1 10*3/uL (ref 4.0–10.5)

## 2014-06-16 LAB — URINALYSIS, ROUTINE W REFLEX MICROSCOPIC
BILIRUBIN URINE: NEGATIVE
GLUCOSE, UA: NEGATIVE mg/dL
Hgb urine dipstick: NEGATIVE
Ketones, ur: NEGATIVE mg/dL
Nitrite: NEGATIVE
PH: 6 (ref 5.0–8.0)
Protein, ur: NEGATIVE mg/dL
Specific Gravity, Urine: 1.007 (ref 1.005–1.030)
Urobilinogen, UA: 0.2 mg/dL (ref 0.0–1.0)

## 2014-06-16 LAB — URINE MICROSCOPIC-ADD ON

## 2014-06-16 LAB — HEPATITIS PANEL, ACUTE
HCV AB: NEGATIVE
HEP B S AG: NEGATIVE
Hep A IgM: NONREACTIVE
Hep B C IgM: NONREACTIVE

## 2014-06-16 LAB — CBG MONITORING, ED: Glucose-Capillary: 99 mg/dL (ref 70–99)

## 2014-06-16 MED ORDER — LORAZEPAM 2 MG/ML IJ SOLN
1.0000 mg | Freq: Once | INTRAMUSCULAR | Status: AC
  Administered 2014-06-16: 1 mg via INTRAVENOUS

## 2014-06-16 NOTE — ED Notes (Signed)
C-Collar removed, IV removed, pt. Taken of monitor. Pt. Changed into blue scrubs. Old dried blood washed off Pt's face, neck and chest. Pt. Stated she did not have a way home and she was very sleep. Dr. Patria Mane made aware. Stated she could sleep for a few hours then call someone to come get her.

## 2014-06-16 NOTE — ED Notes (Signed)
Per lab, CBC clotted x 2; will recollect

## 2014-06-16 NOTE — ED Notes (Addendum)
Came to room. Pt. Was noted to be standing up beside stretcher demanding that her neck brace be removed. I explained to her that I would have to speak to the MD about whether it could be removed. Pt. Became verbally abusive demanding it be removed. She became increasingly mad and then she swung the monitor cables around room like a whip barely missing this RN. I continued to remind Pt. That her neck had to be cleared by the MD. Call placed to Charge RN who spoke with MD. Stated it was ok to remove everything and d/c pt. If she was adamant about going outside and smoke cigarette.

## 2014-06-16 NOTE — ED Notes (Signed)
Given coke and [place] sandwich.

## 2014-06-16 NOTE — ED Notes (Addendum)
On assessment, pt resting with snoring respirations; pt easily arousable. Pt stating "I need to go to the restroom!" Pt placed on bedpan; pt attempting to climb out of bed. Foley cath placed.
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
## 2014-06-16 NOTE — ED Notes (Signed)
Pt very combative, attempting to climb out of bed. Pt instructed to stay in bed. Phlebotomy at bedside attempting to draw blood, pt screaming, "Get off of me!" and swinging and hitting at this RN. Dr. Patria Mane called to come to bedside and Dr. Patria Mane at bedside and attempting to assess patient, pt yelling at Dr. Patria Mane and swinging at him.  IV ativan verbal order.

## 2014-06-16 NOTE — ED Notes (Signed)
PATIENT IS AWAKE AND AMBULATORY WITH STEADY GAIT. SHE IS VERBALLY AGGRESSIVE WITH STAFF WANTING THEM TO STOP WHAT THEY ARE DOING AND GIVE HER HER "@#%^&^" BELONGINGS NOW SO SHE CAN GO "#%$^&*%^" SMOKE A CIGARETTE.  ADVISED PT THAT I WILL GET HER DISCHARGE INSTRUCTIONS AND TRY TO LOCATE HER BELONGINGS.

## 2014-06-16 NOTE — ED Provider Notes (Signed)
7:42 AM The patient is sober clinically in emergency apartment this time.  She will be discharged home in good condition.  I've instructed the patient to followup to have her staples removed in 10 days.  She is ambulatory in the emergency department.  She is complaining of no significant pain at this time.  There is question of small left-sided or fractures however the patient has no chest pain and no tenderness in the left side of her chest.  Have asked that she take some anti-inflammatories at home for discomfort.  She understands return to ER for new or worsening symptoms  Lyanne Co, MD 06/16/14 [phone]

## 2014-06-25 ENCOUNTER — Encounter (HOSPITAL_COMMUNITY): Payer: Self-pay | Admitting: Emergency Medicine

## 2014-06-25 ENCOUNTER — Emergency Department (HOSPITAL_COMMUNITY)
Admission: EM | Admit: 2014-06-25 | Discharge: 2014-06-25 | Disposition: A | Discharge status: Home / Self Care | Payer: PRIVATE HEALTH INSURANCE | Attending: Emergency Medicine | Admitting: Emergency Medicine

## 2014-06-25 DIAGNOSIS — F1010 Alcohol abuse, uncomplicated: Secondary | ICD-10-CM | POA: Insufficient documentation

## 2014-06-25 DIAGNOSIS — Z79899 Other long term (current) drug therapy: Secondary | ICD-10-CM | POA: Insufficient documentation

## 2014-06-25 DIAGNOSIS — F319 Bipolar disorder, unspecified: Secondary | ICD-10-CM | POA: Insufficient documentation

## 2014-06-25 DIAGNOSIS — Z4802 Encounter for removal of sutures: Secondary | ICD-10-CM | POA: Insufficient documentation

## 2014-06-25 DIAGNOSIS — F17200 Nicotine dependence, unspecified, uncomplicated: Secondary | ICD-10-CM | POA: Insufficient documentation

## 2014-06-25 DIAGNOSIS — J45909 Unspecified asthma, uncomplicated: Secondary | ICD-10-CM | POA: Insufficient documentation

## 2014-06-25 DIAGNOSIS — F411 Generalized anxiety disorder: Secondary | ICD-10-CM | POA: Insufficient documentation

## 2014-06-25 DIAGNOSIS — F209 Schizophrenia, unspecified: Secondary | ICD-10-CM | POA: Insufficient documentation

## 2014-06-25 NOTE — ED Notes (Signed)
PT requested a bus pass and a cup of coffee . Pt provided with coffee and bus pass.
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
## 2014-06-25 NOTE — Discharge Instructions (Signed)
Please call your doctor for a followup appointment within 24-48 hours. When you talk to your doctor please let them know that you were seen in the emergency department and have them acquire all of your records so that they can discuss the findings with you and formulate a treatment plan to fully care for your new and ongoing problems. Please keep site clean Please avoid scratching the sites Please continue to monitor symptoms closely and if symptoms are to worsen or change (fever greater than 101, chills, sweating, nausea, vomiting, chest pain, shortness of breathe, difficulty breathing, weakness, numbness, tingling, worsening or changes to pain pattern, fall, injury, headache, dizziness, blurred vision, neck pain, neck stiffness, drainage or bleeding from the site) please report back to the Emergency Department immediately.     Staple Removal, Care After The staples that were used to close your skin have been removed. The care described here will need to continue until the wound is completely healed and your health care provider confirms that wound care can be stopped. HOME CARE INSTRUCTIONS   Keep the wound site dry and clean. Do not soak it in water.  If skin adhesive strips were applied after the staples were removed, they will begin to peel off in a few days. Allow them to remain in place until they fall off on their own.  If you still have a bandage (dressing), change it at least once a day or as directed by your health care provider. If the dressing sticks, pour warm, sterile water over it until it loosens and can be removed without pulling apart the wound edges. Pat dry with a clean towel.  Apply cream or ointment that stops the growth of bacteria (antibacterial cream or ointment) only if your health care provider has directed you to do so. Place a nonstick bandage over the wound to prevent the dressing from sticking.  Cover the nonstick bandage with a new dressing as directed by your  health care provider.  If the bandage becomes wet, dirty, or develops a bad smell, change it as soon as possible.  New scars become sunburned easily. Use sunscreens with a sun protection factor (SPF) of at least 15 when out in the sun. Reapply the SPF every 2 hours.  Only take medicines as directed by your health care provider. SEEK IMMEDIATE MEDICAL CARE IF:   You have redness, swelling, or increasing pain in the wound.  You have pus coming from the wound.  You have a fever.  You notice a bad smell coming from the wound or dressing.  Your wound edges open up after staples have been removed. MAKE SURE YOU:   Understand these instructions.  Will watch your condition.  Will get help right away if you are not doing well or get worse. Document Released: 09/04/2008 Document Revised: 09/27/2013 Document Reviewed: 09/04/2008 [facility] Patient Information 2015 [place], [place]. This information is not intended to replace advice given to you by your health care provider. Make sure you discuss any questions you have with your health care provider.   Emergency Department Resource Guide 1) Find a Doctor and Pay Out of Pocket Although you won't have to find out who is covered by your insurance plan, it is a good idea to ask around and get recommendations. You will then need to call the office and see if the doctor you have chosen will accept you as a new patient and what types of options they offer for patients who are self-pay. Some doctors offer  discounts or will set up payment plans for their patients who do not have insurance, but you will need to ask so you aren't surprised when you get to your appointment.  2) Contact Your Local Health Department Not all health departments have doctors that can see patients for sick visits, but many do, so it is worth a call to see if yours does. If you don't know where your local health department is, you can check in your phone book. The CDC also has a  tool to help you locate your state's health department, and many state websites also have listings of all of their local health departments.  3) Find a [facility] If your illness is not likely to be very severe or complicated, you may want to try a walk in clinic. These are popping up all over the country in pharmacies, drugstores, and shopping centers. They're usually staffed by nurse practitioners or physician assistants that have been trained to treat common illnesses and complaints. They're usually fairly quick and inexpensive. However, if you have serious medical issues or chronic medical problems, these are probably not your best option.  No Primary Care Doctor: - Call Health Connect at  [phone] - they can help you locate a primary care doctor that  accepts your insurance, provides certain services, etc. - Physician Referral Service- [phone]  Chronic Pain Problems: Organization         Address  Phone   Notes  [facility]  [phone] Patients need to be referred by their primary care doctor.   Medication Assistance: Organization         Address  Phone   Notes  [facility] Medication [facility] [address] [place], [place] [address] [phone] --Must be a resident of [facility] -- Must have NO insurance coverage whatsoever (no Medicaid/ Medicare, etc.) -- The pt. MUST have a primary care doctor that directs their care regularly and follows them in the community   [facility]  [phone]   [employer]  [phone]    Agencies that provide inexpensive medical care: Organization         Address  Phone   Notes  [place]  [phone]   [facility]    [phone]   [facility] [place], [place] [address] [phone]   [place] [address] [place] [phone]   Planned Parenthood    [phone]    [facility]    [phone]   [place] and [address], [place] Phone:  [phone], Fax:  [phone] Hours of Operation:  9 am - 6 pm, M-F.  Also accepts Medicaid/Medicare and self-pay.  [facility] for [place], [address], [place] Phone: [phone], Fax: [phone]. Hours of Operation:  8:30 am - 5:30 pm, M-F.  Also accepts Medicaid and self-pay.  [facility] [place] [address] [place] Phone: [phone]   [place], [place], [place] [phone], Ext. [phone] Mondays & Thursdays: 7-9 AM.  First 15 patients are seen on a first come, first serve basis.    [place] Providers:  Organization         Address  Phone   Notes  [facility] [address],  [place] [phone] Also accepts self-pay patients.  [facility] [address], [place]  [phone]   [place], [address], [place] [phone]   [facility] [address], [place] [phone]   Lucianne Lei [address], [place]   [phone] Only accepts [place] Access [place] patients after they have their name applied to their card.   Self-Pay (no insurance) in [facility]:  Organization         Address  Phone   Notes  Sickle Cell Patients, [facility] [place] [phone]   [facility] [phone]   [facility] [place]  [place], [place], [place] [phone]   [facility]/Dr. Osei-Bonsu  [address] [place] or [address], [place] [phone] Phone number for both [place] and [place] locations is the same.  [facility]gent [facility] and [facility] [address] [place] [phone]   [facility] [address], [place] or [address] [phone]   [facility] [address] [place] [phone], phone; [phone], fax Sees patients 1st and 3rd Saturday of every month.  Must not qualify for public or private insurance (i.e. Medicaid, Medicare, [place] Health Choice, Veterans' Benefits)  Household income should be no more than 200% of the poverty level The clinic cannot treat you if you are pregnant or think you are pregnant  Sexually transmitted diseases are not treated at the clinic.    Dental Care: Organization         Address  Phone  Notes  [facility] Department of [facility] [place] [phone] Accepts children up to age 22 who are enrolled in [place] or [place]; pregnant women with a Medicaid card; and children who have applied for Medicaid or [place] Health Choice, but were declined, whose parents can pay a reduced fee at time of service.  [facility] Department of [facility]  [address], [place] [phone] Accepts children up to age 76 who are enrolled in [place] or [place]; pregnant women with a Medicaid card; and children who have applied for Medicaid or [place] Health Choice, but were declined, whose parents can pay a reduced fee at time of service.  [place] Adult [facility] Access PROGRAM  [place] [phone] Patients are seen by appointment only. Walk-ins are not accepted. [place] will see patients 20 years of age and older. Monday - Tuesday (8am-5pm) Most Wednesdays (8:30-5pm) $30 per visit, cash only  [facility] Adult Dental Access PROGRAM  [address], [facility] [phone] Patients are seen by appointment only. Walk-ins are not accepted. [place] will see patients 76 years of age and older. One Wednesday  Evening (Monthly: Volunteer Based).  $30 per visit, cash only  [place]  [phone] for adults; Children under age 66, call Graduate Pediatric Dentistry at [phone]. Children aged 76-14, please call [phone] to request a pediatric application.  Dental services are provided in all areas of dental care including fillings, crowns and bridges, complete and partial dentures, implants, gum treatment, root canals, and extractions. Preventive care is  also provided. Treatment is provided to both adults and children. Patients are selected via a lottery and there is often a waiting list.   [facility] [address] [place]  [phone] [email or web address]   [facility] [address] [place], [place] [phone], Ext. [phone] Second and Fourth Thursday of each month, opens at 6:30 AM; Clinic ends at 9 AM.  Patients are seen on a first-come first-served basis, and a limited number are seen during each clinic.   [facility]  [address] Hillard Danker [place], [place] [phone]   Eligibility Requirements You must have lived in [place], [place], or [place] counties for at least the last three months.   You cannot be eligible for state or federal sponsored [employer], including [employer], [place], or [employer].   You generally cannot be eligible for healthcare insurance through your employer.    How to apply: Eligibility screenings are held every Tuesday and Wednesday afternoon from 1:00 pm until 4:00 pm. You do not need an appointment for the interview!  [facility] [address] [place], [place]   [place]  [place] Department  [place]  [phone]    Behavioral Health Resources in the Community: Intensive Outpatient Programs Organization         Address  Phone  Notes  [place]. [address], [place], [place] [phone]   [facility] Outpatient [address] [place], [place]   ADS: [facility] [address] [place], [place]   [place] [address]  [place], [place] or [phone]   Substance Abuse Resources Organization         Address  Phone  Notes  Alcohol and Drug Services  [phone]   [place]  [phone]   [facility] [place]   Chinita Pester  [phone]   Residential & Outpatient Substance Abuse Program  [phone]   Psychological Services Organization         Address  Phone  Notes  [facility] [place]  [place]  [phone]   [place] [address], [place] or [phone]    Mobile Crisis Teams Organization         Address  Phone  Notes  [facility], Mobile Crisis Care Unit  [phone]   [facility]  [address] [place], [place]   [place] [address], [place] [phone]    Self-Help/Support Groups Organization         Address  Phone             Notes  [place]. of [place] - variety of support groups  [place] Call for more information  Narcotics Anonymous (NA), [facility] [address], [employer] [place]  2 meetings at this location   Special educational needs teacher         Address  Phone  Notes  [facility]idential [facility] [place],    [place]  [phone]   [facility]  [address] [place] [id number], [place], [place]   [place], [place] [phone] Admissions: 8am-3pm M-F  Incentives Substance [place] [address]  [place], [place] [phone]   [facility] [address] [place]  [place], [place] [phone]   [facility] [address],  [place], [place] [phone]   [facility] - Intensive Outpatient [address] Laurell [address], [place], [place] [phone]   [facility] [address] [place].,  [place], [place] [phone] or [phone]   [facility] Services (RTS) [address], [place], [place] [phone] Accepts Medicaid  [facility] [place] [address]  [place] [phone] Substance Abuse/Addiction Treatment   [facility] Organization         Address  Phone  Notes  [facility]  [phone]   Angie Fava, PhD [address] Ervin Knack [place], [place]   [phone] or [phone]   [facility] Behavioral   [address] [place], [place] [phone]   [facility] [address] [place], [place] [phone] Insurance/Medicaid/sponsorship through [facility] and Families [address]                                    [place], [place] [phone] Therapy/tele-psych/case  [facility] [address], [place] [phone]    Dr. Lolly Mustache  [phone]   [facility] [place]  [employer] [facility] Dept. 1) [address] [place] 2) [address], [place] 3)  [address], [place] [phone]  [phone]   [facility] Child Abuse Hotline 202-540-0935 or [phone] (After Hours)

## 2014-06-25 NOTE — ED Notes (Signed)
PT requested a [place] sandwich ,cup of coffee and 2 cans of Coke . Pt  Was provided  With all the food as requested.

## 2014-06-25 NOTE — ED Notes (Signed)
Pt reports needing staples removed from right side of head, no complaints.

## 2014-06-25 NOTE — ED Notes (Signed)
Declined W/C at D/C and was escorted to lobby by RN. 

## 2014-06-27 NOTE — ED Provider Notes (Signed)
Medical screening examination/treatment/procedure(s) were performed by non-physician practitioner and as supervising physician I was immediately available for consultation/collaboration.   Brien Lowe L Rion Catala, MD 06/27/14 2148 

## 2014-10-19 ENCOUNTER — Encounter (HOSPITAL_COMMUNITY): Payer: Self-pay | Admitting: Orthopedic Surgery

## 2014-11-24 ENCOUNTER — Ambulatory Visit: Payer: Self-pay

## 2015-03-02 ENCOUNTER — Encounter (HOSPITAL_COMMUNITY): Payer: Self-pay

## 2015-03-02 ENCOUNTER — Emergency Department (HOSPITAL_COMMUNITY)
Admission: EM | Admit: 2015-03-02 | Discharge: 2015-03-03 | Payer: PRIVATE HEALTH INSURANCE | Attending: Emergency Medicine | Admitting: Emergency Medicine

## 2015-03-02 DIAGNOSIS — F410 Panic disorder [episodic paroxysmal anxiety] without agoraphobia: Secondary | ICD-10-CM | POA: Insufficient documentation

## 2015-03-02 DIAGNOSIS — W19XXXA Unspecified fall, initial encounter: Secondary | ICD-10-CM

## 2015-03-02 DIAGNOSIS — F1410 Cocaine abuse, uncomplicated: Secondary | ICD-10-CM | POA: Insufficient documentation

## 2015-03-02 DIAGNOSIS — Z23 Encounter for immunization: Secondary | ICD-10-CM | POA: Insufficient documentation

## 2015-03-02 DIAGNOSIS — Y998 Other external cause status: Secondary | ICD-10-CM | POA: Insufficient documentation

## 2015-03-02 DIAGNOSIS — F319 Bipolar disorder, unspecified: Secondary | ICD-10-CM | POA: Insufficient documentation

## 2015-03-02 DIAGNOSIS — J45909 Unspecified asthma, uncomplicated: Secondary | ICD-10-CM | POA: Insufficient documentation

## 2015-03-02 DIAGNOSIS — S90112A Contusion of left great toe without damage to nail, initial encounter: Secondary | ICD-10-CM | POA: Insufficient documentation

## 2015-03-02 DIAGNOSIS — F10921 Alcohol use, unspecified with intoxication delirium: Secondary | ICD-10-CM

## 2015-03-02 DIAGNOSIS — F1210 Cannabis abuse, uncomplicated: Secondary | ICD-10-CM | POA: Insufficient documentation

## 2015-03-02 DIAGNOSIS — F209 Schizophrenia, unspecified: Secondary | ICD-10-CM | POA: Insufficient documentation

## 2015-03-02 DIAGNOSIS — F10121 Alcohol abuse with intoxication delirium: Secondary | ICD-10-CM | POA: Insufficient documentation

## 2015-03-02 DIAGNOSIS — Z79899 Other long term (current) drug therapy: Secondary | ICD-10-CM | POA: Insufficient documentation

## 2015-03-02 DIAGNOSIS — Z3202 Encounter for pregnancy test, result negative: Secondary | ICD-10-CM | POA: Insufficient documentation

## 2015-03-02 DIAGNOSIS — S301XXA Contusion of abdominal wall, initial encounter: Secondary | ICD-10-CM | POA: Insufficient documentation

## 2015-03-02 DIAGNOSIS — S90412A Abrasion, left great toe, initial encounter: Secondary | ICD-10-CM | POA: Insufficient documentation

## 2015-03-02 DIAGNOSIS — Y92410 Unspecified street and highway as the place of occurrence of the external cause: Secondary | ICD-10-CM | POA: Insufficient documentation

## 2015-03-02 DIAGNOSIS — R1030 Lower abdominal pain, unspecified: Secondary | ICD-10-CM | POA: Insufficient documentation

## 2015-03-02 DIAGNOSIS — S0991XA Unspecified injury of ear, initial encounter: Secondary | ICD-10-CM | POA: Insufficient documentation

## 2015-03-02 DIAGNOSIS — Y9301 Activity, walking, marching and hiking: Secondary | ICD-10-CM | POA: Insufficient documentation

## 2015-03-02 MED ORDER — LORAZEPAM 2 MG/ML IJ SOLN
2.0000 mg | Freq: Once | INTRAMUSCULAR | Status: AC
  Administered 2015-03-03: 2 mg via INTRAMUSCULAR
  Filled 2015-03-02: qty 1

## 2015-03-02 MED ORDER — TETANUS-DIPHTH-ACELL PERTUSSIS 5-2.5-18.5 LF-MCG/0.5 IM SUSP
0.5000 mL | Freq: Once | INTRAMUSCULAR | Status: AC
  Administered 2015-03-03: 0.5 mL via INTRAMUSCULAR
  Filled 2015-03-02: qty 0.5

## 2015-03-02 MED ORDER — ZIPRASIDONE MESYLATE 20 MG IM SOLR
10.0000 mg | Freq: Once | INTRAMUSCULAR | Status: AC
  Administered 2015-03-02: 10 mg via INTRAMUSCULAR
  Filled 2015-03-02: qty 20

## 2015-03-02 MED ORDER — STERILE WATER FOR INJECTION IJ SOLN
INTRAMUSCULAR | Status: AC
  Filled 2015-03-02: qty 10

## 2015-03-02 NOTE — ED Notes (Signed)
Pt very aggressive towards staff. Pt cursing and yelling

## 2015-03-02 NOTE — ED Notes (Signed)
Pt refused for writer to draw blood work, MD at bedside. Writer will try again later.
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
## 2015-03-03 ENCOUNTER — Other Ambulatory Visit (HOSPITAL_COMMUNITY): Payer: Self-pay

## 2015-03-03 ENCOUNTER — Emergency Department (HOSPITAL_COMMUNITY): Payer: PRIVATE HEALTH INSURANCE

## 2015-03-03 ENCOUNTER — Emergency Department (HOSPITAL_COMMUNITY): Payer: Self-pay

## 2015-03-03 LAB — POC URINE PREG, ED: Preg Test, Ur: NEGATIVE

## 2015-03-03 LAB — COMPREHENSIVE METABOLIC PANEL
ALBUMIN: 4.1 g/dL (ref 3.5–5.0)
ALT: 85 U/L — AB (ref 14–54)
AST: 67 U/L — ABNORMAL HIGH (ref 15–41)
Alkaline Phosphatase: 69 U/L (ref 38–126)
Anion gap: 12 (ref 5–15)
CALCIUM: 8.4 mg/dL — AB (ref 8.9–10.3)
CO2: 22 mmol/L (ref 22–32)
Chloride: 106 mmol/L (ref 101–111)
Creatinine, Ser: 0.56 mg/dL (ref 0.44–1.00)
Glucose, Bld: 97 mg/dL (ref 65–99)
POTASSIUM: 3.4 mmol/L — AB (ref 3.5–5.1)
SODIUM: 140 mmol/L (ref 135–145)
TOTAL PROTEIN: 7.7 g/dL (ref 6.5–8.1)
Total Bilirubin: 0.4 mg/dL (ref 0.3–1.2)

## 2015-03-03 LAB — URINALYSIS, DIPSTICK ONLY
Bilirubin Urine: NEGATIVE
GLUCOSE, UA: NEGATIVE mg/dL
HGB URINE DIPSTICK: NEGATIVE
Ketones, ur: NEGATIVE mg/dL
LEUKOCYTES UA: NEGATIVE
Nitrite: NEGATIVE
PH: 6 (ref 5.0–8.0)
Protein, ur: NEGATIVE mg/dL
Specific Gravity, Urine: 1.005 (ref 1.005–1.030)
UROBILINOGEN UA: 0.2 mg/dL (ref 0.0–1.0)

## 2015-03-03 LAB — RAPID URINE DRUG SCREEN, HOSP PERFORMED
Amphetamines: NOT DETECTED
Barbiturates: NOT DETECTED
Benzodiazepines: NOT DETECTED
COCAINE: POSITIVE — AB
Opiates: NOT DETECTED
TETRAHYDROCANNABINOL: POSITIVE — AB

## 2015-03-03 LAB — CBC
HCT: 41.5 % (ref 36.0–46.0)
Hemoglobin: 14.3 g/dL (ref 12.0–15.0)
MCH: 31.8 pg (ref 26.0–34.0)
MCHC: 34.5 g/dL (ref 30.0–36.0)
MCV: 92.2 fL (ref 78.0–100.0)
PLATELETS: 229 10*3/uL (ref 150–400)
RBC: 4.5 MIL/uL (ref 3.87–5.11)
RDW: 13 % (ref 11.5–15.5)
WBC: 5.6 10*3/uL (ref 4.0–10.5)

## 2015-03-03 LAB — ACETAMINOPHEN LEVEL: Acetaminophen (Tylenol), Serum: <10 ug/mL — ABNORMAL LOW (ref 10–30)

## 2015-03-03 LAB — SALICYLATE LEVEL: Salicylate Lvl: <4 mg/dL (ref 2.8–30.0)

## 2015-03-03 LAB — ETHANOL: Alcohol, Ethyl (B): 231 mg/dL — ABNORMAL HIGH (ref <5)

## 2015-03-03 MED ORDER — ACETAMINOPHEN 500 MG PO TABS: 1000.0000 mg | ORAL_TABLET | Freq: Once | ORAL | Status: DC

## 2015-03-03 MED ORDER — IOHEXOL 300 MG/ML  SOLN
100.0000 mL | Freq: Once | INTRAMUSCULAR | Status: AC | PRN
Start: 2015-03-03 — End: 2015-03-03
  Administered 2015-03-03: 100 mL via INTRAVENOUS

## 2015-03-03 NOTE — Discharge Instructions (Signed)
Alcohol Intoxication Ms. Elvina SidleKeene, your CT scans did not show any injuries.  See a primary care physician within the next week to help you to quit drinking and using illicit drugs. If any symptoms worsen, come back to the ED immediately.  Thank you.   Alcohol intoxication occurs when you drink enough alcohol that it affects your ability to function. It can be mild or very severe. Drinking a lot of alcohol in a short time is called binge drinking. This can be very harmful. Drinking alcohol can also be more dangerous if you are taking medicines or other drugs. Some of the effects caused by alcohol may include:  Loss of coordination.  Changes in mood and behavior.  Unclear thinking.  Trouble talking (slurred speech).  Throwing up (vomiting).  Confusion.  Slowed breathing.  Twitching and shaking (seizures).  Loss of consciousness. HOME CARE  Do not drive after drinking alcohol.  Drink enough water and fluids to keep your pee (urine) clear or pale yellow. Avoid caffeine.  Only take medicine as told by your doctor. GET HELP IF:  You throw up (vomit) many times.  You do not feel better after a few days.  You frequently have alcohol intoxication. Your doctor can help decide if you should see a substance use treatment counselor. GET HELP RIGHT AWAY IF:  You become shaky when you stop drinking.  You have twitching and shaking.  You throw up blood. It may look bright red or like coffee grounds.  You notice blood in your poop (bowel movements).  You become lightheaded or pass out (faint). MAKE SURE YOU:   Understand these instructions.  Will watch your condition.  Will get help right away if you are not doing well or get worse. Document Released: 03/10/2008 Document Revised: 05/25/2013 Document Reviewed: 02/25/2013 [facility] Patient Information 2015 [place], [place]. This information is not intended to replace advice given to you by your health care provider. Make sure you  discuss any questions you have with your health care provider. Stimulant Use Disorder-Cocaine Cocaine is one of a group of powerful drugs called stimulants. Cocaine has medical uses for stopping nosebleeds and for pain control before minor nose or dental surgery. However, cocaine is misused because of the effects that it produces. These effects include:   A feeling of extreme pleasure.  Alertness.  High energy. Common street names for cocaine include coke, crack, blow, snow, and nose candy. Cocaine is snorted, dissolved in water and injected, or smoked.  Stimulants are addictive because they activate regions of the brain that produce both the pleasurable sensation of "reward" and psychological dependence. Together, these actions account for loss of control and the rapid development of drug dependence. This means you become ill without the drug (withdrawal) and need to keep using it to function.  Stimulant use disorder is use of stimulants that disrupts your daily life. It disrupts relationships with family and friends and how you do your job. Cocaine increases your blood pressure and heart rate. It can cause a heart attack or stroke. Cocaine can also cause death from irregular heart rate or seizures. SYMPTOMS Symptoms of stimulant use disorder with cocaine include:  Use of cocaine in larger amounts or over a longer period of time than intended.  Unsuccessful attempts to cut down or control cocaine use.  A lot of time spent obtaining, using, or recovering from the effects of cocaine.  A strong desire or urge to use cocaine (craving).  Continued use of cocaine in spite of  major problems at work, school, or home because of use.  Continued use of cocaine in spite of relationship problems because of use.  Giving up or cutting down on important life activities because of cocaine use.  Use of cocaine over and over in situations when it is physically hazardous, such as driving a  car.  Continued use of cocaine in spite of a physical problem that is likely related to use. Physical problems can include:  Malnutrition.  Nosebleeds.  Chest pain.  High blood pressure.  A hole that develops between the part of your nose that separates your nostrils (perforated nasal septum).  Lung and kidney damage.  Continued use of cocaine in spite of a mental problem that is likely related to use. Mental problems can include:  Schizophrenia-like symptoms.  Depression.  Bipolar mood swings.  Anxiety.  Sleep problems.  Need to use more and more cocaine to get the same effect, or lessened effect over time with use of the same amount of cocaine (tolerance).  Having withdrawal symptoms when cocaine use is stopped, or using cocaine to reduce or avoid withdrawal symptoms. Withdrawal symptoms include:  Depressed or irritable mood.  Low energy or restlessness.  Bad dreams.  Poor or excessive sleep.  Increased appetite. DIAGNOSIS Stimulant use disorder is diagnosed by your health care provider. You may be asked questions about your cocaine use and how it affects your life. A physical exam may be done. A drug screen may be ordered. You may be referred to a mental health professional. The diagnosis of stimulant use disorder requires at least two symptoms within 12 months. The type of stimulant use disorder depends on the number of signs and symptoms you have. The type may be:  Mild. Two or three signs and symptoms.  Moderate. Four or five signs and symptoms.  Severe. Six or more signs and symptoms. TREATMENT Treatment for stimulant use disorder is usually provided by mental health professionals with training in substance use disorders. The following options are available:  Counseling or talk therapy. Talk therapy addresses the reasons you use cocaine and ways to keep you from using again. Goals of talk therapy include:  Identifying and avoiding triggers for  use.  Handling cravings.  Replacing use with healthy activities.  Support groups. Support groups provide emotional support, advice, and guidance.  Medicine. Certain medicines may decrease cocaine cravings or withdrawal symptoms. HOME CARE INSTRUCTIONS  Take medicines only as directed by your health care provider.  Identify the people and activities that trigger your cocaine use and avoid them.  Keep all follow-up visits as directed by your health care provider. SEEK MEDICAL CARE IF:  Your symptoms get worse or you relapse.  You are not able to take medicines as directed. SEEK IMMEDIATE MEDICAL CARE IF:  You have serious thoughts about hurting yourself or others.  You have a seizure, chest pain, sudden weakness, or loss of speech or vision. FOR MORE INFORMATION  National Institute on Drug Abuse: http://www.price-smith.com/  Substance Abuse and Mental Health Services Administration: SkateOasis.com.pt Document Released: 09/19/2000 Document Revised: 02/06/2014 Document Reviewed: 10/05/2013 [facility] Patient Information 2015 [place], [place]. This information is not intended to replace advice given to you by your health care provider. Make sure you discuss any questions you have with your health care provider.
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
## 2015-05-24 ENCOUNTER — Emergency Department (HOSPITAL_COMMUNITY)
Admission: EM | Admit: 2015-05-24 | Discharge: 2015-05-25 | Disposition: A | Discharge status: Home / Self Care | Payer: PRIVATE HEALTH INSURANCE | Attending: Emergency Medicine | Admitting: Emergency Medicine

## 2015-05-24 ENCOUNTER — Encounter (HOSPITAL_COMMUNITY): Payer: Self-pay

## 2015-05-24 ENCOUNTER — Emergency Department (HOSPITAL_COMMUNITY): Payer: PRIVATE HEALTH INSURANCE

## 2015-05-24 DIAGNOSIS — Z79899 Other long term (current) drug therapy: Secondary | ICD-10-CM | POA: Insufficient documentation

## 2015-05-24 DIAGNOSIS — Y9389 Activity, other specified: Secondary | ICD-10-CM | POA: Insufficient documentation

## 2015-05-24 DIAGNOSIS — Y998 Other external cause status: Secondary | ICD-10-CM | POA: Insufficient documentation

## 2015-05-24 DIAGNOSIS — F1410 Cocaine abuse, uncomplicated: Secondary | ICD-10-CM | POA: Insufficient documentation

## 2015-05-24 DIAGNOSIS — F209 Schizophrenia, unspecified: Secondary | ICD-10-CM | POA: Insufficient documentation

## 2015-05-24 DIAGNOSIS — S022XXA Fracture of nasal bones, initial encounter for closed fracture: Secondary | ICD-10-CM

## 2015-05-24 DIAGNOSIS — Y9289 Other specified places as the place of occurrence of the external cause: Secondary | ICD-10-CM | POA: Insufficient documentation

## 2015-05-24 DIAGNOSIS — Z23 Encounter for immunization: Secondary | ICD-10-CM | POA: Insufficient documentation

## 2015-05-24 DIAGNOSIS — F319 Bipolar disorder, unspecified: Secondary | ICD-10-CM | POA: Insufficient documentation

## 2015-05-24 DIAGNOSIS — Z720 Tobacco use: Secondary | ICD-10-CM | POA: Insufficient documentation

## 2015-05-24 DIAGNOSIS — J45909 Unspecified asthma, uncomplicated: Secondary | ICD-10-CM | POA: Insufficient documentation

## 2015-05-24 DIAGNOSIS — R000 Tachycardia, unspecified: Secondary | ICD-10-CM | POA: Insufficient documentation

## 2015-05-24 DIAGNOSIS — Z3202 Encounter for pregnancy test, result negative: Secondary | ICD-10-CM | POA: Insufficient documentation

## 2015-05-24 LAB — CBC WITH DIFFERENTIAL/PLATELET
BASOS ABS: 0 10*3/uL (ref 0.0–0.1)
Basophils Relative: 0 % (ref 0–1)
EOS ABS: 0 10*3/uL (ref 0.0–0.7)
Eosinophils Relative: 0 % (ref 0–5)
HCT: 38 % (ref 36.0–46.0)
HEMOGLOBIN: 13.4 g/dL (ref 12.0–15.0)
LYMPHS ABS: 2.7 10*3/uL (ref 0.7–4.0)
LYMPHS PCT: 15 % (ref 12–46)
MCH: 30.9 pg (ref 26.0–34.0)
MCHC: 35.3 g/dL (ref 30.0–36.0)
MCV: 87.6 fL (ref 78.0–100.0)
Monocytes Absolute: 0.9 10*3/uL (ref 0.1–1.0)
Monocytes Relative: 5 % (ref 3–12)
NEUTROS PCT: 80 % — AB (ref 43–77)
Neutro Abs: 14.3 10*3/uL — ABNORMAL HIGH (ref 1.7–7.7)
Platelets: 243 10*3/uL (ref 150–400)
RBC: 4.34 MIL/uL (ref 3.87–5.11)
RDW: 12.4 % (ref 11.5–15.5)
WBC: 18 10*3/uL — AB (ref 4.0–10.5)

## 2015-05-24 LAB — COMPREHENSIVE METABOLIC PANEL
ALBUMIN: 4.3 g/dL (ref 3.5–5.0)
ALT: 27 U/L (ref 14–54)
ANION GAP: 17 — AB (ref 5–15)
AST: 29 U/L (ref 15–41)
Alkaline Phosphatase: 71 U/L (ref 38–126)
BILIRUBIN TOTAL: 0.5 mg/dL (ref 0.3–1.2)
BUN: 10 mg/dL (ref 6–20)
CO2: 18 mmol/L — ABNORMAL LOW (ref 22–32)
Calcium: 9 mg/dL (ref 8.9–10.3)
Chloride: 107 mmol/L (ref 101–111)
Creatinine, Ser: 0.7 mg/dL (ref 0.44–1.00)
GFR calc Af Amer: >60 mL/min (ref >60)
GLUCOSE: 76 mg/dL (ref 65–99)
POTASSIUM: 3.6 mmol/L (ref 3.5–5.1)
SODIUM: 142 mmol/L (ref 135–145)
TOTAL PROTEIN: 7.6 g/dL (ref 6.5–8.1)

## 2015-05-24 LAB — I-STAT BETA HCG BLOOD, ED (MC, WL, AP ONLY): I-stat hCG, quantitative: <5 m[IU]/mL (ref <5)

## 2015-05-24 LAB — ETHANOL: ALCOHOL ETHYL (B): 282 mg/dL — AB (ref <5)

## 2015-05-24 MED ORDER — TETANUS-DIPHTH-ACELL PERTUSSIS 5-2.5-18.5 LF-MCG/0.5 IM SUSP
0.5000 mL | Freq: Once | INTRAMUSCULAR | Status: AC
  Administered 2015-05-24: 0.5 mL via INTRAMUSCULAR
  Filled 2015-05-24: qty 0.5

## 2015-05-24 MED ORDER — GI COCKTAIL ~~LOC~~
30.0000 mL | Freq: Once | ORAL | Status: AC
  Administered 2015-05-24: 30 mL via ORAL
  Filled 2015-05-24: qty 30
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
## 2015-05-24 NOTE — ED Notes (Signed)
PA at the bedside.

## 2015-05-24 NOTE — SANE Note (Signed)
Dahlia Client RN called to advise of pt.  Pt has not been cleared medically.  Dahlia Client agrees to call back when pt is cleared.
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
## 2015-05-24 NOTE — ED Notes (Signed)
Patient declines interview. States she doesn't remember anything and doesn't want to talk at this time. Jeanne Schneiders RN notified.
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
## 2015-05-24 NOTE — ED Notes (Signed)
CSI at the bedside 

## 2015-05-24 NOTE — SANE Note (Signed)
Rec'd call from [place], pt is now medically cleared.  Advised her I would assess pt momentarily.

## 2015-05-24 NOTE — ED Notes (Signed)
Patient returned from CT. Placed back on the monitor.  
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
## 2015-05-24 NOTE — ED Notes (Signed)
SANE called and stated they would come down and see the pt.

## 2015-05-24 NOTE — ED Notes (Signed)
SANE at the bedside. Patient unable to wake up and told with her. Patient sleeping. Told when patient wakes up, to call.

## 2015-05-24 NOTE — ED Notes (Signed)
Phlebotomy at the bedside  

## 2015-05-25 LAB — RAPID URINE DRUG SCREEN, HOSP PERFORMED
Amphetamines: NOT DETECTED
BENZODIAZEPINES: NOT DETECTED
Barbiturates: NOT DETECTED
COCAINE: POSITIVE — AB
Opiates: NOT DETECTED
Tetrahydrocannabinol: NOT DETECTED

## 2015-05-25 MED ORDER — AZITHROMYCIN 1 G PO PACK
PACK | ORAL | Status: AC
  Administered 2015-05-25: 1 g
  Filled 2015-05-25: qty 1

## 2015-05-25 MED ORDER — METRONIDAZOLE 500 MG PO TABS
ORAL_TABLET | ORAL | Status: AC
  Administered 2015-05-25: 2000 mg
  Filled 2015-05-25: qty 4

## 2015-05-25 MED ORDER — ULIPRISTAL ACETATE 30 MG PO TABS
ORAL_TABLET | ORAL | Status: AC
  Administered 2015-05-25: 30 mg
  Filled 2015-05-25: qty 1

## 2015-05-25 MED ORDER — PROMETHAZINE HCL 25 MG PO TABS
ORAL_TABLET | ORAL | Status: AC
  Filled 2015-05-25: qty 3

## 2015-05-25 MED ORDER — CEFIXIME 400 MG PO CAPS
ORAL_CAPSULE | ORAL | Status: AC
  Administered 2015-05-25: 400 mg
  Filled 2015-05-25: qty 1
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
## 2015-05-25 NOTE — ED Notes (Signed)
[facility] team to come provide transport for pt.

## 2015-05-25 NOTE — ED Notes (Signed)
SANE RN discussing plan of care with ED provider at [place] will move patient to C pod so patient can sleep and further sober up so a SANE kit can be done in the morning; plan of care discussed with patient as well and patient verbalizes understanding;

## 2015-05-25 NOTE — ED Notes (Signed)
SANE nurse still with pt at this time. 
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
## 2015-05-25 NOTE — ED Notes (Signed)
Pt calling family/friend for a ride at desk.
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
## 2015-05-25 NOTE — Discharge Instructions (Signed)
1. Medications: Ibuprofen for pain, usual home medications 2. Treatment: rest, drink plenty of fluids, ice for facial swelling 3. Follow Up: Please followup with ENT as directed for discussion of your diagnoses and further evaluation after today's visit; if you do not have a primary care doctor use the resource guide provided to find one; Please return to the ER for worsening symptoms including fever, chills and other concerns    Facial Fracture A facial fracture is a break in one of the bones of your face. HOME CARE INSTRUCTIONS   Protect the injured part of your face until it is healed.  Do not participate in activities which give chance for re-injury until your doctor approves.  Gently wash and dry your face.  Wear head and facial protection while riding a bicycle, motorcycle, or snowmobile. SEEK MEDICAL CARE IF:   An oral temperature above 102 F (38.9 C) develops.  You have severe headaches or notice changes in your vision.  You have new numbness or tingling in your face.  You develop nausea (feeling sick to your stomach), vomiting or a stiff neck. SEEK IMMEDIATE MEDICAL CARE IF:   You develop difficulty seeing or experience double vision.  You become dizzy, lightheaded, or faint.  You develop trouble speaking, breathing, or swallowing.  You have a watery discharge from your nose or ear. MAKE SURE YOU:   Understand these instructions.  Will watch your condition.  Will get help right away if you are not doing well or get worse. Document Released: 09/22/2005 Document Revised: 12/15/2011 Document Reviewed: 05/11/2008 [facility] Patient Information 2015 [place], [place]. This information is not intended to replace advice given to you by your health care provider. Make sure you discuss any questions you have with your health care provider.    Emergency Department Resource Guide 1) Find a Doctor and Pay Out of Pocket Although you won't have to find out who is covered by  your insurance plan, it is a good idea to ask around and get recommendations. You will then need to call the office and see if the doctor you have chosen will accept you as a new patient and what types of options they offer for patients who are self-pay. Some doctors offer discounts or will set up payment plans for their patients who do not have insurance, but you will need to ask so you aren't surprised when you get to your appointment.  2) Contact Your Local Health Department Not all health departments have doctors that can see patients for sick visits, but many do, so it is worth a call to see if yours does. If you don't know where your local health department is, you can check in your phone book. The CDC also has a tool to help you locate your state's health department, and many state websites also have listings of all of their local health departments.  3) Find a Walk-in Clinic If your illness is not likely to be very severe or complicated, you may want to try a walk in clinic. These are popping up all over the country in pharmacies, drugstores, and shopping centers. They're usually staffed by nurse practitioners or physician assistants that have been trained to treat common illnesses and complaints. They're usually fairly quick and inexpensive. However, if you have serious medical issues or chronic medical problems, these are probably not your best option.  No Primary Care Doctor: - Call Health Connect at  [phone] - they can help you locate a primary care doctor that  accepts your insurance, provides certain services, etc. - Physician Referral Service- [phone]  Chronic Pain Problems: Organization         Address  Phone   Notes  [facility]  [phone]) [phone] Patients need to be referred by their primary care doctor.   Medication Assistance: Organization         Address  Phone   Notes  [facility] [address] [place], [place] [address] [phone]) [phone] --Must be a resident of [facility] [place] -- Must have NO insurance coverage whatsoever (no Medicaid/ Medicare, etc.) -- The pt. MUST have a primary care doctor that directs their care regularly and follows them in the community   MedAssist  [phone]) [phone]   [employer]  [phone]) [phone]    Agencies that provide inexpensive medical care: Organization         Address  Phone   Notes  [facility]  [phone]) [phone]   [facility]    [phone]) [phone]   [facility] [address] [place] [address] [place], [place] [address] [phone]) [phone]   [facility] [place] [address], [place] [phone]) [phone]   Planned Parenthood    [phone]) [phone]   [facility]    [phone]) [phone]   [facility]  [address], [place] Phone:  [phone]) [phone], Fax:  [phone]) [phone] Hours of Operation:  9 am - 6 pm, M-F.  Also accepts Medicaid/Medicare and self-pay.  [facility]  [address], [place] Phone: [phone](336) [phone], Fax: [phone](336) [phone]. Hours of Operation:  8:30 am - 5:30 pm, M-F.  Also accepts Medicaid and self-pay.  [facility] [place] [address], [place] Phone: [phone](336) [phone]   [facility] [address], [place], [place] [phone], Ext. [phone] Mondays & Thursdays: 7-9 AM.  First 15 patients are seen on a first come, first serve basis.    Medicaid-accepting [facility] Providers:  Organization         Address  Phone   Notes  [facility] [address], [place] [phone]) [phone] Also accepts self-pay patients.  [facility] [address], [place]  [phone]) [phone]   [facility] [address], [place] [phone]) [phone]   [facility] [address] [place] [address], [place] [phone]   Renaye RakersVeita Bland [address], [place]   [phone] Only accepts [place] Access [place] patients after they have their name applied to their card.   Self-Pay (no insurance) in [facility] [place]:  Organization         Address  Phone   Notes  Sickle Cell Patients, [facility] [address], [place] [phone]) [phone]   [facility] [address], [place] [phone]) [phone]   [facility] [place]  [address], [place] [phone]) [phone]   [facility]/Dr. Osei-Bonsu  [address] [place] [address], [place] or [phone], [place] [phone]) [phone] Phone number for both [place] and [place] locations is the same.  [facility] [address], [place] [phone]) [phone]   [facility] [place] [address] [place] [address], [place] or [address] [phone]) [phone]) [phone]   [facility] [address], [place] [phone], phone; [phone], fax Sees patients 1st and 3rd Saturday of every month.  Must not qualify for public or private insurance (i.e. Medicaid, Medicare, [place] Health Choice, Veterans' Benefits)  Household income should be no more than 200% of the poverty level The clinic cannot treat you if you are pregnant or think you are pregnant  Sexually transmitted diseases are not treated at the clinic.    Dental Care: Organization         Address  Phone  Notes  [facility] [address] [place], [place] [phone] Accepts children up to age 76 who are enrolled in [place] or [place] Health Choice; pregnant women with a Medicaid card; and children who have applied for Medicaid or [place] Health Choice, but were declined, whose parents can pay a reduced fee at time of service.  [facility]  [address], [place] [phone] Accepts children up to age 73 who are enrolled  in [place] or [place] Health Choice; pregnant women with a Medicaid card; and children who have applied for Medicaid or [place] Health Choice, but were declined, whose parents can pay a reduced fee at time of service.  [facility]  [address] [place], [place] [phone] Patients are seen by appointment only. Walk-ins are not accepted. [facility] will see patients 81 years of age and older. Monday - Tuesday (8am-5pm) Most Wednesdays (8:30-5pm) $30 per visit, cash only  [facility] Access PRO[facility]  [address], [facility] [phone] Patients are seen by appointment only. Walk-ins are not accepted. [facility] will see patients 66 years of age and older. One Wednesday Evening (Monthly: Volunteer Based).  $30 per visit, cash only  [employer]  [phone] for adults; Children under age 89, call Graduate Pediatric Dentistry at [phone]. Children aged 66-14, please call [phone] to request a pediatric application.  Dental services are provided in all areas of dental care including fillings, crowns and bridges, complete and partial dentures, implants, gum treatment, root canals, and extractions. Preventive care is also provided. Treatment is provided to both adults and children. Patients are selected via a lottery and there is often a waiting list.   [facility] [address], [place]  [phone] [email or web address]   [facility] [address] [place], [place] [phone], Ext. [phone] Second and Fourth Thursday of each month, opens at 6:30 AM; Clinic ends at 9 AM.  Patients are seen on a first-come first-served basis, and a limited number are seen during each clinic.   [facility]  [address] Ether [address]ins [place], [place] [phone]   Eligibility Requirements You must have lived in [place], [place], or [place] counties for at least the last three months.   You cannot be  eligible for state or federal sponsored [place], including [employer], [place], or [employer].   You generally cannot be eligible for healthcare insurance through your employer.    How to apply: Eligibility screenings are held every Tuesday and Wednesday afternoon from 1:00 pm until 4:00 pm. You do not need an appointment for the interview!  [facility] [address], [place], [place] [phone]   [facility] Health Department  [phone]   [facility] Health Department  [phone]   [facility] Department  [phone]    Behavioral Health Resources in the Community: Intensive Outpatient Programs Organization         Address  Phone  Notes  [facility] [address], [place], [place] [phone]   [facility] Outpatient [address], [place], [place] [phone]   [facility] [address] [place], [place]  [phone]   [facility] [address],  [place], [place] [phone] or [phone]   Substance Abuse Resources Organization         Address  Phone  Notes  [facility]  [phone]   [facility]  [phone]   [facility] [place]  [phone]   Floydene Flock  [phone]   Residential & Outpatient Substance Abuse Program  [phone]   Psychological Services Organization         Address  Phone  Notes  [facility] Behavior[facility] Health  [phone]   [facility] Services  [phone]   [facility] [address], [place] [phone] or [phone]    Mobile Crisis Teams Organization         Address  Phone  Notes  [facility], Mobile Crisis Care Unit  [phone]   [facility]  [address] [place], [place] [phone]   Doristine Locks [address]  [place] [phone]    Self-Help/Support Groups Organization          Address  Phone             Notes  [facility] [place] - variety of support groups  336- I7437963 Call for more information  Narcotics Anonymous (NA), [facility] [address], [employer] [place]  2 meetings at this location   Statistician         Address  Phone  Notes  [facility] [address],    [place]  [phone]   [facility]  [address] [place] [address]19, [place], [place] [phone]   [facility] [address] [place], [place] [phone] Admissions: 8am-3pm M-F  [facility] [address],    [place], [place] [phone]   [facility] [address] [place], [place], [place] [phone]   [facility] [address]  [place], [place] [phone]   [facility] - Intensive Outpatient [address], [place], [place] [phone]   [facility] ([facility] [address] [place]  [place], [place] [phone] or [phone]   [facility] ([facility]) [address] [place] [address], [place], [place] [phone] Accepts Medicaid  [facility] [address],  [place] [phone] Substance Abuse/Addiction Treatment   [facility] Organization         Address  Phone  Notes  [facility]  [phone]   Angie Fava, PhD [address]vin Kn[address] [place], [place]   [phone] or [phone]   [facility]   [address] [place], [place] [phone]   [facility] [address], [place], [place] [phone] Insurance/Medicaid/sponsorship through [employer] and Families [address]                                    [place], [place] [phone] Therapy/tele-psych/case  [facility] [address]   [place], [place] [phone]    Dr. Lolly Mustache  [phone]   [facility] [place]  [employer]  [facility] Dept. 1) [address] [place] 2) [address] [place] 3)  [address], [place] [phone]  [phone]   [facility] Child Abuse Hotline 434 049 3040 or [phone] (After Hours)
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
## 2015-05-29 ENCOUNTER — Telehealth: Payer: Self-pay | Admitting: Obstetrics & Gynecology

## 2015-05-29 NOTE — Telephone Encounter (Signed)
Called patient to inform her about her scheduled appointment. Her mother answered and stated patient does not live with her. Mother wanted to know what the appointment was for. Informed patient mother this is a routine visit.

## 2015-06-18 ENCOUNTER — Encounter: Payer: Self-pay | Admitting: Obstetrics & Gynecology

## 2015-07-18 ENCOUNTER — Inpatient Hospital Stay (HOSPITAL_COMMUNITY)
Admission: EM | Admit: 2015-07-18 | Discharge: 2015-07-26 | DRG: 605 | Disposition: A | Discharge status: Other Acute Inpatient Hospital | Payer: Federal, State, Local not specified - Other | Attending: Neurosurgery | Admitting: Neurosurgery

## 2015-07-18 ENCOUNTER — Encounter (HOSPITAL_COMMUNITY): Payer: Self-pay | Admitting: Emergency Medicine

## 2015-07-18 ENCOUNTER — Emergency Department (HOSPITAL_COMMUNITY): Payer: PRIVATE HEALTH INSURANCE

## 2015-07-18 DIAGNOSIS — F419 Anxiety disorder, unspecified: Secondary | ICD-10-CM | POA: Diagnosis present

## 2015-07-18 DIAGNOSIS — F209 Schizophrenia, unspecified: Secondary | ICD-10-CM | POA: Diagnosis present

## 2015-07-18 DIAGNOSIS — F319 Bipolar disorder, unspecified: Secondary | ICD-10-CM | POA: Diagnosis present

## 2015-07-18 DIAGNOSIS — Z590 Homelessness: Secondary | ICD-10-CM

## 2015-07-18 DIAGNOSIS — Y906 Blood alcohol level of 120-199 mg/100 ml: Secondary | ICD-10-CM | POA: Diagnosis present

## 2015-07-18 DIAGNOSIS — S0193XA Puncture wound without foreign body of unspecified part of head, initial encounter: Secondary | ICD-10-CM

## 2015-07-18 DIAGNOSIS — S0184XA Puncture wound with foreign body of other part of head, initial encounter: Principal | ICD-10-CM | POA: Diagnosis present

## 2015-07-18 DIAGNOSIS — W3400XA Accidental discharge from unspecified firearms or gun, initial encounter: Secondary | ICD-10-CM

## 2015-07-18 DIAGNOSIS — F3110 Bipolar disorder, current episode manic without psychotic features, unspecified: Secondary | ICD-10-CM | POA: Diagnosis present

## 2015-07-18 DIAGNOSIS — F10129 Alcohol abuse with intoxication, unspecified: Secondary | ICD-10-CM | POA: Diagnosis present

## 2015-07-18 DIAGNOSIS — T148XXA Other injury of unspecified body region, initial encounter: Secondary | ICD-10-CM

## 2015-07-18 DIAGNOSIS — F1414 Cocaine abuse with cocaine-induced mood disorder: Secondary | ICD-10-CM | POA: Diagnosis present

## 2015-07-18 DIAGNOSIS — X72XXXA Intentional self-harm by handgun discharge, initial encounter: Secondary | ICD-10-CM

## 2015-07-18 DIAGNOSIS — F17200 Nicotine dependence, unspecified, uncomplicated: Secondary | ICD-10-CM | POA: Diagnosis present

## 2015-07-18 DIAGNOSIS — Z9119 Patient's noncompliance with other medical treatment and regimen: Secondary | ICD-10-CM

## 2015-07-18 LAB — CBC WITH DIFFERENTIAL/PLATELET
BASOS PCT: 0 %
Basophils Absolute: 0 10*3/uL (ref 0.0–0.1)
EOS ABS: 0.1 10*3/uL (ref 0.0–0.7)
Eosinophils Relative: 1 %
HCT: 37.8 % (ref 36.0–46.0)
Hemoglobin: 12.9 g/dL (ref 12.0–15.0)
Lymphocytes Relative: 38 %
Lymphs Abs: 2.5 10*3/uL (ref 0.7–4.0)
MCH: 29.8 pg (ref 26.0–34.0)
MCHC: 34.1 g/dL (ref 30.0–36.0)
MCV: 87.3 fL (ref 78.0–100.0)
MONO ABS: 0.3 10*3/uL (ref 0.1–1.0)
MONOS PCT: 5 %
Neutro Abs: 3.8 10*3/uL (ref 1.7–7.7)
Neutrophils Relative %: 56 %
Platelets: 208 10*3/uL (ref 150–400)
RBC: 4.33 MIL/uL (ref 3.87–5.11)
RDW: 12.4 % (ref 11.5–15.5)
WBC: 6.7 10*3/uL (ref 4.0–10.5)

## 2015-07-18 LAB — RAPID URINE DRUG SCREEN, HOSP PERFORMED
Amphetamines: NOT DETECTED
BARBITURATES: NOT DETECTED
BENZODIAZEPINES: NOT DETECTED
COCAINE: POSITIVE — AB
OPIATES: NOT DETECTED
Tetrahydrocannabinol: NOT DETECTED

## 2015-07-18 LAB — ACETAMINOPHEN LEVEL

## 2015-07-18 LAB — COMPREHENSIVE METABOLIC PANEL
ALBUMIN: 4 g/dL (ref 3.5–5.0)
ALT: 15 U/L (ref 14–54)
ANION GAP: 8 (ref 5–15)
AST: 21 U/L (ref 15–41)
Alkaline Phosphatase: 66 U/L (ref 38–126)
BILIRUBIN TOTAL: 0.4 mg/dL (ref 0.3–1.2)
BUN: 8 mg/dL (ref 6–20)
CO2: 24 mmol/L (ref 22–32)
Calcium: 8.6 mg/dL — ABNORMAL LOW (ref 8.9–10.3)
Chloride: 110 mmol/L (ref 101–111)
Creatinine, Ser: 0.65 mg/dL (ref 0.44–1.00)
GFR calc non Af Amer: >60 mL/min (ref >60)
GLUCOSE: 92 mg/dL (ref 65–99)
POTASSIUM: 3.3 mmol/L — AB (ref 3.5–5.1)
SODIUM: 142 mmol/L (ref 135–145)
TOTAL PROTEIN: 7.6 g/dL (ref 6.5–8.1)

## 2015-07-18 LAB — MRSA PCR SCREENING: MRSA BY PCR: NEGATIVE

## 2015-07-18 LAB — URINE MICROSCOPIC-ADD ON

## 2015-07-18 LAB — URINALYSIS, ROUTINE W REFLEX MICROSCOPIC
BILIRUBIN URINE: NEGATIVE
Glucose, UA: NEGATIVE mg/dL
Ketones, ur: NEGATIVE mg/dL
LEUKOCYTES UA: NEGATIVE
NITRITE: NEGATIVE
PH: 6.5 (ref 5.0–8.0)
Protein, ur: NEGATIVE mg/dL
SPECIFIC GRAVITY, URINE: 1.004 — AB (ref 1.005–1.030)
Urobilinogen, UA: 0.2 mg/dL (ref 0.0–1.0)

## 2015-07-18 LAB — PREGNANCY, URINE: Preg Test, Ur: NEGATIVE

## 2015-07-18 LAB — SALICYLATE LEVEL: Salicylate Lvl: <4 mg/dL (ref 2.8–30.0)

## 2015-07-18 LAB — ETHANOL: Alcohol, Ethyl (B): 196 mg/dL — ABNORMAL HIGH (ref <5)

## 2015-07-18 MED ORDER — MICONAZOLE NITRATE 2 % VA CREA
1.0000 | TOPICAL_CREAM | Freq: Every day | VAGINAL | Status: DC
  Administered 2015-07-18 – 2015-07-24 (×7): 1 via VAGINAL
  Filled 2015-07-18 (×3): qty 45

## 2015-07-18 MED ORDER — HALOPERIDOL LACTATE 5 MG/ML IJ SOLN
INTRAMUSCULAR | Status: AC
  Filled 2015-07-18: qty 1

## 2015-07-18 MED ORDER — HYDROMORPHONE HCL 1 MG/ML IJ SOLN
0.5000 mg | INTRAMUSCULAR | Status: DC | PRN
  Administered 2015-07-18 – 2015-07-20 (×19): 1 mg via INTRAVENOUS
  Administered 2015-07-20: 0.5 mg via INTRAVENOUS
  Administered 2015-07-21 – 2015-07-24 (×28): 1 mg via INTRAVENOUS
  Filled 2015-07-18 (×49): qty 1

## 2015-07-18 MED ORDER — LABETALOL HCL 5 MG/ML IV SOLN: 10.0000 mg | INTRAVENOUS | Status: DC | PRN

## 2015-07-18 MED ORDER — DEXTROSE 5 % IV SOLN
2.0000 g | Freq: Once | INTRAVENOUS | Status: AC
  Administered 2015-07-18: 2 g via INTRAVENOUS
  Filled 2015-07-18: qty 2

## 2015-07-18 MED ORDER — DIVALPROEX SODIUM 500 MG PO DR TAB
625.0000 mg | DELAYED_RELEASE_TABLET | Freq: Two times a day (BID) | ORAL | Status: DC
  Filled 2015-07-18 (×16): qty 1

## 2015-07-18 MED ORDER — ONDANSETRON HCL 4 MG PO TABS: 4.0000 mg | ORAL_TABLET | ORAL | Status: DC | PRN

## 2015-07-18 MED ORDER — SODIUM CHLORIDE 0.9 % IV SOLN
500.0000 mg | Freq: Two times a day (BID) | INTRAVENOUS | Status: DC
  Administered 2015-07-18 – 2015-07-21 (×6): 500 mg via INTRAVENOUS
  Filled 2015-07-18 (×7): qty 5

## 2015-07-18 MED ORDER — SODIUM CHLORIDE 0.9 % IV SOLN
1000.0000 mg | Freq: Once | INTRAVENOUS | Status: AC
  Administered 2015-07-18: 1000 mg via INTRAVENOUS
  Filled 2015-07-18: qty 10

## 2015-07-18 MED ORDER — VANCOMYCIN HCL 10 G IV SOLR
1500.0000 mg | Freq: Once | INTRAVENOUS | Status: AC
  Administered 2015-07-18: 1500 mg via INTRAVENOUS
  Filled 2015-07-18: qty 1500

## 2015-07-18 MED ORDER — HYDROCODONE-ACETAMINOPHEN 5-325 MG PO TABS
1.0000 | ORAL_TABLET | ORAL | Status: DC | PRN
  Administered 2015-07-18 – 2015-07-22 (×15): 1 via ORAL
  Filled 2015-07-18 (×15): qty 1

## 2015-07-18 MED ORDER — PROMETHAZINE HCL 25 MG PO TABS: 12.5000 mg | ORAL_TABLET | ORAL | Status: DC | PRN

## 2015-07-18 MED ORDER — LORAZEPAM 2 MG/ML IJ SOLN
INTRAMUSCULAR | Status: AC
  Filled 2015-07-18: qty 1

## 2015-07-18 MED ORDER — PANTOPRAZOLE SODIUM 40 MG IV SOLR
40.0000 mg | Freq: Every day | INTRAVENOUS | Status: DC
  Administered 2015-07-18 – 2015-07-20 (×3): 40 mg via INTRAVENOUS
  Filled 2015-07-18 (×3): qty 40

## 2015-07-18 MED ORDER — ONDANSETRON HCL 4 MG/2ML IJ SOLN
4.0000 mg | INTRAMUSCULAR | Status: DC | PRN
  Filled 2015-07-18: qty 2

## 2015-07-18 MED ORDER — OLANZAPINE 10 MG PO TABS
10.0000 mg | ORAL_TABLET | Freq: Two times a day (BID) | ORAL | Status: DC
  Administered 2015-07-18 – 2015-07-23 (×11): 10 mg via ORAL
  Filled 2015-07-18 (×15): qty 1

## 2015-07-18 MED ORDER — TETANUS-DIPHTH-ACELL PERTUSSIS 5-2.5-18.5 LF-MCG/0.5 IM SUSP
INTRAMUSCULAR | Status: AC
  Filled 2015-07-18: qty 0.5

## 2015-07-18 MED ORDER — LORAZEPAM 2 MG/ML IJ SOLN
2.0000 mg | Freq: Once | INTRAMUSCULAR | Status: AC
  Administered 2015-07-18: 2 mg via INTRAMUSCULAR

## 2015-07-18 MED ORDER — CEFAZOLIN SODIUM-DEXTROSE 2-3 GM-% IV SOLR
2.0000 g | Freq: Three times a day (TID) | INTRAVENOUS | Status: AC
  Administered 2015-07-18 (×2): 2 g via INTRAVENOUS
  Filled 2015-07-18 (×2): qty 50

## 2015-07-18 MED ORDER — ACETAMINOPHEN 650 MG RE SUPP: 650.0000 mg | RECTAL | Status: DC | PRN

## 2015-07-18 MED ORDER — BENZTROPINE MESYLATE 0.5 MG PO TABS
1.0000 mg | ORAL_TABLET | Freq: Every day | ORAL | Status: DC
  Administered 2015-07-19 – 2015-07-23 (×5): 1 mg via ORAL
  Filled 2015-07-18 (×2): qty 2
  Filled 2015-07-18: qty 1
  Filled 2015-07-18 (×2): qty 2
  Filled 2015-07-18: qty 1
  Filled 2015-07-18: qty 2

## 2015-07-18 MED ORDER — DIPHENHYDRAMINE HCL 50 MG/ML IJ SOLN
INTRAMUSCULAR | Status: AC
  Filled 2015-07-18: qty 1

## 2015-07-18 MED ORDER — ACETAMINOPHEN 325 MG PO TABS
650.0000 mg | ORAL_TABLET | ORAL | Status: DC | PRN
  Administered 2015-07-24 – 2015-07-26 (×4): 650 mg via ORAL
  Filled 2015-07-18 (×4): qty 2

## 2015-07-18 MED ORDER — SODIUM CHLORIDE 0.9 % IV SOLN
INTRAVENOUS | Status: DC
  Administered 2015-07-18 – 2015-07-24 (×6): via INTRAVENOUS
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
## 2015-07-18 NOTE — ED Notes (Signed)
[facility] here and she is transferred without incident.
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
## 2015-07-18 NOTE — ED Notes (Signed)
She atouses slowly and is oriented to all except day/date/time.  PEARL at 6mm.  Bilat. Eyelid edema noted R = L.  Sm. Scab noted just superior to right ear pinna.

## 2015-07-19 ENCOUNTER — Inpatient Hospital Stay (HOSPITAL_COMMUNITY): Payer: PRIVATE HEALTH INSURANCE

## 2015-07-19 DIAGNOSIS — S0181XA Laceration without foreign body of other part of head, initial encounter: Secondary | ICD-10-CM | POA: Diagnosis not present

## 2015-07-19 DIAGNOSIS — F3110 Bipolar disorder, current episode manic without psychotic features, unspecified: Secondary | ICD-10-CM

## 2015-07-19 DIAGNOSIS — F1414 Cocaine abuse with cocaine-induced mood disorder: Secondary | ICD-10-CM

## 2015-07-19 DIAGNOSIS — T1491 Suicide attempt: Secondary | ICD-10-CM

## 2015-07-19 DIAGNOSIS — F10220 Alcohol dependence with intoxication, uncomplicated: Secondary | ICD-10-CM | POA: Diagnosis not present

## 2015-07-19 DIAGNOSIS — R45851 Suicidal ideations: Secondary | ICD-10-CM

## 2015-07-19 DIAGNOSIS — X749XXA Intentional self-harm by unspecified firearm discharge, initial encounter: Secondary | ICD-10-CM | POA: Diagnosis not present

## 2015-07-19 DIAGNOSIS — Y9289 Other specified places as the place of occurrence of the external cause: Secondary | ICD-10-CM

## 2015-07-19 MED ORDER — NICOTINE 21 MG/24HR TD PT24
21.0000 mg | MEDICATED_PATCH | Freq: Every day | TRANSDERMAL | Status: DC
  Administered 2015-07-19 – 2015-07-26 (×8): 21 mg via TRANSDERMAL
  Filled 2015-07-19 (×8): qty 1

## 2015-07-19 NOTE — Progress Notes (Signed)
~  0430: Pt awakened to assess and take to CT. Pt wanted to use bathroom before head scan. Pt demanded she needed "a meal" before going to head scan and states "I don't feel good". When questioning pt, she states," I just don't feel good, I'm not going to CT!" Explained to pt the benefit of going to CT. Pt continues to refuse. Transport updated.
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
## 2015-07-19 NOTE — Progress Notes (Signed)
Spoke with patient to assess ETOH use to determine if CIWA protocol was necessary.  Patient states that she does drink daily and is not sure of amount because "I black out" almost every day.  She drinks beer, malt liquor and "hard liquor" daily. Will assess for withdraw and begin CIWA protocol. Airica Schwartzkopf C 07/19/2015 10:05 AM

## 2015-07-20 DIAGNOSIS — X749XXA Intentional self-harm by unspecified firearm discharge, initial encounter: Secondary | ICD-10-CM | POA: Diagnosis not present

## 2015-07-20 DIAGNOSIS — F1414 Cocaine abuse with cocaine-induced mood disorder: Secondary | ICD-10-CM | POA: Diagnosis not present

## 2015-07-20 DIAGNOSIS — F3110 Bipolar disorder, current episode manic without psychotic features, unspecified: Secondary | ICD-10-CM | POA: Diagnosis not present

## 2015-07-20 DIAGNOSIS — S0181XA Laceration without foreign body of other part of head, initial encounter: Secondary | ICD-10-CM | POA: Diagnosis not present

## 2015-07-20 MED ORDER — NICOTINE POLACRILEX 2 MG MT GUM
2.0000 mg | CHEWING_GUM | OROMUCOSAL | Status: DC | PRN
  Administered 2015-07-20 – 2015-07-25 (×9): 2 mg via ORAL
  Filled 2015-07-20 (×14): qty 1

## 2015-07-20 MED ORDER — OLANZAPINE 10 MG PO TABS: 10.0000 mg | ORAL_TABLET | Freq: Two times a day (BID) | ORAL | Status: DC
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
## 2015-07-20 NOTE — Clinical Social Work Psych Note (Signed)
Per medical staff, patient is medically cleared for discharge.  Patient has been referred to the following facilities:  [facility], [facility], [facility], [facility], [facility].  Vickii PennaGina Sachi Boulay, LCSW [phone]  [facility] Licensed Clinical Social Worker

## 2015-07-20 NOTE — Progress Notes (Signed)
Ready for inpt psychiatry care.  Transfer ASAP

## 2015-07-20 NOTE — Care Management Note (Signed)
Case Management Note  Patient Details  Name: Cindra PresumeJennifer D Mcglory MRN: [id number] Date of Birth: 12/18/1985  Subjective/Objective:                    Action/Plan: Patient being discharged to inpatient psyche. No further CM needs.   Expected Discharge Date:                  Expected Discharge Plan:  [facility]  In-House Referral:  Clinical Social Work  Discharge planning Services  CM Consult  Post Acute Care Choice:    Choice offered to:     DME Arranged:    DME Agency:     HH Arranged:    HH Agency:     Status of Service:  In process, will continue to follow  Medicare Important Message Given:    Date Medicare IM Given:    Medicare IM give by:    Date Additional Medicare IM Given:    Additional Medicare Important Message give by:     If discussed at Long Length of Stay Meetings, dates discussed:    Additional Comments:  Kermit BaloKelli F Eryx Zane, RN 07/20/2015, 11:09 AM

## 2015-07-20 NOTE — [facility] Counselor (Signed)
Pending review for possible placement with [facility].  
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
## 2015-07-20 NOTE — Progress Notes (Signed)
Still awaiting psychiatry transfer; patient medically stable.

## 2015-07-21 ENCOUNTER — Encounter (HOSPITAL_COMMUNITY): Payer: Self-pay

## 2015-07-21 MED ORDER — PANTOPRAZOLE SODIUM 40 MG PO TBEC
40.0000 mg | DELAYED_RELEASE_TABLET | Freq: Every day | ORAL | Status: DC
  Administered 2015-07-21 – 2015-07-25 (×5): 40 mg via ORAL
  Filled 2015-07-21 (×5): qty 1

## 2015-07-21 MED ORDER — LEVETIRACETAM 500 MG PO TABS
500.0000 mg | ORAL_TABLET | Freq: Two times a day (BID) | ORAL | Status: DC
  Administered 2015-07-21 – 2015-07-24 (×6): 500 mg via ORAL
  Filled 2015-07-21 (×6): qty 1
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
## 2015-07-21 NOTE — Progress Notes (Signed)
Disposition CSW completed patient referrals to the following inpatient psych facilities:  [facility]  CSW will continue to assist with placement needs.  Seward SpeckLeo Madline Oesterling [facility] FacilityCSW,LCAS Behavioral Health Disposition CSW [phone]

## 2015-07-22 MED ORDER — OXYCODONE-ACETAMINOPHEN 5-325 MG PO TABS
2.0000 | ORAL_TABLET | ORAL | Status: DC | PRN
  Administered 2015-07-22 – 2015-07-24 (×10): 2 via ORAL
  Filled 2015-07-22 (×10): qty 2

## 2015-07-22 NOTE — Progress Notes (Signed)
Patient has a low BP this am, but she insisted to have both her pain medicines together. RN administered as requested. IVF in situ, will keep monitoring BP's.

## 2015-07-22 NOTE — Progress Notes (Signed)
Patient seen and examined. She is awake and alert and conversant. She moves all extremities. She denies suicidal ideation. She continues to complain of pain and wants more pain medication though she looks completely comfortable.

## 2015-07-23 NOTE — Clinical Social Work Psych Note (Addendum)
Covering Psych CSW has completed the following referrals on 07/23/2015:  [facility]  Covering Psych CSW continuing to search for bed availability at an inpatient psych facility.   Marcelline Deistmily Janki Dike, [place]WA - [phone] Clinical Social Work Department Orthopedics [phone]) and Surgical [phone])
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
## 2015-07-23 NOTE — Progress Notes (Signed)
Patient continuously moving IV around in arm, taking tape on and off. Patient instructed to stop moving IV around. Patient's IV infiltrated into arm. IV removed. Patient offered warm compress, patient refused. Patient states she needs to get her IV dilaudid dosage since the IV infiltrated, she did not get dosage. Patient instructed she will have to wait for next dosage time to have dilaudid administered. Patient became agitated, threw arms in air. Patient told she will have a new IV placed. Report given to night RN. Will continue to monitor.

## 2015-07-24 MED ORDER — BUSPIRONE HCL 10 MG PO TABS
5.0000 mg | ORAL_TABLET | Freq: Two times a day (BID) | ORAL | Status: DC
  Administered 2015-07-24 – 2015-07-26 (×5): 5 mg via ORAL
  Filled 2015-07-24 (×5): qty 1

## 2015-07-24 MED ORDER — HYDROXYZINE HCL 25 MG PO TABS
25.0000 mg | ORAL_TABLET | Freq: Two times a day (BID) | ORAL | Status: DC
  Administered 2015-07-24 – 2015-07-26 (×5): 25 mg via ORAL
  Filled 2015-07-24 (×5): qty 1

## 2015-07-24 MED ORDER — OLANZAPINE 10 MG PO TABS: 10.0000 mg | ORAL_TABLET | Freq: Every day | ORAL | Status: DC

## 2015-07-24 MED ORDER — OLANZAPINE 10 MG PO TABS
10.0000 mg | ORAL_TABLET | Freq: Every day | ORAL | Status: DC
  Administered 2015-07-24 – 2015-07-25 (×2): 10 mg via ORAL
  Filled 2015-07-24 (×4): qty 1
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
## 2015-07-24 NOTE — Progress Notes (Signed)
Patient remains hospitalized secondary to lack of inpatient psychiatric care availability. Patient continues to be on an inappropriate medical service. She is not receiving the psychiatric care that she should. This is an unacceptable situation. Patient appears to be abusing IV narcotics at this point. We will discontinue all narcotic medication and IVs at this point. Neurologically she stable. She has no inpatient medical needs beyond her psychiatric condition.
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
## 2015-07-25 DIAGNOSIS — F1414 Cocaine abuse with cocaine-induced mood disorder: Secondary | ICD-10-CM | POA: Diagnosis not present

## 2015-07-25 DIAGNOSIS — X749XXA Intentional self-harm by unspecified firearm discharge, initial encounter: Secondary | ICD-10-CM | POA: Diagnosis not present

## 2015-07-25 DIAGNOSIS — S0181XA Laceration without foreign body of other part of head, initial encounter: Secondary | ICD-10-CM | POA: Diagnosis not present

## 2015-07-25 DIAGNOSIS — F10220 Alcohol dependence with intoxication, uncomplicated: Secondary | ICD-10-CM
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
## 2015-07-25 NOTE — Progress Notes (Signed)
Patient remains on inappropriate medical service. Psychiatry services not rounding on this patient nor have they arranged transfer to a psychiatric hospital. Clinical situation for my standpoint stable. Still awaiting transfer.

## 2015-07-25 NOTE — Progress Notes (Signed)
Patient ID: [name], female   DOB: 12/06/1985, 29 y.o.   MRN: [id number]  Patient requested to contact her BF [name] and gave phone number [phone]. Try to reach the phone number with out success at this time. Will ask LCSW to try reach him later to obtain collateral information.  [name],JANARDHAHA R. 07/25/2015 11:48 AM

## 2015-07-26 ENCOUNTER — Inpatient Hospital Stay (HOSPITAL_COMMUNITY)
Admission: AD | Admit: 2015-07-26 | Discharge: 2015-08-02 | DRG: 885 | Disposition: A | Discharge status: Home / Self Care | Payer: Federal, State, Local not specified - Other | Source: Intra-hospital | Attending: Psychiatry | Admitting: Psychiatry

## 2015-07-26 ENCOUNTER — Encounter (HOSPITAL_COMMUNITY): Payer: Self-pay

## 2015-07-26 DIAGNOSIS — F319 Bipolar disorder, unspecified: Secondary | ICD-10-CM | POA: Diagnosis present

## 2015-07-26 DIAGNOSIS — F3163 Bipolar disorder, current episode mixed, severe, without psychotic features: Secondary | ICD-10-CM | POA: Diagnosis not present

## 2015-07-26 DIAGNOSIS — Y906 Blood alcohol level of 120-199 mg/100 ml: Secondary | ICD-10-CM | POA: Diagnosis present

## 2015-07-26 DIAGNOSIS — F1414 Cocaine abuse with cocaine-induced mood disorder: Secondary | ICD-10-CM | POA: Diagnosis present

## 2015-07-26 DIAGNOSIS — F1020 Alcohol dependence, uncomplicated: Secondary | ICD-10-CM | POA: Diagnosis not present

## 2015-07-26 DIAGNOSIS — F1420 Cocaine dependence, uncomplicated: Secondary | ICD-10-CM | POA: Diagnosis not present

## 2015-07-26 DIAGNOSIS — F410 Panic disorder [episodic paroxysmal anxiety] without agoraphobia: Secondary | ICD-10-CM | POA: Diagnosis present

## 2015-07-26 DIAGNOSIS — S0181XA Laceration without foreign body of other part of head, initial encounter: Secondary | ICD-10-CM | POA: Diagnosis not present

## 2015-07-26 DIAGNOSIS — F209 Schizophrenia, unspecified: Secondary | ICD-10-CM | POA: Diagnosis present

## 2015-07-26 DIAGNOSIS — F1220 Cannabis dependence, uncomplicated: Secondary | ICD-10-CM | POA: Diagnosis not present

## 2015-07-26 DIAGNOSIS — S0193XA Puncture wound without foreign body of unspecified part of head, initial encounter: Secondary | ICD-10-CM

## 2015-07-26 DIAGNOSIS — F4310 Post-traumatic stress disorder, unspecified: Secondary | ICD-10-CM | POA: Diagnosis present

## 2015-07-26 DIAGNOSIS — F1010 Alcohol abuse, uncomplicated: Secondary | ICD-10-CM

## 2015-07-26 DIAGNOSIS — W3400XA Accidental discharge from unspecified firearms or gun, initial encounter: Secondary | ICD-10-CM

## 2015-07-26 DIAGNOSIS — Y9289 Other specified places as the place of occurrence of the external cause: Secondary | ICD-10-CM | POA: Diagnosis not present

## 2015-07-26 DIAGNOSIS — G4700 Insomnia, unspecified: Secondary | ICD-10-CM | POA: Diagnosis present

## 2015-07-26 DIAGNOSIS — X749XXA Intentional self-harm by unspecified firearm discharge, initial encounter: Secondary | ICD-10-CM | POA: Diagnosis not present

## 2015-07-26 DIAGNOSIS — E221 Hyperprolactinemia: Secondary | ICD-10-CM | POA: Clinically undetermined

## 2015-07-26 DIAGNOSIS — F17200 Nicotine dependence, unspecified, uncomplicated: Secondary | ICD-10-CM | POA: Diagnosis present

## 2015-07-26 MED ORDER — HYDROXYZINE HCL 25 MG PO TABS
25.0000 mg | ORAL_TABLET | Freq: Two times a day (BID) | ORAL | Status: DC
  Administered 2015-07-26 – 2015-07-27 (×2): 25 mg via ORAL
  Filled 2015-07-26 (×7): qty 1

## 2015-07-26 MED ORDER — OLANZAPINE 10 MG PO TABS: 10.0000 mg | ORAL_TABLET | Freq: Two times a day (BID) | ORAL | Status: DC

## 2015-07-26 MED ORDER — POLYETHYLENE GLYCOL 3350 17 G PO PACK
17.0000 g | PACK | Freq: Every day | ORAL | Status: DC
  Administered 2015-07-27 – 2015-08-02 (×6): 17 g via ORAL
  Filled 2015-07-26 (×11): qty 1

## 2015-07-26 MED ORDER — HYDROXYZINE PAMOATE 25 MG PO CAPS: 25.0000 mg | ORAL_CAPSULE | Freq: Two times a day (BID) | ORAL | Status: DC

## 2015-07-26 MED ORDER — MAGNESIUM HYDROXIDE 400 MG/5ML PO SUSP: 30.0000 mL | Freq: Every day | ORAL | Status: DC | PRN

## 2015-07-26 MED ORDER — HYDROXYZINE HCL 25 MG PO TABS: 25.0000 mg | ORAL_TABLET | Freq: Three times a day (TID) | ORAL | Status: DC | PRN

## 2015-07-26 MED ORDER — BUSPIRONE HCL 5 MG PO TABS
5.0000 mg | ORAL_TABLET | Freq: Two times a day (BID) | ORAL | Status: DC
  Administered 2015-07-26 – 2015-08-02 (×14): 5 mg via ORAL
  Filled 2015-07-26 (×4): qty 1
  Filled 2015-07-26: qty 14
  Filled 2015-07-26 (×4): qty 1
  Filled 2015-07-26: qty 14
  Filled 2015-07-26 (×9): qty 1

## 2015-07-26 MED ORDER — ACETAMINOPHEN 325 MG PO TABS
650.0000 mg | ORAL_TABLET | Freq: Four times a day (QID) | ORAL | Status: DC | PRN
  Administered 2015-07-26 – 2015-07-29 (×3): 650 mg via ORAL
  Filled 2015-07-26 (×3): qty 2

## 2015-07-26 MED ORDER — ALUM & MAG HYDROXIDE-SIMETH 200-200-20 MG/5ML PO SUSP: 30.0000 mL | ORAL | Status: DC | PRN

## 2015-07-26 MED ORDER — OLANZAPINE 10 MG PO TABS
10.0000 mg | ORAL_TABLET | Freq: Every day | ORAL | Status: DC
  Administered 2015-07-26: 10 mg via ORAL
  Filled 2015-07-26 (×4): qty 1

## 2015-07-26 NOTE — Progress Notes (Signed)
Discharge orders received, Pt for discharge to behavorial health. IV d/c'd. [employer] company assited the patient downstairs. 1709 07/26/15

## 2015-07-26 NOTE — Progress Notes (Signed)
Still awaiting placement. No change in status.

## 2015-07-26 NOTE — [facility] Group Notes (Signed)
Pt attended karaoke and socialized with her peers.  Ashaz Robling, MHT 

## 2015-07-26 NOTE — Tx Team (Signed)
Initial Interdisciplinary Treatment Plan   PATIENT STRESSORS: Medication change or noncompliance Substance abuse   PATIENT STRENGTHS: Ability for insight Average or above average intelligence   PROBLEM LIST: Problem List/Patient Goals Date to be addressed Date deferred Reason deferred Estimated date of resolution  "I've been off medications" 07/26/2015     "Get an apartment"" 07/26/2015     "become stable" 07/26/2015     Suicide attempt 07/26/2015     Substance Abuse 07/26/2015                              DISCHARGE CRITERIA:  Ability to meet basic life and health needs Adequate post-discharge living arrangements Improved stabilization in mood, thinking, and/or behavior  PRELIMINARY DISCHARGE PLAN: Placement in alternative living arrangements  PATIENT/FAMIILY INVOLVEMENT: This treatment plan has been presented to and reviewed with the patient, Cindra PresumeJennifer D Thomley, a Dara Hoyershley N Stina Gane 07/26/2015, 7:43 PM
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
## 2015-07-26 NOTE — Clinical Social Work Psych Note (Addendum)
1:27pm- [facility] Report to be called to: [phone] Transportation: [employer] [phone]- to be scheduled at 3:30pm Patient is voluntary at this time- form signed   11:57am- - Psych CSW attempted to contact patient's boyfriend Dustin.  Psych CSW left message (#3) for Dustin. Psych CSW continues to await a return call.  Of report, collaterals have been collected from patient's mother (please see full assessment).    Vickii PennaGina Delayza Lungren, LCSW [phone]  Hospital Psychiatric [facility] 2S Licensed Clinical Social Worker

## 2015-07-27 ENCOUNTER — Encounter (HOSPITAL_COMMUNITY): Payer: Self-pay | Admitting: Psychiatry

## 2015-07-27 DIAGNOSIS — F3163 Bipolar disorder, current episode mixed, severe, without psychotic features: Secondary | ICD-10-CM | POA: Diagnosis present

## 2015-07-27 DIAGNOSIS — F1420 Cocaine dependence, uncomplicated: Secondary | ICD-10-CM

## 2015-07-27 DIAGNOSIS — F1020 Alcohol dependence, uncomplicated: Secondary | ICD-10-CM | POA: Diagnosis present

## 2015-07-27 DIAGNOSIS — F1220 Cannabis dependence, uncomplicated: Secondary | ICD-10-CM

## 2015-07-27 MED ORDER — PHENYLEPHRINE IN HARD FAT 0.25 % RE SUPP
1.0000 | Freq: Two times a day (BID) | RECTAL | Status: DC
  Administered 2015-07-27 – 2015-08-02 (×9): 1 via RECTAL
  Filled 2015-07-27 (×16): qty 1

## 2015-07-27 MED ORDER — BIOTENE DRY MOUTH MT LIQD
15.0000 mL | OROMUCOSAL | Status: DC | PRN
  Administered 2015-07-29 – 2015-08-01 (×4): 15 mL via OROMUCOSAL
  Filled 2015-07-27 (×5): qty 15

## 2015-07-27 MED ORDER — ZIPRASIDONE MESYLATE 20 MG IM SOLR: 20.0000 mg | Freq: Four times a day (QID) | INTRAMUSCULAR | Status: DC | PRN

## 2015-07-27 MED ORDER — HYDROXYZINE HCL 25 MG PO TABS
25.0000 mg | ORAL_TABLET | Freq: Four times a day (QID) | ORAL | Status: DC | PRN
  Administered 2015-07-27 – 2015-07-29 (×3): 25 mg via ORAL
  Filled 2015-07-27 (×3): qty 1

## 2015-07-27 MED ORDER — BACITRACIN ZINC 500 UNIT/GM EX OINT
TOPICAL_OINTMENT | Freq: Two times a day (BID) | CUTANEOUS | Status: DC
  Administered 2015-07-27 – 2015-07-29 (×3): via TOPICAL
  Filled 2015-07-27: qty 28.35

## 2015-07-27 MED ORDER — LAMOTRIGINE 25 MG PO TABS
25.0000 mg | ORAL_TABLET | Freq: Every day | ORAL | Status: DC
  Administered 2015-07-27 – 2015-07-28 (×2): 25 mg via ORAL
  Filled 2015-07-27 (×5): qty 1

## 2015-07-27 MED ORDER — GABAPENTIN 100 MG PO CAPS
100.0000 mg | ORAL_CAPSULE | Freq: Three times a day (TID) | ORAL | Status: DC
  Administered 2015-07-27 – 2015-07-30 (×8): 100 mg via ORAL
  Filled 2015-07-27 (×13): qty 1

## 2015-07-27 MED ORDER — TRAZODONE HCL 50 MG PO TABS
50.0000 mg | ORAL_TABLET | Freq: Every evening | ORAL | Status: DC | PRN
  Filled 2015-07-27: qty 1

## 2015-07-27 MED ORDER — NICOTINE 14 MG/24HR TD PT24
14.0000 mg | MEDICATED_PATCH | Freq: Every day | TRANSDERMAL | Status: DC
  Administered 2015-07-27 – 2015-08-01 (×6): 14 mg via TRANSDERMAL
  Filled 2015-07-27 (×10): qty 1

## 2015-07-27 MED ORDER — BIOTENE DRY MOUTH MT LIQD: 15.0000 mL | OROMUCOSAL | Status: DC | PRN

## 2015-07-27 MED ORDER — OLANZAPINE 10 MG PO TABS
20.0000 mg | ORAL_TABLET | Freq: Every day | ORAL | Status: DC
  Administered 2015-07-27: 20 mg via ORAL
  Filled 2015-07-27 (×4): qty 2

## 2015-07-27 MED ORDER — ALBUTEROL SULFATE HFA 108 (90 BASE) MCG/ACT IN AERS
2.0000 | INHALATION_SPRAY | RESPIRATORY_TRACT | Status: DC | PRN
  Administered 2015-07-28 – 2015-08-02 (×3): 2 via RESPIRATORY_TRACT
  Filled 2015-07-27: qty 6.7

## 2015-07-27 MED ORDER — ZIPRASIDONE HCL 20 MG PO CAPS: 20.0000 mg | ORAL_CAPSULE | Freq: Four times a day (QID) | ORAL | Status: DC | PRN

## 2015-07-27 MED ORDER — IBUPROFEN 600 MG PO TABS
600.0000 mg | ORAL_TABLET | Freq: Four times a day (QID) | ORAL | Status: DC | PRN
  Administered 2015-07-27 – 2015-08-02 (×10): 600 mg via ORAL
  Filled 2015-07-27 (×9): qty 1

## 2015-07-27 MED ORDER — FLUCONAZOLE 150 MG PO TABS
150.0000 mg | ORAL_TABLET | Freq: Once | ORAL | Status: AC
  Administered 2015-07-27: 150 mg via ORAL
  Filled 2015-07-27: qty 1
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
## 2015-07-27 NOTE — SANE Note (Signed)
Chart requested by Det. Shackleford from [facility]. Chart sent. 

## 2015-07-27 NOTE — Progress Notes (Signed)
D:Pt has been in her bed this morning. Pt was loud and irritable as Clinical research associatewriter entered her room saying that she was not going to get up and take her medications and requested that her medications be brought to her and c/o of a headache. When writer went to prepare medications, pt came to nurses medication window. Pt is labile and impulsive becoming more agitated when asked about si and hi.  A:Gave medications as ordered. Offered support and 15 minute checks. R:Pt denies si and hi. Pt refuses to have skin assessed for knee abrasion and head at this time. Safety maintained on the unit.
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
## 2015-07-27 NOTE — [facility] Group Notes (Signed)
[facility]   07/27/2015 9:58 AM  Participation Quality: Invited. Chose not to attend.   Jeanne JordanLynn B Orson Rho

## 2015-07-27 NOTE — Plan of Care (Signed)
Problem: Ineffective individual coping Goal: STG: Pt will be able to identify effective and ineffective STG: Pt will be able to identify effective and ineffective coping patterns  Outcome: Not Progressing Pt's mood is labile and irritable. She refuses to get out of bed this morning or attend group.

## 2015-07-27 NOTE — [facility] Group Notes (Signed)
[facility] LCSW Group Therapy  07/27/2015  1:05 PM  Type of Therapy:  Group therapy  Participation Level:  Active  Participation Quality:  Attentive  Affect:  Flat  Cognitive:  Oriented  Insight:  Limited  Engagement in Therapy:  Limited  Modes of Intervention:  Discussion, Socialization  Summary of Progress/Problems:  Chaplain was here to lead a group on themes of hope and courage. Came late.  Stayed for 10 minutes.  Left and did not return. Daryel Geraldorth, Ricki Vanhandel B 07/27/2015 1:37 PM

## 2015-07-28 DIAGNOSIS — Y9289 Other specified places as the place of occurrence of the external cause: Secondary | ICD-10-CM

## 2015-07-28 DIAGNOSIS — S0181XA Laceration without foreign body of other part of head, initial encounter: Secondary | ICD-10-CM

## 2015-07-28 DIAGNOSIS — X749XXA Intentional self-harm by unspecified firearm discharge, initial encounter: Secondary | ICD-10-CM

## 2015-07-28 DIAGNOSIS — T1491 Suicide attempt: Secondary | ICD-10-CM

## 2015-07-28 LAB — TSH
TSH: 7.691 u[IU]/mL — ABNORMAL HIGH (ref 0.350–4.500)
TSH: 3.219 u[IU]/mL (ref 0.350–4.500)

## 2015-07-28 MED ORDER — OLANZAPINE 10 MG PO TABS
10.0000 mg | ORAL_TABLET | Freq: Once | ORAL | Status: AC
  Administered 2015-07-28: 10 mg via ORAL
  Filled 2015-07-28 (×2): qty 1

## 2015-07-28 MED ORDER — LAMOTRIGINE 25 MG PO TABS
25.0000 mg | ORAL_TABLET | Freq: Two times a day (BID) | ORAL | Status: DC
  Administered 2015-07-28 – 2015-08-02 (×10): 25 mg via ORAL
  Filled 2015-07-28 (×12): qty 1
  Filled 2015-07-28: qty 14
  Filled 2015-07-28: qty 1
  Filled 2015-07-28: qty 14

## 2015-07-28 NOTE — Progress Notes (Signed)
D: Patient observed in dayroom with peers watching television. Patient voiced concern that she feels her Zyprexa may be causing her to be sleepy in the morning. She also communicated she wanted to take Zyprexa 10 mg instead of Zyprexa 20 mg which she was ordered to see if it was causing her to be so sleepy in the morning. Stated she is too sleepy to get up for group and also to speak with doctor when they come to speak with her.  A: Patient administered a one time dose of Zyprexa 10 mg for tonight and instructed to speak with doctor in the morning for medication adjustment and further evaluate her being sleepy.  R: Patient happy with one time order and stated she will speak with doctor tomorrow morning. Q 15 minute checks maintained and in progress and monitoring of patient continues.

## 2015-07-28 NOTE — [facility] Group Notes (Signed)
[facility] Group Notes: (Clinical Social Work)   07/28/2015      Type of Therapy:  Group Therapy   Participation Level:  Did Not Attend despite MHT prompting   Britt Theard Grossman-Orr, LCSW 07/28/2015, 12:13 PM     

## 2015-07-28 NOTE — Progress Notes (Signed)
DAR NOTE: Patient was very irritable and angry on approach.  Presents with anxious mood and affect.  Denies  auditory and visual hallucinations.  Patient fixated on getting discharged.  Demanding to see the doctor after being seen.  Rates depression at 5/10, hopelessness at 4/10, and anxiety at 0/10.  Complain of headache and generalized discomfort.  Requested and received motrin for pain with good effect.  Maintained on routine safety checks.  Medications given as prescribed.  Support and encouragement offered as needed.  Patient invited to groups but refused each time.  States goal for today is "talking with the doctor about discharge."  Patient observed socializing with peers in the dayroom.
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
## 2015-07-28 NOTE — Progress Notes (Signed)
   D: Pt questioned the dosages of her medications. Asking the writer if they were "normal doses". Explained to the pt that doses vary based on the individual needs. Pt also remarked that she "hopes the  Dr knows what shes doing"the patient voiced no other questions or concerns.   A:  Support and encouragement was offered. 15 min checks continued for safety.  R: Pt remains safe.

## 2015-07-29 LAB — PROLACTIN: PROLACTIN: 71.3 ng/mL — AB (ref 4.8–23.3)

## 2015-07-29 MED ORDER — ARIPIPRAZOLE 5 MG PO TABS
5.0000 mg | ORAL_TABLET | Freq: Every day | ORAL | Status: DC
  Administered 2015-07-29 – 2015-07-30 (×2): 5 mg via ORAL
  Filled 2015-07-29 (×5): qty 1

## 2015-07-29 MED ORDER — PANTOPRAZOLE SODIUM 40 MG PO TBEC
40.0000 mg | DELAYED_RELEASE_TABLET | Freq: Every day | ORAL | Status: DC
  Administered 2015-07-29 – 2015-08-02 (×4): 40 mg via ORAL
  Filled 2015-07-29 (×8): qty 1

## 2015-07-29 MED ORDER — OLANZAPINE 10 MG PO TABS
10.0000 mg | ORAL_TABLET | Freq: Every day | ORAL | Status: DC
  Administered 2015-07-29: 10 mg via ORAL
  Filled 2015-07-29 (×3): qty 1

## 2015-07-29 MED ORDER — FLUTICASONE PROPIONATE 50 MCG/ACT NA SUSP
1.0000 | Freq: Two times a day (BID) | NASAL | Status: DC | PRN
Start: 2015-07-29 — End: 2015-08-02
  Administered 2015-07-29 – 2015-08-02 (×7): 1 via NASAL
  Filled 2015-07-29: qty 16

## 2015-07-29 NOTE — Progress Notes (Signed)
Adult Psychoeducational Group Note  Date:  07/29/2015 Time:  8:37 PM  Group Topic/Focus:  Wrap-Up Group:   The focus of this group is to help patients review their daily goal of treatment and discuss progress on daily workbooks.  Participation Level:  Active  Participation Quality:  Appropriate  Affect:  Appropriate  Cognitive:  Appropriate  Insight: Appropriate  Engagement in Group:  Engaged  Modes of Intervention:  Discussion  Additional Comments: The patient expressed that she attended group.The patient also said that she had a good day and rates it a 10.  Octavio Mannshigpen, Gianpaolo Mindel Lee 07/29/2015, 8:37 PM

## 2015-07-29 NOTE — [facility] Group Notes (Signed)
[facility] Group Notes:  (Clinical Social Work)  07/29/2015  [facility] Group Notes:  (Clinical Social Work)  07/29/2015  11:00AM-12:00PM  Summary of Progress/Problems:  The main focus of today's process group was to listen to a variety of genres of music and to identify that different types of music provoke different responses.  The patient then was able to identify personally what was soothing for them, as well as energizing.  The patient expressed understanding of concepts, as well as knowledge of how each type of music affected her and how this can be used at home as a wellness/recovery tool.  She sang along with much of the music, and seemed to relax and enjoy it.  Type of Therapy:  Music Therapy   Participation Level:  Active  Participation Quality:  Attentive and Sharing  Affect:  Blunted  Cognitive:  Oriented  Insight:  Engaged  Engagement in Therapy:  Engaged  Modes of Intervention:   Activity, Exploration  Jeanne MantleMareida Grossman-Orr, LCSW 07/29/2015

## 2015-07-29 NOTE — Progress Notes (Signed)
D: Pt presents anxious on approach. Pt presents with rapid, pressured speech and flight of ideas. Pt child-like and silly. Pt intrusive and impulsive during the day. Pt denies suicidal thoughts. Pt rates depression 5/10 and hopeless 6/10. Pt denies AVH. Pt compliant with taking meds today. Pt started on Abilify today and no adverse reaction to meds verbalized.  A: Medications administered as ordered per MD. Verbal support given. Pt encouraged to attend groups. 15 minute checks performed for safety.  R: Pt receptive to tx.
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
## 2015-07-29 NOTE — Plan of Care (Signed)
Problem: Alteration in mood & ability to function due to Goal: LTG-Pt reports reduction in suicidal thoughts (Patient reports reduction in suicidal thoughts and is able to verbalize a safety plan for whenever patient is feeling suicidal)  Outcome: Progressing Patient currently denies SI and HI, and AVH patient contracts for safety while here. Monitoring continues.

## 2015-07-29 NOTE — Progress Notes (Signed)
D   Pt has been talking on the phone a lot   She is loud and has pressured speech   She is very  Interactive on the unit needing some redirection at times    She is intrusive but does redirect easily   Pt reports she is ready for discharge but doesn't have anywhere to go A   Verbal support given   Medications administered and effectiveness monitored   Q 15 min checks   Redirect and educate as needed  R    Pt safe at present and receptive to verbal support

## 2015-07-30 DIAGNOSIS — E221 Hyperprolactinemia: Secondary | ICD-10-CM | POA: Clinically undetermined

## 2015-07-30 LAB — HEMOGLOBIN A1C
Hgb A1c MFr Bld: 4.9 % (ref 4.8–5.6)
Mean Plasma Glucose: 94 mg/dL

## 2015-07-30 MED ORDER — NICOTINE POLACRILEX 2 MG MT GUM
CHEWING_GUM | OROMUCOSAL | Status: AC
  Administered 2015-07-30: 22:00:00
  Filled 2015-07-30: qty 1

## 2015-07-30 MED ORDER — GABAPENTIN 100 MG PO CAPS
200.0000 mg | ORAL_CAPSULE | Freq: Three times a day (TID) | ORAL | Status: DC
  Administered 2015-07-30 – 2015-08-01 (×6): 200 mg via ORAL
  Filled 2015-07-30 (×9): qty 2

## 2015-07-30 MED ORDER — BACITRACIN-NEOMYCIN-POLYMYXIN OINTMENT TUBE
TOPICAL_OINTMENT | Freq: Two times a day (BID) | CUTANEOUS | Status: DC
  Administered 2015-07-30: 1 via TOPICAL
  Filled 2015-07-30: qty 15

## 2015-07-30 MED ORDER — OLANZAPINE 5 MG PO TABS
5.0000 mg | ORAL_TABLET | Freq: Every day | ORAL | Status: DC
  Administered 2015-07-30: 5 mg via ORAL
  Filled 2015-07-30 (×2): qty 1

## 2015-07-30 MED ORDER — GABAPENTIN 100 MG PO CAPS: 200.0000 mg | ORAL_CAPSULE | Freq: Three times a day (TID) | ORAL | Status: DC

## 2015-07-30 MED ORDER — ARIPIPRAZOLE 10 MG PO TABS
10.0000 mg | ORAL_TABLET | Freq: Every day | ORAL | Status: DC
  Filled 2015-07-30: qty 1

## 2015-07-30 NOTE — [facility] Group Notes (Signed)
[facility]   07/30/2015 10:52 AM  Participation Quality:  Invited.  Chose to not attend.  "I'll catch the next one."    Kiribatiorth, [name]

## 2015-07-30 NOTE — Progress Notes (Signed)
DAR NOTE: Patient was very irritable and angry when approached by staff for medication administration.  Patient states, "I will take it later."  Denies pain, auditory and visual hallucinations.  Rates depression at  4/10, hopelessness at 7/10, and anxiety at 5/10.  Maintained on routine safety checks.  Medications given as prescribed.  Support and encouragement offered as needed.  Attended group and participated.  States goal for today is "attending groups,getting medications right."  Patient observed socializing with peers in the dayroom.

## 2015-07-30 NOTE — Progress Notes (Signed)
D    Pt is pleasant and cooperative   She is much calmer and less intrusive and irritable   She has maintained control of her behavior even when limits are set    She tends to think concretely and needs thorough explanation     She interacts well with her peers and attends group A   Verbal support given   Medications administered and effectiveness monitored   Q 15 min checks R   Pt safe at present and receptive to verbal support
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
## 2015-07-30 NOTE — Plan of Care (Signed)
Problem: Ineffective individual coping Goal: LTG: Patient will report a decrease in negative feelings Outcome: Progressing Pt is making more positive statements on a regular basis Goal: STG: Patient will remain free from self harm Outcome: Progressing Pt has not harmed herself since hospitalization  Problem: Alteration in mood & ability to function due to Goal: LTG-Pt reports reduction in suicidal thoughts (Patient reports reduction in suicidal thoughts and is able to verbalize a safety plan for whenever patient is feeling suicidal)  Outcome: Progressing Pt denies any suicidal ideation  Goal: LTG-Patient demonstrates decreased signs of withdrawal (Patient demonstrates decreased signs of withdrawal to the point the patient is safe to return home and continue treatment in an outpatient setting)  Outcome: Progressing Pt denies withdrawal symptoms  Goal: LTG-Pt verbalizes understanding of importance of med regimen (Patient verbalizes understanding of importance of medication regimen and need to continue outpatient care and support groups)  Pt takes her medication and expresses understanding of theraputic use Goal: STG-Patient will attend groups Outcome: Progressing Pt attends most of her groups Goal: STG-Patient will comply with prescribed medication regimen (Patient will comply with prescribed medication regimen)  Outcome: Progressing Pt takes medications without problem

## 2015-07-31 MED ORDER — ARIPIPRAZOLE 15 MG PO TABS
15.0000 mg | ORAL_TABLET | Freq: Every day | ORAL | Status: DC
  Administered 2015-07-31: 15 mg via ORAL
  Filled 2015-07-31 (×2): qty 1

## 2015-07-31 NOTE — [facility] Group Notes (Signed)
[facility] Group Notes:  (Nursing/MHT/Case Management/Adjunct)  Date:  07/31/2015  Time:  10:45 AM  Type of Therapy:  Nurse Education  Participation Level:  Active  Participation Quality:  Attentive and Monopolizing  Affect:  Labile  Cognitive:  Alert and Oriented  Insight:  Improving  Engagement in Group:  Monopolizing and Supportive  Modes of Intervention:  Discussion and Education  Summary of Progress/Problems:  Group topic for the day is Recovery.  Discussed goal setting and learning new coping skills.  [name] reports that she is to "try to feel if her medications are working or not." She voiced wanting to go home as soon as possible but wants to make sure that her medications are helping.  Norm ParcelHeather V Kiyah Demartini 07/31/2015, 10:45 AM

## 2015-07-31 NOTE — [facility] Group Notes (Signed)
[facility] LCSW Group Therapy  07/31/2015 , 1:04 PM   Type of Therapy:  Group Therapy  Participation Level:  Active  Participation Quality:  Attentive  Affect:  Appropriate  Cognitive:  Alert  Insight:  Improving  Engagement in Therapy:  Engaged  Modes of Intervention:  Discussion, Exploration and Socialization  Summary of Progress/Problems: Today's group focused on the term Diagnosis.  Participants were asked to define the term, and then pronounce whether it is a negative, positive or neutral term.  Stayed the entire time.  Appeared disengaged.  Frequently looking out the window.  Identified her ACT team as her only support, but appreciates all they do for her.  Jeanne Geraldorth, [name] 07/31/2015 , 1:04 PM

## 2015-07-31 NOTE — [facility] Suicide Risk Assessment (Signed)
[facility] INPATIENT:  Family/Significant Other Suicide Prevention Education  Suicide Prevention Education:  Patient Refusal for Family/Significant Other Suicide Prevention Education: The patient [name] has refused to provide written consent for family/significant other to be provided Family/Significant Other Suicide Prevention Education during admission and/or prior to discharge.  Physician notified.  "You can call Ree KidaJack.  He's my ACT team worker."  Daryel Geraldorth, Tiearra Colwell B 07/31/2015, 2:57 PM

## 2015-07-31 NOTE — [facility] Group Notes (Signed)
Adult Psychoeducational Group Note  Date:  07/31/2015 Time:  9:14 PM  Group Topic/Focus:  Wrap-Up Group:   The focus of this group is to help patients review their daily goal of treatment and discuss progress on daily workbooks.  Participation Level:  Active  Participation Quality:  Attentive  Affect:  Flat  Cognitive:  Appropriate  Insight: Appropriate  Engagement in Group:  Engaged  Modes of Intervention:  Discussion  Additional Comments:  Patient stated her day was pretty good.  She went to all her groups and stayed out of the bed.  Her goal was to attend all groups.  Patient expressed she was happy she went to the groups because she learned a lot.  Patient is expecting to discharge in a couple of days.  Caroll RancherLindsay, Omeka Holben A 07/31/2015, 9:14 PM
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
## 2015-07-31 NOTE — Plan of Care (Signed)
Problem: Alteration in mood & ability to function due to Goal: LTG-Pt reports reduction in suicidal thoughts (Patient reports reduction in suicidal thoughts and is able to verbalize a safety plan for whenever patient is feeling suicidal)  Outcome: Progressing She has voiced no suicidal thoughts today.  "I am just ready to go home."

## 2015-07-31 NOTE — Progress Notes (Signed)
DAR Note: [name] has been visible on the unit.  Attending groups with minimal participation.  She would answer questions appropriately.  She denies any SI/HI or A/V hallucinations.  She reported that she is ready to go home and was hoping to leave today.  She asked multiple questions about her medications.  She is worried that the medication isn't working and reminded her that it could take a few days to really notice a change.  She stated that her goal was "leaving" today but she informed writer that the doctor wasn't going to let her leave today.  "I just want to make sure my medications are helping so I can leave."  She denies any pain and appears to be in no physical distress.  Q 15 minute checks maintained for safety.  Encouraged her to participate in group and unit activities.

## 2015-08-01 MED ORDER — ZINC OXIDE 20 % EX OINT
TOPICAL_OINTMENT | CUTANEOUS | Status: DC | PRN
  Filled 2015-08-01 (×2): qty 28.35

## 2015-08-01 MED ORDER — ARIPIPRAZOLE 10 MG PO TABS
20.0000 mg | ORAL_TABLET | Freq: Every day | ORAL | Status: DC
  Administered 2015-08-01: 20 mg via ORAL
  Filled 2015-08-01 (×2): qty 2
  Filled 2015-08-01: qty 14

## 2015-08-01 MED ORDER — ARIPIPRAZOLE ER 400 MG IM SUSR
400.0000 mg | INTRAMUSCULAR | Status: DC
  Administered 2015-08-02: 400 mg via INTRAMUSCULAR

## 2015-08-01 MED ORDER — TRAZODONE HCL 50 MG PO TABS
50.0000 mg | ORAL_TABLET | Freq: Every day | ORAL | Status: DC
  Administered 2015-08-01: 50 mg via ORAL
  Filled 2015-08-01: qty 1
  Filled 2015-08-01: qty 7
  Filled 2015-08-01: qty 1

## 2015-08-01 MED ORDER — GABAPENTIN 100 MG PO CAPS
200.0000 mg | ORAL_CAPSULE | ORAL | Status: DC
  Administered 2015-08-01 – 2015-08-02 (×3): 200 mg via ORAL
  Filled 2015-08-01 (×2): qty 2
  Filled 2015-08-01 (×2): qty 42
  Filled 2015-08-01 (×3): qty 2
  Filled 2015-08-01: qty 42
  Filled 2015-08-01: qty 2

## 2015-08-01 MED ORDER — ZINC OXIDE 12.8 % EX OINT
TOPICAL_OINTMENT | CUTANEOUS | Status: DC | PRN
  Filled 2015-08-01: qty 56.7

## 2015-08-01 MED ORDER — NICOTINE POLACRILEX 2 MG MT GUM
2.0000 mg | CHEWING_GUM | OROMUCOSAL | Status: DC | PRN
  Administered 2015-08-01: 2 mg via ORAL
  Filled 2015-08-01: qty 1
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
## 2015-08-01 NOTE — Progress Notes (Addendum)
DAR Note: Jeanne DikeJennifer has been visible on the unit.  In the day room interacting with peers.  She is intrusive and needed to be redirected from asking questions regarding her peers.  She denies any SI/HI or A/V hallucinations.  She reports sleeping good.  She denies any pain and appears to be in no physical distress.  She reports that she is ready to go home.  She talked about wanting to have testing for STD's and encouraged her to talk with the doctor about getting the orders.  She completed her self inventory and reported her depression 0/10, hopelessness 7/10 and anxiety 0/10.  Her goal for today is "staying positive, thinking positive."  Q 15 minute checks maintained for safety.  Encouraged continued participation in group and unit activities.

## 2015-08-01 NOTE — Progress Notes (Signed)
Adult Psychoeducational Group Note  Date:  08/01/2015 Time:  8:37 PM  Group Topic/Focus:  Wrap-Up Group:   The focus of this group is to help patients review their daily goal of treatment and discuss progress on daily workbooks.  Participation Level:  Active  Participation Quality:  Appropriate  Affect:  Appropriate  Cognitive:  Appropriate  Insight: Appropriate  Engagement in Group:  Engaged  Modes of Intervention:  Discussion  Additional Comments: The patient expressed that she attended group.The patient also said that she is looking forward to discharge  Octavio Mannshigpen, Jeston Junkins Lee 08/01/2015, 8:37 PM

## 2015-08-01 NOTE — [facility] Group Notes (Signed)
[facility] LCSW Group Therapy  08/01/2015 3:04 PM  Type of Therapy: Group Therapy  Participation Level: Active  Participation Quality: Attentive  Affect: Flat  Cognitive: Oriented  Insight: Limited  Engagement in Therapy: Engaged  Modes of Intervention: Discussion and Socialization  Summary of Progress/Problems: [name] from [employer] was here to tell his story of recovery and play his guitar. Pt was pleasant and alert in group. Pt asked several questions about the speaker's story and seemed to be genuinely interested in his responses.   [name] 08/01/2015 3:04 PM

## 2015-08-01 NOTE — Plan of Care (Signed)
Problem: Alteration in mood & ability to function due to Goal: LTG-Pt reports reduction in suicidal thoughts (Patient reports reduction in suicidal thoughts and is able to verbalize a safety plan for whenever patient is feeling suicidal)  Outcome: Progressing [name] denies any suicidal thoughts.  "I am ready to get out."
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
## 2015-08-01 NOTE — [facility] Group Notes (Signed)
[facility]   08/01/2015 10:09 AM  Participation Quality:  Active  Mood/Affect:  Appropriate  Depression Rating:  Pt denies   Anxiety Rating:  Pt denies   Thoughts of Suicide:  No Will you contract for safety?   NA  Current AVH:  No  Plan for Discharge/Comments:  Pt currently denies all symptoms and reports that she is feeling much better and is "ready to get out of here". Pt spoke about her daughter's birthday coming up and needing to find a job so that she could have enough money to get her a present. Upon discharge pt will be going to stay with a friend with whom she describes as sober and responsible. CSW will follow up with referral to [facility] for help with disability application per pt request.  Transportation Means: ACT team will transport.  Supports: Mental health providers, family, friends   Jonathon JordanLynn B Bryant

## 2015-08-01 NOTE — Progress Notes (Signed)
  [facility] :  Will you be returning to the same living situation after discharge:  No. Going to stay with friend Bernette RedbirdKenny At discharge, do you have transportation home?: Yes,  ACT team Do you have the ability to pay for your medications: Yes,  ACT team  Release of information consent forms completed and in the chart;  Patient's signature needed at discharge.  Patient to Follow up at: Follow-up Information    Follow up with [facility].   Why:  Ree KidaJack or his designee will pick you up at 1:00 the day of d/c.  Jack's number is [phone]   Contact information:   [address]  [place]  [[phone]       Patient denies SI/HI: Yes,  yes    Safety Planning and Suicide Prevention discussed: Yes,  yes  Have you used any form of tobacco in the last 30 days? (Cigarettes, Smokeless Tobacco, Cigars, and/or Pipes): Yes  Has patient been referred to the Quitline?: Patient refused referral  Ida Rogueorth, Zeta Bucy B 08/01/2015, 10:33 AM

## 2015-08-02 ENCOUNTER — Encounter (HOSPITAL_COMMUNITY): Payer: Self-pay | Admitting: Registered Nurse

## 2015-08-02 MED ORDER — TRAZODONE HCL 50 MG PO TABS
50.0000 mg | ORAL_TABLET | Freq: Every evening | ORAL | Status: DC | PRN
  Filled 2015-08-02: qty 7

## 2015-08-02 MED ORDER — GABAPENTIN 100 MG PO CAPS: 200.0000 mg | ORAL_CAPSULE | ORAL | Status: DC

## 2015-08-02 MED ORDER — PANTOPRAZOLE SODIUM 40 MG PO TBEC: 40.0000 mg | DELAYED_RELEASE_TABLET | Freq: Every day | ORAL | Status: DC

## 2015-08-02 MED ORDER — HYDROXYZINE HCL 25 MG PO TABS
25.0000 mg | ORAL_TABLET | Freq: Two times a day (BID) | ORAL | Status: DC
  Filled 2015-08-02 (×4): qty 14

## 2015-08-02 MED ORDER — ARIPIPRAZOLE ER 400 MG IM SUSR: 400.0000 mg | INTRAMUSCULAR | Status: DC

## 2015-08-02 MED ORDER — BIOTENE DRY MOUTH MT LIQD: 15.0000 mL | OROMUCOSAL | Status: DC | PRN

## 2015-08-02 MED ORDER — TRAZODONE HCL 50 MG PO TABS: 50.0000 mg | ORAL_TABLET | Freq: Every evening | ORAL | Status: DC | PRN

## 2015-08-02 MED ORDER — LAMOTRIGINE 25 MG PO TABS: 25.0000 mg | ORAL_TABLET | Freq: Two times a day (BID) | ORAL | Status: DC

## 2015-08-02 MED ORDER — ALBUTEROL SULFATE HFA 108 (90 BASE) MCG/ACT IN AERS: 2.0000 | INHALATION_SPRAY | RESPIRATORY_TRACT | Status: DC | PRN

## 2015-08-02 MED ORDER — FLUTICASONE PROPIONATE 50 MCG/ACT NA SUSP: 1.0000 | Freq: Two times a day (BID) | NASAL | Status: DC | PRN

## 2015-08-02 MED ORDER — ARIPIPRAZOLE 20 MG PO TABS: 20.0000 mg | ORAL_TABLET | Freq: Every day | ORAL | Status: DC
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
## 2015-08-02 NOTE — Plan of Care (Signed)
Problem: Diagnosis: Increased Risk For Suicide Attempt Goal: STG-Patient Will Attend All Groups On The Unit Outcome: Progressing Pt attended evening group on 08/01/15
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
## 2015-08-02 NOTE — [facility] Group Notes (Signed)
[facility] Group Notes:  (Nursing/MHT/Case Management/Adjunct)  Date:  08/02/2015  Time:  10:42 AM  Type of Therapy:  Nurse Education  Participation Level:  Did Not Attend  Participation Quality:    Affect:    Cognitive:    Insight:    Engagement in Group:    Modes of Intervention:  Discussion and Education  Summary of Progress/Problems:  Group topic today was Leisure and Lifestyle changes. Discussed the importance in having positive leisure activities to be successful after hospitalization. Discussed setting appropriate goals. She declined coming to group.  Norm ParcelHeather V Diron Haddon 08/02/2015, 10:42 AM

## 2015-08-02 NOTE — Progress Notes (Signed)
D: Pt has appropriate affect and pleasant mood.  She reports her day has "been good" and that she is discharging tomorrow.  Pt reports she feels safe to discharge but that she doesn't know where she'll be going.  She reports she may be going to stay with a friend.  She reports her goal was to "stay up and go to groups" and she accomplished this goal.  Pt denies SI/HI, denies hallucinations, reports pain of 8/10.  Pt has been visible in milieu interacting with peers and staff appropriately.  She was playing cards in the day room with her peers.  Pt attended evening group.   A: Introduced self to pt.  Met with pt 1:1 and provided support and encouragement.  Actively listened to pt.  Medications administered per order.  PRN medication administered for pain. R: Pt is compliant with medications.  Pt verbally contracts for safety.  Will continue to monitor and assess.

## 2015-08-02 NOTE — Progress Notes (Signed)
D/C note: [name] was D/C from the unit to lobby accompanied by [facility] staff.  She was pleasant and cooperative. She voiced no SI/HI or A/V halluciations.  Pt. Denies any pain or discomfort.  She completed her self inventory and reports that her depression is 5/10, hopelessness 3-4/10 and anxiety 5/10.  She reported her goal for today was leaving.  D/C instructions and medications reviewed with pt.  Pt. verbalized understanding of medications and d/c instructions.   All belongings from locker 8 returned to pt. (clothing, pocketbook, shoes) Q 15 min checks maintained until discharge.  Pt. left the unit in no apparent distress.

## 2015-08-13 ENCOUNTER — Emergency Department (HOSPITAL_COMMUNITY): Payer: PRIVATE HEALTH INSURANCE

## 2015-08-13 ENCOUNTER — Emergency Department (HOSPITAL_COMMUNITY)
Admission: EM | Admit: 2015-08-13 | Discharge: 2015-08-13 | Disposition: A | Discharge status: Home / Self Care | Payer: PRIVATE HEALTH INSURANCE | Attending: Emergency Medicine | Admitting: Emergency Medicine

## 2015-08-13 ENCOUNTER — Encounter (HOSPITAL_COMMUNITY): Payer: Self-pay | Admitting: Family Medicine

## 2015-08-13 DIAGNOSIS — F319 Bipolar disorder, unspecified: Secondary | ICD-10-CM | POA: Insufficient documentation

## 2015-08-13 DIAGNOSIS — Y998 Other external cause status: Secondary | ICD-10-CM | POA: Insufficient documentation

## 2015-08-13 DIAGNOSIS — Y9389 Activity, other specified: Secondary | ICD-10-CM | POA: Insufficient documentation

## 2015-08-13 DIAGNOSIS — F419 Anxiety disorder, unspecified: Secondary | ICD-10-CM | POA: Insufficient documentation

## 2015-08-13 DIAGNOSIS — J45909 Unspecified asthma, uncomplicated: Secondary | ICD-10-CM | POA: Insufficient documentation

## 2015-08-13 DIAGNOSIS — S0993XA Unspecified injury of face, initial encounter: Secondary | ICD-10-CM | POA: Insufficient documentation

## 2015-08-13 DIAGNOSIS — Z720 Tobacco use: Secondary | ICD-10-CM | POA: Insufficient documentation

## 2015-08-13 DIAGNOSIS — Y92481 Parking lot as the place of occurrence of the external cause: Secondary | ICD-10-CM | POA: Insufficient documentation

## 2015-08-13 DIAGNOSIS — Z79899 Other long term (current) drug therapy: Secondary | ICD-10-CM | POA: Insufficient documentation

## 2015-08-13 DIAGNOSIS — S99922A Unspecified injury of left foot, initial encounter: Secondary | ICD-10-CM | POA: Insufficient documentation

## 2015-08-13 DIAGNOSIS — N898 Other specified noninflammatory disorders of vagina: Secondary | ICD-10-CM | POA: Insufficient documentation

## 2015-08-13 DIAGNOSIS — F209 Schizophrenia, unspecified: Secondary | ICD-10-CM | POA: Insufficient documentation

## 2015-08-13 DIAGNOSIS — Z3202 Encounter for pregnancy test, result negative: Secondary | ICD-10-CM | POA: Insufficient documentation

## 2015-08-13 DIAGNOSIS — Z7982 Long term (current) use of aspirin: Secondary | ICD-10-CM | POA: Insufficient documentation

## 2015-08-13 LAB — CBC
HCT: 36.6 % (ref 36.0–46.0)
HEMOGLOBIN: 12.6 g/dL (ref 12.0–15.0)
MCH: 30.8 pg (ref 26.0–34.0)
MCHC: 34.4 g/dL (ref 30.0–36.0)
MCV: 89.5 fL (ref 78.0–100.0)
Platelets: 196 10*3/uL (ref 150–400)
RBC: 4.09 MIL/uL (ref 3.87–5.11)
RDW: 13.6 % (ref 11.5–15.5)
WBC: 8.3 10*3/uL (ref 4.0–10.5)

## 2015-08-13 LAB — WET PREP, GENITAL
CLUE CELLS WET PREP: NONE SEEN
Trich, Wet Prep: NONE SEEN
Yeast Wet Prep HPF POC: NONE SEEN

## 2015-08-13 LAB — BASIC METABOLIC PANEL
Anion gap: 11 (ref 5–15)
BUN: 7 mg/dL (ref 6–20)
CHLORIDE: 101 mmol/L (ref 101–111)
CO2: 25 mmol/L (ref 22–32)
CREATININE: 0.54 mg/dL (ref 0.44–1.00)
Calcium: 9 mg/dL (ref 8.9–10.3)
GFR calc Af Amer: >60 mL/min (ref >60)
GFR calc non Af Amer: >60 mL/min (ref >60)
Glucose, Bld: 101 mg/dL — ABNORMAL HIGH (ref 65–99)
Potassium: 3.9 mmol/L (ref 3.5–5.1)
SODIUM: 137 mmol/L (ref 135–145)

## 2015-08-13 LAB — I-STAT BETA HCG BLOOD, ED (MC, WL, AP ONLY)

## 2015-08-13 MED ORDER — KETOROLAC TROMETHAMINE 30 MG/ML IJ SOLN
30.0000 mg | Freq: Once | INTRAMUSCULAR | Status: AC
  Administered 2015-08-13: 30 mg via INTRAVENOUS
  Filled 2015-08-13: qty 1

## 2015-08-13 MED ORDER — ONDANSETRON HCL 4 MG PO TABS
4.0000 mg | ORAL_TABLET | Freq: Once | ORAL | Status: AC
  Administered 2015-08-13: 4 mg via ORAL
  Filled 2015-08-13: qty 1

## 2015-08-13 MED ORDER — AZITHROMYCIN 250 MG PO TABS
1000.0000 mg | ORAL_TABLET | Freq: Once | ORAL | Status: AC
  Administered 2015-08-13: 1000 mg via ORAL
  Filled 2015-08-13: qty 4

## 2015-08-13 MED ORDER — CEFTRIAXONE SODIUM 250 MG IJ SOLR
250.0000 mg | Freq: Once | INTRAMUSCULAR | Status: AC
  Administered 2015-08-13: 250 mg via INTRAMUSCULAR
  Filled 2015-08-13: qty 250

## 2015-08-13 MED ORDER — BACITRACIN ZINC 500 UNIT/GM EX OINT: TOPICAL_OINTMENT | Freq: Two times a day (BID) | CUTANEOUS | Status: DC

## 2015-08-13 NOTE — ED Notes (Signed)
Social work on the phone with pt.
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
## 2015-08-13 NOTE — ED Notes (Signed)
CT called for update on pt transport to [place], states she is next to go.

## 2015-08-13 NOTE — ED Notes (Signed)
Patient transported to CT 

## 2015-08-13 NOTE — ED Notes (Signed)
Pt at nurses station requesting 3 Malawiturkey sandwiches and a coke, informed pt that we could give her 1 happy meal and something to drink.   Pt also at nurses station requesting update, explained to pt that we are waiting for her wet prep results.
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
## 2015-08-13 NOTE — ED Notes (Signed)
Patient undressed, in gown, on continuous pulse oximetry and blood pressure cuff 
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
## 2015-08-13 NOTE — ED Notes (Signed)
Pt in middle of hallway stating "I have to go and get my key from my mamma in the parking lot." explained to pt that she cannot go in the parking lot, if her mother can bring key to nurse first then pt can go and get it .

## 2015-08-14 LAB — GC/CHLAMYDIA PROBE AMP (~~LOC~~) NOT AT ARMC
CHLAMYDIA, DNA PROBE: NEGATIVE
NEISSERIA GONORRHEA: NEGATIVE

## 2015-08-14 LAB — RPR: RPR: NONREACTIVE

## 2015-08-14 LAB — HIV ANTIBODY (ROUTINE TESTING W REFLEX): HIV Screen 4th Generation wRfx: NONREACTIVE

## 2015-10-22 ENCOUNTER — Emergency Department (HOSPITAL_COMMUNITY)
Admission: EM | Admit: 2015-10-22 | Discharge: 2015-10-22 | Disposition: A | Discharge status: Home / Self Care | Payer: PRIVATE HEALTH INSURANCE | Attending: Emergency Medicine | Admitting: Emergency Medicine

## 2015-10-22 ENCOUNTER — Encounter (HOSPITAL_COMMUNITY): Payer: Self-pay | Admitting: *Deleted

## 2015-10-22 DIAGNOSIS — F17210 Nicotine dependence, cigarettes, uncomplicated: Secondary | ICD-10-CM | POA: Insufficient documentation

## 2015-10-22 DIAGNOSIS — J45909 Unspecified asthma, uncomplicated: Secondary | ICD-10-CM | POA: Insufficient documentation

## 2015-10-22 DIAGNOSIS — Z79899 Other long term (current) drug therapy: Secondary | ICD-10-CM | POA: Insufficient documentation

## 2015-10-22 DIAGNOSIS — F319 Bipolar disorder, unspecified: Secondary | ICD-10-CM | POA: Insufficient documentation

## 2015-10-22 DIAGNOSIS — F410 Panic disorder [episodic paroxysmal anxiety] without agoraphobia: Secondary | ICD-10-CM | POA: Insufficient documentation

## 2015-10-22 DIAGNOSIS — F209 Schizophrenia, unspecified: Secondary | ICD-10-CM | POA: Insufficient documentation

## 2015-10-22 DIAGNOSIS — N390 Urinary tract infection, site not specified: Secondary | ICD-10-CM | POA: Insufficient documentation

## 2015-10-22 LAB — URINE MICROSCOPIC-ADD ON: Squamous Epithelial / LPF: NONE SEEN

## 2015-10-22 LAB — URINALYSIS, ROUTINE W REFLEX MICROSCOPIC
Bilirubin Urine: NEGATIVE
Glucose, UA: 100 mg/dL — AB
Ketones, ur: NEGATIVE mg/dL
Nitrite: NEGATIVE
Specific Gravity, Urine: 1.01 (ref 1.005–1.030)
pH: 7 (ref 5.0–8.0)

## 2015-10-22 MED ORDER — CEPHALEXIN 500 MG PO CAPS: 500.0000 mg | ORAL_CAPSULE | Freq: Four times a day (QID) | ORAL | Status: DC

## 2015-10-22 NOTE — Discharge Instructions (Signed)

## 2015-10-22 NOTE — ED Provider Notes (Signed)
CSN: [id number]     Arrival date & time 10/22/15  [id number] History   First MD Initiated Contact with Patient 10/22/15 [phone]     Chief Complaint  Patient presents with  . Flank Pain     (Consider location/radiation/quality/duration/timing/severity/associated sxs/prior Treatment) Patient is a 30 y.o. female presenting with flank pain. The history is provided by the patient. No language interpreter was used.  Flank Pain This is a new problem. The current episode started yesterday. The problem occurs constantly. The problem has been gradually worsening. Associated symptoms include fatigue. Pertinent negatives include no abdominal pain. Nothing aggravates the symptoms. She has tried nothing for the symptoms. The treatment provided no relief.  Pt complains of burning with urination and right flank pain.    Past Medical History  Diagnosis Date  . Asthma   . Anxiety   . Panic attacks   . Schizophrenia ([facility])   . Bipolar disorder ([facility])    History reviewed. No pertinent past surgical history. Family History  Problem Relation Age of Onset  . Mental illness Mother   . Mental illness Father    Social History  Substance Use Topics  . Smoking status: Current Every Day Smoker -- 0.50 packs/day    Types: Cigarettes  . Smokeless tobacco: None  . Alcohol Use: Yes   OB History    No data available     Review of Systems  Constitutional: Positive for fatigue.  Gastrointestinal: Negative for abdominal pain.  Genitourinary: Positive for flank pain.  All other systems reviewed and are negative.     Allergies  Review of patient's allergies indicates no known allergies.  Home Medications   Prior to Admission medications   Medication Sig Start Date End Date Taking? Authorizing Provider  albuterol (PROVENTIL HFA;VENTOLIN HFA) 108 (90 BASE) MCG/ACT inhaler Inhale 2 puffs into the lungs every 4 (four) hours as needed for wheezing or shortness of breath (and cough). 08/02/15   Shuvon B Rankin, NP   antiseptic oral rinse (BIOTENE) LIQD 15 mLs by Mouth Rinse route as needed (mouth wash). Patient taking differently: 15 mLs by Mouth Rinse route daily as needed for dry mouth (mouth wash).  08/02/15   Shuvon B Rankin, NP  ARIPiprazole (ABILIFY) 20 MG tablet Take 1 tablet (20 mg total) by mouth at bedtime. 08/02/15   Shuvon B Rankin, NP  ARIPiprazole 400 MG SUSR Inject 400 mg into the muscle every 30 (thirty) days. 08/02/15   Shuvon B Rankin, NP  busPIRone (BUSPAR) 5 MG tablet Take 5 mg by mouth 2 (two) times daily.    Historical Provider, MD  fluticasone (FLONASE) 50 MCG/ACT nasal spray Place 1 spray into both nostrils 2 (two) times daily as needed for allergies or rhinitis. 08/02/15   Shuvon B Rankin, NP  gabapentin (NEURONTIN) 100 MG capsule Take 2 capsules (200 mg total) by mouth 3 (three) times daily at 8am, 3pm and bedtime. 08/02/15   Shuvon B Rankin, NP  GLYCERIN ADULT 2 G suppository Place 1 suppository rectally once as needed for moderate constipation. 05/19/14   Marisa Severinlga Otter, MD  hydrOXYzine (VISTARIL) 25 MG capsule Take 25 mg by mouth 2 (two) times daily.    Historical Provider, MD  lamoTRIgine (LAMICTAL) 25 MG tablet Take 1 tablet (25 mg total) by mouth 2 (two) times daily. 08/02/15   Shuvon B Rankin, NP  naproxen sodium (ANAPROX) 220 MG tablet Take 220 mg by mouth 2 (two) times daily as needed (pain).    Historical Provider, MD  pantoprazole (  PROTONIX) 40 MG tablet Take 1 tablet (40 mg total) by mouth daily. 08/02/15   Shuvon B Rankin, NP  polyethylene glycol (MIRALAX / GLYCOLAX) packet Take 17 g by mouth daily. 05/19/14   Marisa Severin, MD  traZODone (DESYREL) 50 MG tablet Take 1 tablet (50 mg total) by mouth at bedtime as needed for sleep. 08/02/15   Shuvon B Rankin, NP   BP 141/94 mmHg  Pulse 100  Temp(Src) 98.3 F (36.8 C) (Oral)  Resp 16  Ht 5\' 8"  (1.727 m)  Wt 94.711 kg  BMI 31.76 kg/m2  SpO2 100%  LMP 10/08/2015 Physical Exam  Constitutional: She is oriented to person, place,  and time. She appears well-developed and well-nourished.  HENT:  Head: Normocephalic.  Eyes: EOM are normal.  Neck: Normal range of motion.  Cardiovascular: Normal rate and normal heart sounds.   Pulmonary/Chest: Effort normal and breath sounds normal.  Abdominal: She exhibits no distension.  Musculoskeletal: Normal range of motion.  Neurological: She is alert and oriented to person, place, and time.  Skin: Skin is warm.  Psychiatric: She has a normal mood and affect.  Nursing note and vitals reviewed.   ED Course  Procedures (including critical care time) Labs Review Labs Reviewed  URINALYSIS, ROUTINE W REFLEX MICROSCOPIC (NOT AT [facility] - Abnormal; Notable for the following:    Glucose, UA 100 (*)    Hgb urine dipstick TRACE (*)    Protein, ur TRACE (*)    Leukocytes, UA TRACE (*)    All other components within normal limits  URINE MICROSCOPIC-ADD ON - Abnormal; Notable for the following:    Bacteria, UA MANY (*)    All other components within normal limits    Imaging Review No results found. I have personally reviewed and evaluated these images and lab results as part of my medical decision-making.   EKG Interpretation None      MDM  Urine shows many bacteria,  Pt given rx for keflex Follow up with MD for repeat Ua in 1 week if symptoms persist   Final diagnoses:  UTI (lower urinary tract infection)   Keflex An After Visit Summary was printed and given to the patient.    Lonia Skinner [place], PA-C 10/22/15 1610  Raeford Razor, MD 10/31/15 [phone]

## 2015-10-22 NOTE — ED Notes (Signed)
Pt comes in with right side flank starting x1 week ago. Pt states she is having urinary frequency and tingling after going to the restroom.  In addition, pt states she has had a cough with nasal congestion for over a week.

## 2015-10-22 NOTE — ED Notes (Signed)
Pt made aware to return if symptoms worsen or if any life threatening symptoms occur.   

## 2015-12-25 ENCOUNTER — Encounter (HOSPITAL_COMMUNITY): Payer: Self-pay

## 2015-12-25 ENCOUNTER — Emergency Department (HOSPITAL_COMMUNITY)
Admission: EM | Admit: 2015-12-25 | Discharge: 2015-12-25 | Disposition: A | Discharge status: Home / Self Care | Payer: Self-pay | Attending: Emergency Medicine | Admitting: Emergency Medicine

## 2015-12-25 ENCOUNTER — Emergency Department (HOSPITAL_COMMUNITY): Payer: Self-pay

## 2015-12-25 DIAGNOSIS — H9213 Otorrhea, bilateral: Secondary | ICD-10-CM | POA: Insufficient documentation

## 2015-12-25 DIAGNOSIS — F209 Schizophrenia, unspecified: Secondary | ICD-10-CM | POA: Insufficient documentation

## 2015-12-25 DIAGNOSIS — R451 Restlessness and agitation: Secondary | ICD-10-CM | POA: Insufficient documentation

## 2015-12-25 DIAGNOSIS — F17210 Nicotine dependence, cigarettes, uncomplicated: Secondary | ICD-10-CM | POA: Insufficient documentation

## 2015-12-25 DIAGNOSIS — F410 Panic disorder [episodic paroxysmal anxiety] without agoraphobia: Secondary | ICD-10-CM | POA: Insufficient documentation

## 2015-12-25 DIAGNOSIS — Z79899 Other long term (current) drug therapy: Secondary | ICD-10-CM | POA: Insufficient documentation

## 2015-12-25 DIAGNOSIS — F319 Bipolar disorder, unspecified: Secondary | ICD-10-CM | POA: Insufficient documentation

## 2015-12-25 DIAGNOSIS — S0181XA Laceration without foreign body of other part of head, initial encounter: Secondary | ICD-10-CM

## 2015-12-25 DIAGNOSIS — Y9389 Activity, other specified: Secondary | ICD-10-CM | POA: Insufficient documentation

## 2015-12-25 DIAGNOSIS — H9203 Otalgia, bilateral: Secondary | ICD-10-CM | POA: Insufficient documentation

## 2015-12-25 DIAGNOSIS — S01511A Laceration without foreign body of lip, initial encounter: Secondary | ICD-10-CM | POA: Insufficient documentation

## 2015-12-25 DIAGNOSIS — S0990XD Unspecified injury of head, subsequent encounter: Secondary | ICD-10-CM | POA: Insufficient documentation

## 2015-12-25 DIAGNOSIS — S6991XA Unspecified injury of right wrist, hand and finger(s), initial encounter: Secondary | ICD-10-CM | POA: Insufficient documentation

## 2015-12-25 DIAGNOSIS — Y9289 Other specified places as the place of occurrence of the external cause: Secondary | ICD-10-CM | POA: Insufficient documentation

## 2015-12-25 DIAGNOSIS — Y998 Other external cause status: Secondary | ICD-10-CM | POA: Insufficient documentation

## 2015-12-25 DIAGNOSIS — Z792 Long term (current) use of antibiotics: Secondary | ICD-10-CM | POA: Insufficient documentation

## 2015-12-25 DIAGNOSIS — J45909 Unspecified asthma, uncomplicated: Secondary | ICD-10-CM | POA: Insufficient documentation

## 2015-12-25 MED ORDER — NAPROXEN 500 MG PO TABS: 500.0000 mg | ORAL_TABLET | Freq: Two times a day (BID) | ORAL | Status: DC

## 2015-12-25 MED ORDER — CIPROFLOXACIN-DEXAMETHASONE 0.3-0.1 % OT SUSP
3.0000 [drp] | Freq: Once | OTIC | Status: AC
  Administered 2015-12-25: 3 [drp] via OTIC
  Filled 2015-12-25: qty 7.5

## 2015-12-25 MED ORDER — PENICILLIN V POTASSIUM 500 MG PO TABS: 500.0000 mg | ORAL_TABLET | Freq: Four times a day (QID) | ORAL | Status: AC
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
## 2015-12-25 NOTE — ED Notes (Signed)
Pt requested a bed in her room . The chair was replaced with a stretcher and warm blankets
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
## 2015-12-25 NOTE — ED Notes (Signed)
Pt not in lobby when called to treatment area

## 2016-02-22 ENCOUNTER — Emergency Department (HOSPITAL_COMMUNITY): Payer: Self-pay

## 2016-02-22 ENCOUNTER — Inpatient Hospital Stay (HOSPITAL_COMMUNITY)
Admission: EM | Admit: 2016-02-22 | Discharge: 2016-02-26 | DRG: 872 | Disposition: A | Discharge status: Home / Self Care | Payer: Self-pay | Attending: Internal Medicine | Admitting: Internal Medicine

## 2016-02-22 ENCOUNTER — Encounter (HOSPITAL_COMMUNITY): Payer: Self-pay

## 2016-02-22 DIAGNOSIS — T1490XA Injury, unspecified, initial encounter: Secondary | ICD-10-CM

## 2016-02-22 DIAGNOSIS — Y904 Blood alcohol level of 80-99 mg/100 ml: Secondary | ICD-10-CM | POA: Diagnosis present

## 2016-02-22 DIAGNOSIS — F209 Schizophrenia, unspecified: Secondary | ICD-10-CM | POA: Diagnosis present

## 2016-02-22 DIAGNOSIS — Z818 Family history of other mental and behavioral disorders: Secondary | ICD-10-CM

## 2016-02-22 DIAGNOSIS — F419 Anxiety disorder, unspecified: Secondary | ICD-10-CM | POA: Diagnosis present

## 2016-02-22 DIAGNOSIS — F1020 Alcohol dependence, uncomplicated: Secondary | ICD-10-CM | POA: Diagnosis present

## 2016-02-22 DIAGNOSIS — A419 Sepsis, unspecified organism: Secondary | ICD-10-CM | POA: Diagnosis present

## 2016-02-22 DIAGNOSIS — Z6831 Body mass index (BMI) 31.0-31.9, adult: Secondary | ICD-10-CM

## 2016-02-22 DIAGNOSIS — Z590 Homelessness: Secondary | ICD-10-CM

## 2016-02-22 DIAGNOSIS — Z87440 Personal history of urinary (tract) infections: Secondary | ICD-10-CM

## 2016-02-22 DIAGNOSIS — G894 Chronic pain syndrome: Secondary | ICD-10-CM | POA: Diagnosis present

## 2016-02-22 DIAGNOSIS — F1290 Cannabis use, unspecified, uncomplicated: Secondary | ICD-10-CM | POA: Diagnosis present

## 2016-02-22 DIAGNOSIS — Z9114 Patient's other noncompliance with medication regimen: Secondary | ICD-10-CM

## 2016-02-22 DIAGNOSIS — N390 Urinary tract infection, site not specified: Secondary | ICD-10-CM | POA: Diagnosis present

## 2016-02-22 DIAGNOSIS — J45909 Unspecified asthma, uncomplicated: Secondary | ICD-10-CM | POA: Diagnosis present

## 2016-02-22 DIAGNOSIS — N12 Tubulo-interstitial nephritis, not specified as acute or chronic: Secondary | ICD-10-CM

## 2016-02-22 DIAGNOSIS — F1410 Cocaine abuse, uncomplicated: Secondary | ICD-10-CM | POA: Diagnosis present

## 2016-02-22 DIAGNOSIS — F17200 Nicotine dependence, unspecified, uncomplicated: Secondary | ICD-10-CM | POA: Diagnosis present

## 2016-02-22 DIAGNOSIS — A4151 Sepsis due to Escherichia coli [E. coli]: Principal | ICD-10-CM | POA: Diagnosis present

## 2016-02-22 DIAGNOSIS — F1910 Other psychoactive substance abuse, uncomplicated: Secondary | ICD-10-CM | POA: Diagnosis present

## 2016-02-22 DIAGNOSIS — F319 Bipolar disorder, unspecified: Secondary | ICD-10-CM | POA: Diagnosis present

## 2016-02-22 DIAGNOSIS — B9620 Unspecified Escherichia coli [E. coli] as the cause of diseases classified elsewhere: Secondary | ICD-10-CM | POA: Diagnosis present

## 2016-02-22 DIAGNOSIS — F17210 Nicotine dependence, cigarettes, uncomplicated: Secondary | ICD-10-CM | POA: Diagnosis present

## 2016-02-22 DIAGNOSIS — R51 Headache: Secondary | ICD-10-CM | POA: Diagnosis present

## 2016-02-22 LAB — COMPREHENSIVE METABOLIC PANEL
ALBUMIN: 4.1 g/dL (ref 3.5–5.0)
ALT: 40 U/L (ref 14–54)
AST: 45 U/L — AB (ref 15–41)
Alkaline Phosphatase: 79 U/L (ref 38–126)
Anion gap: 14 (ref 5–15)
BUN: 7 mg/dL (ref 6–20)
CHLORIDE: 96 mmol/L — AB (ref 101–111)
CO2: 23 mmol/L (ref 22–32)
Calcium: 8.7 mg/dL — ABNORMAL LOW (ref 8.9–10.3)
Creatinine, Ser: 0.74 mg/dL (ref 0.44–1.00)
GFR calc Af Amer: >60 mL/min (ref >60)
GFR calc non Af Amer: >60 mL/min (ref >60)
GLUCOSE: 95 mg/dL (ref 65–99)
POTASSIUM: 3.8 mmol/L (ref 3.5–5.1)
SODIUM: 133 mmol/L — AB (ref 135–145)
Total Bilirubin: 0.5 mg/dL (ref 0.3–1.2)
Total Protein: 7.8 g/dL (ref 6.5–8.1)

## 2016-02-22 LAB — CBC WITH DIFFERENTIAL/PLATELET
Basophils Absolute: 0 10*3/uL (ref 0.0–0.1)
Basophils Relative: 0 %
EOS PCT: 0 %
Eosinophils Absolute: 0 10*3/uL (ref 0.0–0.7)
HEMATOCRIT: 42.6 % (ref 36.0–46.0)
Hemoglobin: 14.6 g/dL (ref 12.0–15.0)
LYMPHS ABS: 0.6 10*3/uL — AB (ref 0.7–4.0)
LYMPHS PCT: 7 %
MCH: 30 pg (ref 26.0–34.0)
MCHC: 34.3 g/dL (ref 30.0–36.0)
MCV: 87.7 fL (ref 78.0–100.0)
MONO ABS: 0.8 10*3/uL (ref 0.1–1.0)
Monocytes Relative: 8 %
Neutro Abs: 7.7 10*3/uL (ref 1.7–7.7)
Neutrophils Relative %: 85 %
PLATELETS: 154 10*3/uL (ref 150–400)
RBC: 4.86 MIL/uL (ref 3.87–5.11)
RDW: 13.6 % (ref 11.5–15.5)
WBC: 9.1 10*3/uL (ref 4.0–10.5)

## 2016-02-22 LAB — SALICYLATE LEVEL

## 2016-02-22 LAB — URINALYSIS, ROUTINE W REFLEX MICROSCOPIC
Bilirubin Urine: NEGATIVE
GLUCOSE, UA: NEGATIVE mg/dL
Ketones, ur: 40 mg/dL — AB
Nitrite: POSITIVE — AB
PH: 5.5 (ref 5.0–8.0)
PROTEIN: 100 mg/dL — AB
Specific Gravity, Urine: 1.019 (ref 1.005–1.030)

## 2016-02-22 LAB — ETHANOL: Alcohol, Ethyl (B): 81 mg/dL — ABNORMAL HIGH (ref <5)

## 2016-02-22 LAB — RAPID URINE DRUG SCREEN, HOSP PERFORMED
Amphetamines: NOT DETECTED
BARBITURATES: NOT DETECTED
BENZODIAZEPINES: NOT DETECTED
Cocaine: POSITIVE — AB
Opiates: NOT DETECTED
TETRAHYDROCANNABINOL: POSITIVE — AB

## 2016-02-22 LAB — URINE MICROSCOPIC-ADD ON: Squamous Epithelial / LPF: NONE SEEN

## 2016-02-22 LAB — I-STAT CG4 LACTIC ACID, ED: LACTIC ACID, VENOUS: 2.63 mmol/L — AB (ref 0.5–2.0)

## 2016-02-22 LAB — I-STAT BETA HCG BLOOD, ED (MC, WL, AP ONLY)

## 2016-02-22 LAB — ACETAMINOPHEN LEVEL: Acetaminophen (Tylenol), Serum: <10 ug/mL — ABNORMAL LOW (ref 10–30)

## 2016-02-22 MED ORDER — DEXTROSE 5 % IV SOLN
1.0000 g | Freq: Once | INTRAVENOUS | Status: AC
  Administered 2016-02-22: 1 g via INTRAVENOUS
  Filled 2016-02-22: qty 10

## 2016-02-22 MED ORDER — SODIUM CHLORIDE 0.9 % IV BOLUS (SEPSIS)
1000.0000 mL | Freq: Once | INTRAVENOUS | Status: AC
  Administered 2016-02-22: 1000 mL via INTRAVENOUS

## 2016-02-22 MED ORDER — MORPHINE SULFATE (PF) 4 MG/ML IV SOLN
4.0000 mg | Freq: Once | INTRAVENOUS | Status: AC
  Administered 2016-02-22: 4 mg via INTRAVENOUS
  Filled 2016-02-22: qty 1

## 2016-02-22 MED ORDER — ACETAMINOPHEN 500 MG PO TABS
1000.0000 mg | ORAL_TABLET | Freq: Once | ORAL | Status: AC
  Administered 2016-02-22: 1000 mg via ORAL
  Filled 2016-02-22: qty 2

## 2016-02-22 NOTE — ED Notes (Signed)
Patient transported to X-ray 
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
## 2016-02-22 NOTE — ED Notes (Signed)
Pt states that she thinks she has  UTI and hasn't felt good since yesterday

## 2016-02-22 NOTE — ED Notes (Signed)
Pt has also been drinking tonight

## 2016-02-22 NOTE — ED Notes (Signed)
Below order not completed by EW. 
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
## 2016-02-23 ENCOUNTER — Encounter (HOSPITAL_COMMUNITY): Payer: Self-pay | Admitting: Internal Medicine

## 2016-02-23 DIAGNOSIS — N390 Urinary tract infection, site not specified: Secondary | ICD-10-CM

## 2016-02-23 DIAGNOSIS — A419 Sepsis, unspecified organism: Secondary | ICD-10-CM

## 2016-02-23 DIAGNOSIS — F17200 Nicotine dependence, unspecified, uncomplicated: Secondary | ICD-10-CM | POA: Diagnosis present

## 2016-02-23 DIAGNOSIS — J45909 Unspecified asthma, uncomplicated: Secondary | ICD-10-CM | POA: Diagnosis present

## 2016-02-23 LAB — COMPREHENSIVE METABOLIC PANEL
ALBUMIN: 3.1 g/dL — AB (ref 3.5–5.0)
ALK PHOS: 60 U/L (ref 38–126)
ALT: 37 U/L (ref 14–54)
AST: 43 U/L — AB (ref 15–41)
Anion gap: 7 (ref 5–15)
BILIRUBIN TOTAL: 1.1 mg/dL (ref 0.3–1.2)
BUN: 8 mg/dL (ref 6–20)
CO2: 23 mmol/L (ref 22–32)
Calcium: 8 mg/dL — ABNORMAL LOW (ref 8.9–10.3)
Chloride: 105 mmol/L (ref 101–111)
Creatinine, Ser: 0.66 mg/dL (ref 0.44–1.00)
GFR calc Af Amer: >60 mL/min (ref >60)
GFR calc non Af Amer: >60 mL/min (ref >60)
GLUCOSE: 140 mg/dL — AB (ref 65–99)
POTASSIUM: 3.8 mmol/L (ref 3.5–5.1)
Sodium: 135 mmol/L (ref 135–145)
TOTAL PROTEIN: 5.9 g/dL — AB (ref 6.5–8.1)

## 2016-02-23 LAB — CBC WITH DIFFERENTIAL/PLATELET
BASOS ABS: 0 10*3/uL (ref 0.0–0.1)
BASOS PCT: 0 %
Eosinophils Absolute: 0 10*3/uL (ref 0.0–0.7)
Eosinophils Relative: 0 %
HEMATOCRIT: 39.5 % (ref 36.0–46.0)
HEMOGLOBIN: 13.3 g/dL (ref 12.0–15.0)
Lymphocytes Relative: 5 %
Lymphs Abs: 0.6 10*3/uL — ABNORMAL LOW (ref 0.7–4.0)
MCH: 29.8 pg (ref 26.0–34.0)
MCHC: 33.7 g/dL (ref 30.0–36.0)
MCV: 88.4 fL (ref 78.0–100.0)
Monocytes Absolute: 0.8 10*3/uL (ref 0.1–1.0)
Monocytes Relative: 7 %
NEUTROS ABS: 9.3 10*3/uL — AB (ref 1.7–7.7)
NEUTROS PCT: 88 %
Platelets: 122 10*3/uL — ABNORMAL LOW (ref 150–400)
RBC: 4.47 MIL/uL (ref 3.87–5.11)
RDW: 13.6 % (ref 11.5–15.5)
WBC: 10.7 10*3/uL — ABNORMAL HIGH (ref 4.0–10.5)

## 2016-02-23 LAB — I-STAT CG4 LACTIC ACID, ED: LACTIC ACID, VENOUS: 1.83 mmol/L (ref 0.5–2.0)

## 2016-02-23 LAB — PREGNANCY, URINE: Preg Test, Ur: NEGATIVE

## 2016-02-23 LAB — LACTIC ACID, PLASMA: Lactic Acid, Venous: 1.7 mmol/L (ref 0.5–2.0)

## 2016-02-23 MED ORDER — IBUPROFEN 800 MG PO TABS
800.0000 mg | ORAL_TABLET | Freq: Once | ORAL | Status: AC
Start: 2016-02-23 — End: 2016-02-23
  Administered 2016-02-23: 800 mg via ORAL
  Filled 2016-02-23: qty 1

## 2016-02-23 MED ORDER — ONDANSETRON HCL 4 MG/2ML IJ SOLN: 4.0000 mg | Freq: Four times a day (QID) | INTRAMUSCULAR | Status: DC | PRN

## 2016-02-23 MED ORDER — LORAZEPAM 1 MG PO TABS: 1.0000 mg | ORAL_TABLET | Freq: Four times a day (QID) | ORAL | Status: DC | PRN

## 2016-02-23 MED ORDER — PANTOPRAZOLE SODIUM 40 MG IV SOLR
40.0000 mg | Freq: Once | INTRAVENOUS | Status: AC
  Administered 2016-02-23: 40 mg via INTRAVENOUS
  Filled 2016-02-23: qty 40

## 2016-02-23 MED ORDER — ONDANSETRON HCL 4 MG PO TABS: 4.0000 mg | ORAL_TABLET | Freq: Four times a day (QID) | ORAL | Status: DC | PRN

## 2016-02-23 MED ORDER — DEXTROSE 5 % IV SOLN
1.0000 g | INTRAVENOUS | Status: DC
  Filled 2016-02-23: qty 10

## 2016-02-23 MED ORDER — METRONIDAZOLE 500 MG PO TABS
2000.0000 mg | ORAL_TABLET | Freq: Once | ORAL | Status: AC
  Administered 2016-02-23: 2000 mg via ORAL
  Filled 2016-02-23: qty 4

## 2016-02-23 MED ORDER — VITAMIN B-1 100 MG PO TABS
100.0000 mg | ORAL_TABLET | Freq: Every day | ORAL | Status: DC
  Administered 2016-02-23 – 2016-02-26 (×4): 100 mg via ORAL
  Filled 2016-02-23 (×4): qty 1

## 2016-02-23 MED ORDER — PANTOPRAZOLE SODIUM 40 MG PO TBEC
40.0000 mg | DELAYED_RELEASE_TABLET | Freq: Every day | ORAL | Status: DC
  Administered 2016-02-24 – 2016-02-26 (×3): 40 mg via ORAL
  Filled 2016-02-23 (×3): qty 1

## 2016-02-23 MED ORDER — DEXTROSE 5 % IV SOLN
1.0000 g | Freq: Three times a day (TID) | INTRAVENOUS | Status: DC
  Administered 2016-02-23: 1 g via INTRAVENOUS
  Filled 2016-02-23 (×3): qty 1

## 2016-02-23 MED ORDER — AZITHROMYCIN 250 MG PO TABS
1000.0000 mg | ORAL_TABLET | Freq: Once | ORAL | Status: AC
  Administered 2016-02-23: 1000 mg via ORAL
  Filled 2016-02-23: qty 4

## 2016-02-23 MED ORDER — ADULT MULTIVITAMIN W/MINERALS CH
1.0000 | ORAL_TABLET | Freq: Every day | ORAL | Status: DC
  Administered 2016-02-23 – 2016-02-26 (×4): 1 via ORAL
  Filled 2016-02-23 (×4): qty 1

## 2016-02-23 MED ORDER — OXYCODONE HCL 5 MG PO TABS
5.0000 mg | ORAL_TABLET | Freq: Four times a day (QID) | ORAL | Status: DC | PRN
  Administered 2016-02-23 (×3): 5 mg via ORAL
  Filled 2016-02-23 (×3): qty 1

## 2016-02-23 MED ORDER — AZITHROMYCIN 250 MG PO TABS: 1000.0000 mg | ORAL_TABLET | Freq: Every day | ORAL | Status: DC

## 2016-02-23 MED ORDER — ACETAMINOPHEN 325 MG PO TABS
650.0000 mg | ORAL_TABLET | Freq: Four times a day (QID) | ORAL | Status: DC | PRN
  Administered 2016-02-23 – 2016-02-24 (×3): 650 mg via ORAL
  Filled 2016-02-23 (×3): qty 2

## 2016-02-23 MED ORDER — FOLIC ACID 1 MG PO TABS
1.0000 mg | ORAL_TABLET | Freq: Every day | ORAL | Status: DC
  Administered 2016-02-23 – 2016-02-26 (×4): 1 mg via ORAL
  Filled 2016-02-23 (×4): qty 1

## 2016-02-23 MED ORDER — MAGNESIUM SULFATE 2 GM/50ML IV SOLN
2.0000 g | Freq: Once | INTRAVENOUS | Status: AC
  Administered 2016-02-23: 2 g via INTRAVENOUS
  Filled 2016-02-23: qty 50

## 2016-02-23 MED ORDER — LORAZEPAM 2 MG/ML IJ SOLN
1.0000 mg | Freq: Four times a day (QID) | INTRAMUSCULAR | Status: DC | PRN
  Administered 2016-02-23 (×2): 1 mg via INTRAVENOUS
  Filled 2016-02-23 (×3): qty 1

## 2016-02-23 MED ORDER — KETOROLAC TROMETHAMINE 30 MG/ML IJ SOLN
30.0000 mg | Freq: Once | INTRAMUSCULAR | Status: AC
  Administered 2016-02-23: 30 mg via INTRAVENOUS
  Filled 2016-02-23: qty 1

## 2016-02-23 MED ORDER — LORAZEPAM 1 MG PO TABS
0.0000 mg | ORAL_TABLET | Freq: Four times a day (QID) | ORAL | Status: AC
  Administered 2016-02-23 – 2016-02-24 (×6): 2 mg via ORAL
  Filled 2016-02-23 (×7): qty 2

## 2016-02-23 MED ORDER — LORAZEPAM 1 MG PO TABS
0.0000 mg | ORAL_TABLET | Freq: Two times a day (BID) | ORAL | Status: DC
  Administered 2016-02-25: 1 mg via ORAL
  Filled 2016-02-23: qty 2

## 2016-02-23 MED ORDER — SODIUM CHLORIDE 0.9% FLUSH
3.0000 mL | Freq: Two times a day (BID) | INTRAVENOUS | Status: DC
  Administered 2016-02-23 – 2016-02-26 (×6): 3 mL via INTRAVENOUS

## 2016-02-23 MED ORDER — ENOXAPARIN SODIUM 40 MG/0.4ML ~~LOC~~ SOLN
40.0000 mg | SUBCUTANEOUS | Status: DC
  Administered 2016-02-24 – 2016-02-25 (×2): 40 mg via SUBCUTANEOUS
  Filled 2016-02-23 (×3): qty 0.4

## 2016-02-23 MED ORDER — THIAMINE HCL 100 MG/ML IJ SOLN: 100.0000 mg | Freq: Every day | INTRAMUSCULAR | Status: DC

## 2016-02-23 MED ORDER — POTASSIUM CHLORIDE IN NACL 20-0.9 MEQ/L-% IV SOLN
INTRAVENOUS | Status: DC
  Administered 2016-02-23: 03:00:00 via INTRAVENOUS
  Filled 2016-02-23 (×4): qty 1000
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
## 2016-02-24 DIAGNOSIS — R7881 Bacteremia: Secondary | ICD-10-CM

## 2016-02-24 DIAGNOSIS — F1020 Alcohol dependence, uncomplicated: Secondary | ICD-10-CM

## 2016-02-24 DIAGNOSIS — F1910 Other psychoactive substance abuse, uncomplicated: Secondary | ICD-10-CM

## 2016-02-24 LAB — BLOOD CULTURE ID PANEL (REFLEXED)
Acinetobacter baumannii: NOT DETECTED
CANDIDA GLABRATA: NOT DETECTED
CANDIDA KRUSEI: NOT DETECTED
CANDIDA TROPICALIS: NOT DETECTED
CARBAPENEM RESISTANCE: NOT DETECTED
Candida albicans: NOT DETECTED
Candida parapsilosis: NOT DETECTED
ESCHERICHIA COLI: DETECTED — AB
Enterobacter cloacae complex: NOT DETECTED
Enterobacteriaceae species: NOT DETECTED
Enterococcus species: NOT DETECTED
HAEMOPHILUS INFLUENZAE: NOT DETECTED
KLEBSIELLA OXYTOCA: NOT DETECTED
Klebsiella pneumoniae: NOT DETECTED
LISTERIA MONOCYTOGENES: NOT DETECTED
Methicillin resistance: NOT DETECTED
Neisseria meningitidis: NOT DETECTED
PROTEUS SPECIES: NOT DETECTED
Pseudomonas aeruginosa: NOT DETECTED
SERRATIA MARCESCENS: NOT DETECTED
STREPTOCOCCUS PYOGENES: NOT DETECTED
Staphylococcus aureus (BCID): NOT DETECTED
Staphylococcus species: NOT DETECTED
Streptococcus agalactiae: NOT DETECTED
Streptococcus pneumoniae: NOT DETECTED
Streptococcus species: NOT DETECTED
Vancomycin resistance: NOT DETECTED

## 2016-02-24 LAB — RPR: RPR: NONREACTIVE

## 2016-02-24 MED ORDER — GABAPENTIN 300 MG PO CAPS
300.0000 mg | ORAL_CAPSULE | Freq: Three times a day (TID) | ORAL | Status: DC
  Administered 2016-02-24 (×3): 300 mg via ORAL
  Filled 2016-02-24 (×3): qty 1

## 2016-02-24 MED ORDER — TRAZODONE HCL 50 MG PO TABS: 50.0000 mg | ORAL_TABLET | Freq: Every evening | ORAL | Status: DC | PRN

## 2016-02-24 MED ORDER — DEXTROSE 5 % IV SOLN
2.0000 g | INTRAVENOUS | Status: DC
  Administered 2016-02-24 – 2016-02-26 (×3): 2 g via INTRAVENOUS
  Filled 2016-02-24 (×3): qty 2

## 2016-02-24 MED ORDER — MORPHINE SULFATE (PF) 2 MG/ML IV SOLN
2.0000 mg | INTRAVENOUS | Status: DC | PRN
  Administered 2016-02-24 – 2016-02-25 (×4): 2 mg via INTRAVENOUS
  Filled 2016-02-24 (×4): qty 1

## 2016-02-24 MED ORDER — KETOROLAC TROMETHAMINE 30 MG/ML IJ SOLN: 30.0000 mg | Freq: Four times a day (QID) | INTRAMUSCULAR | Status: DC | PRN

## 2016-02-24 MED ORDER — OXYCODONE HCL 5 MG PO TABS
5.0000 mg | ORAL_TABLET | Freq: Four times a day (QID) | ORAL | Status: AC | PRN
  Administered 2016-02-24: 10 mg via ORAL
  Filled 2016-02-24: qty 2
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
## 2016-02-24 NOTE — Progress Notes (Signed)
PHARMACY - PHYSICIAN COMMUNICATION CRITICAL VALUE ALERT - BLOOD CULTURE IDENTIFICATION (BCID)  Results for orders placed or performed during the hospital encounter of 02/22/16  Blood Culture ID Panel (Reflexed) (Collected: 02/22/2016 10:25 PM)  Result Value Ref Range   Enterococcus species NOT DETECTED NOT DETECTED   Vancomycin resistance NOT DETECTED NOT DETECTED   Listeria monocytogenes NOT DETECTED NOT DETECTED   Staphylococcus species NOT DETECTED NOT DETECTED   Staphylococcus aureus NOT DETECTED NOT DETECTED   Methicillin resistance NOT DETECTED NOT DETECTED   Streptococcus species NOT DETECTED NOT DETECTED   Streptococcus agalactiae NOT DETECTED NOT DETECTED   Streptococcus pneumoniae NOT DETECTED NOT DETECTED   Streptococcus pyogenes NOT DETECTED NOT DETECTED   Acinetobacter baumannii NOT DETECTED NOT DETECTED   Enterobacteriaceae species NOT DETECTED NOT DETECTED   Enterobacter cloacae complex NOT DETECTED NOT DETECTED   Escherichia coli DETECTED (A) NOT DETECTED   Klebsiella oxytoca NOT DETECTED NOT DETECTED   Klebsiella pneumoniae NOT DETECTED NOT DETECTED   Proteus species NOT DETECTED NOT DETECTED   Serratia marcescens NOT DETECTED NOT DETECTED   Carbapenem resistance NOT DETECTED NOT DETECTED   Haemophilus influenzae NOT DETECTED NOT DETECTED   Neisseria meningitidis NOT DETECTED NOT DETECTED   Pseudomonas aeruginosa NOT DETECTED NOT DETECTED   Candida albicans NOT DETECTED NOT DETECTED   Candida glabrata NOT DETECTED NOT DETECTED   Candida krusei NOT DETECTED NOT DETECTED   Candida parapsilosis NOT DETECTED NOT DETECTED   Candida tropicalis NOT DETECTED NOT DETECTED   BCID result corrected this morning. Only E. coli detected and not Enterobacter cloacae per micro lab technician.  Name of physician (or Provider) Contacted: Dr. Jerral RalphGhimire  Changes to prescribed antibiotics required:  D/C cefepime Rocephin 2gm IV Q24h  Thanks, Dorethea ClanMary Ann Sanjay Broadfoot,  PharmD 02/24/2016    Dorethea ClanFrens, Ngina Royer Ann 02/24/2016  8:38 AM

## 2016-02-24 NOTE — Progress Notes (Signed)
Patient raised concerns over diet. Pt denied being allergic to chocolate and caffeine. Spoke to provider, will take allergies out of system.
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
## 2016-02-25 DIAGNOSIS — J45909 Unspecified asthma, uncomplicated: Secondary | ICD-10-CM

## 2016-02-25 LAB — CBC
HCT: 37.7 % (ref 36.0–46.0)
Hemoglobin: 12.6 g/dL (ref 12.0–15.0)
MCH: 29.6 pg (ref 26.0–34.0)
MCHC: 33.4 g/dL (ref 30.0–36.0)
MCV: 88.5 fL (ref 78.0–100.0)
PLATELETS: 150 10*3/uL (ref 150–400)
RBC: 4.26 MIL/uL (ref 3.87–5.11)
RDW: 13.3 % (ref 11.5–15.5)
WBC: 5 10*3/uL (ref 4.0–10.5)

## 2016-02-25 LAB — HIV ANTIBODY (ROUTINE TESTING W REFLEX): HIV Screen 4th Generation wRfx: NONREACTIVE

## 2016-02-25 LAB — URINE CULTURE: Culture: >=100000 — AB

## 2016-02-25 LAB — BASIC METABOLIC PANEL
ANION GAP: 8 (ref 5–15)
BUN: 7 mg/dL (ref 6–20)
CALCIUM: 8.7 mg/dL — AB (ref 8.9–10.3)
CO2: 25 mmol/L (ref 22–32)
Chloride: 102 mmol/L (ref 101–111)
Creatinine, Ser: 0.56 mg/dL (ref 0.44–1.00)
GFR calc Af Amer: >60 mL/min (ref >60)
Glucose, Bld: 110 mg/dL — ABNORMAL HIGH (ref 65–99)
POTASSIUM: 4 mmol/L (ref 3.5–5.1)
SODIUM: 135 mmol/L (ref 135–145)

## 2016-02-25 MED ORDER — GABAPENTIN 400 MG PO CAPS
400.0000 mg | ORAL_CAPSULE | Freq: Three times a day (TID) | ORAL | Status: DC
  Administered 2016-02-25 – 2016-02-26 (×3): 400 mg via ORAL
  Filled 2016-02-25 (×3): qty 1

## 2016-02-25 MED ORDER — OXYMETAZOLINE HCL 0.05 % NA SOLN
1.0000 | Freq: Two times a day (BID) | NASAL | Status: DC
  Administered 2016-02-25 – 2016-02-26 (×2): 1 via NASAL
  Filled 2016-02-25: qty 15

## 2016-02-25 MED ORDER — HYDROMORPHONE HCL 1 MG/ML IJ SOLN
1.0000 mg | INTRAMUSCULAR | Status: DC | PRN
  Administered 2016-02-25 – 2016-02-26 (×4): 1 mg via INTRAVENOUS
  Filled 2016-02-25 (×4): qty 1

## 2016-02-25 MED ORDER — DIPHENHYDRAMINE HCL 50 MG PO CAPS
50.0000 mg | ORAL_CAPSULE | Freq: Once | ORAL | Status: AC
  Administered 2016-02-25: 50 mg via ORAL
  Filled 2016-02-25: qty 1

## 2016-02-25 MED ORDER — OXYCODONE HCL 5 MG PO TABS
15.0000 mg | ORAL_TABLET | Freq: Four times a day (QID) | ORAL | Status: DC | PRN
  Administered 2016-02-25: 20 mg via ORAL
  Administered 2016-02-25: 15 mg via ORAL
  Administered 2016-02-25: 5 mg via ORAL
  Administered 2016-02-26 (×2): 20 mg via ORAL
  Filled 2016-02-25 (×2): qty 4
  Filled 2016-02-25 (×2): qty 3
  Filled 2016-02-25 (×2): qty 4

## 2016-02-25 MED ORDER — LORATADINE 10 MG PO TABS
10.0000 mg | ORAL_TABLET | Freq: Every day | ORAL | Status: DC
  Administered 2016-02-25 – 2016-02-26 (×2): 10 mg via ORAL
  Filled 2016-02-25 (×2): qty 1

## 2016-02-25 MED ORDER — NICOTINE 14 MG/24HR TD PT24
14.0000 mg | MEDICATED_PATCH | Freq: Every day | TRANSDERMAL | Status: DC
  Administered 2016-02-25 – 2016-02-26 (×2): 14 mg via TRANSDERMAL
  Filled 2016-02-25 (×2): qty 1

## 2016-02-25 MED ORDER — FLUTICASONE PROPIONATE 50 MCG/ACT NA SUSP
2.0000 | Freq: Every day | NASAL | Status: DC
  Administered 2016-02-26: 2 via NASAL
  Filled 2016-02-25: qty 16

## 2016-02-25 MED ORDER — DIPHENHYDRAMINE HCL 25 MG PO CAPS: 25.0000 mg | ORAL_CAPSULE | ORAL | Status: DC | PRN

## 2016-02-25 MED ORDER — OXYCODONE HCL 5 MG PO TABS: 15.0000 mg | ORAL_TABLET | Freq: Four times a day (QID) | ORAL | Status: DC | PRN
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
## 2016-02-26 DIAGNOSIS — F17200 Nicotine dependence, unspecified, uncomplicated: Secondary | ICD-10-CM

## 2016-02-26 LAB — CULTURE, BLOOD (ROUTINE X 2)

## 2016-02-26 MED ORDER — FLUTICASONE PROPIONATE 50 MCG/ACT NA SUSP: 1.0000 | Freq: Two times a day (BID) | NASAL | Status: AC | PRN

## 2016-02-26 MED ORDER — GABAPENTIN 400 MG PO CAPS: 400.0000 mg | ORAL_CAPSULE | Freq: Three times a day (TID) | ORAL | Status: AC

## 2016-02-26 MED ORDER — CEFUROXIME AXETIL 500 MG PO TABS
500.0000 mg | ORAL_TABLET | Freq: Two times a day (BID) | ORAL | Status: DC
  Filled 2016-02-26: qty 1

## 2016-02-26 MED ORDER — TRAZODONE HCL 50 MG PO TABS: 50.0000 mg | ORAL_TABLET | Freq: Every evening | ORAL | Status: AC | PRN

## 2016-02-26 MED ORDER — LORATADINE 10 MG PO TABS: 10.0000 mg | ORAL_TABLET | Freq: Every day | ORAL | Status: AC

## 2016-02-26 MED ORDER — CEFDINIR 300 MG PO CAPS: 300.0000 mg | ORAL_CAPSULE | Freq: Two times a day (BID) | ORAL | Status: AC

## 2016-02-26 MED ORDER — OXYMETAZOLINE HCL 0.05 % NA SOLN: 1.0000 | Freq: Two times a day (BID) | NASAL | Status: AC

## 2016-02-26 MED ORDER — ALBUTEROL SULFATE HFA 108 (90 BASE) MCG/ACT IN AERS: 2.0000 | INHALATION_SPRAY | RESPIRATORY_TRACT | Status: AC | PRN

## 2016-02-26 MED FILL — FLUTICASONE PROP 50 MCG SPR: 50 | 30 days supply | Qty: 16 | Fill #0

## 2016-02-26 MED FILL — GABAPENTIN 400 MG CAPSULE: 400 | 30 days supply | Qty: 90 | Fill #0

## 2016-02-26 MED FILL — ?CEFDINIR 300 MG CAPSULE: 300 MG | 7 days supply | Qty: 14 | Fill #0

## 2016-02-26 MED FILL — LORATADINE 10 MG TABLET: 10 | 30 days supply | Qty: 30 | Fill #0

## 2016-02-26 MED FILL — !PROVENTIL HFA 90 MCG INH: 108 (90 BAS | 30 days supply | Qty: 1 | Fill #0

## 2016-02-26 MED FILL — traZODone HCL 50 MG TABS: 50 | 30 days supply | Qty: 30 | Fill #0

## 2016-02-26 NOTE — Progress Notes (Signed)
[facility] W[facility] at 3:00 PM.  Pt can also pick up ABX free of charge. This information was shared with the pt and pt was encouraged to pick up medication and keep appointment.

## 2016-02-26 NOTE — Discharge Summary (Signed)
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 MD for:  persistant nausea and vomiting    Complete by:  As directed      Call MD for:  severe uncontrolled pain    Complete by:  As directed      Diet general    Complete by:  As directed      Increase activity slowly    Complete by:  As directed            Follow-up Information    Follow up with [facility] On 02/27/2016.   Why:  Appointment at 3:00 PM    Contact information:   [address] [place] [address] [phone]      Total Time spent on discharge equals  45 minutes.  SignedJeoffrey Massed 02/26/2016 11:24 AM

## 2016-02-27 ENCOUNTER — Inpatient Hospital Stay: Payer: Self-pay | Admitting: Internal Medicine

## 2016-02-28 LAB — CULTURE, BLOOD (ROUTINE X 2): Culture: NO GROWTH

## 2016-03-07 ENCOUNTER — Encounter (HOSPITAL_COMMUNITY): Payer: Self-pay | Admitting: Emergency Medicine

## 2016-03-07 ENCOUNTER — Emergency Department (HOSPITAL_COMMUNITY)
Admission: EM | Admit: 2016-03-07 | Discharge: 2016-03-08 | Disposition: A | Discharge status: Home / Self Care | Payer: Self-pay | Attending: Emergency Medicine | Admitting: Emergency Medicine

## 2016-03-07 DIAGNOSIS — F1910 Other psychoactive substance abuse, uncomplicated: Secondary | ICD-10-CM

## 2016-03-07 DIAGNOSIS — F1410 Cocaine abuse, uncomplicated: Secondary | ICD-10-CM | POA: Insufficient documentation

## 2016-03-07 DIAGNOSIS — F10920 Alcohol use, unspecified with intoxication, uncomplicated: Secondary | ICD-10-CM

## 2016-03-07 DIAGNOSIS — F1210 Cannabis abuse, uncomplicated: Secondary | ICD-10-CM | POA: Insufficient documentation

## 2016-03-07 DIAGNOSIS — J45909 Unspecified asthma, uncomplicated: Secondary | ICD-10-CM | POA: Insufficient documentation

## 2016-03-07 DIAGNOSIS — F10120 Alcohol abuse with intoxication, uncomplicated: Secondary | ICD-10-CM | POA: Insufficient documentation

## 2016-03-07 LAB — CBC WITH DIFFERENTIAL/PLATELET
BASOS ABS: 0.1 10*3/uL (ref 0.0–0.1)
BASOS PCT: 1 %
EOS ABS: 0.2 10*3/uL (ref 0.0–0.7)
EOS PCT: 3 %
HCT: 39.5 % (ref 36.0–46.0)
Hemoglobin: 13.7 g/dL (ref 12.0–15.0)
Lymphocytes Relative: 40 %
Lymphs Abs: 2.3 10*3/uL (ref 0.7–4.0)
MCH: 29.9 pg (ref 26.0–34.0)
MCHC: 34.7 g/dL (ref 30.0–36.0)
MCV: 86.2 fL (ref 78.0–100.0)
MONO ABS: 0.3 10*3/uL (ref 0.1–1.0)
Monocytes Relative: 5 %
Neutro Abs: 2.9 10*3/uL (ref 1.7–7.7)
Neutrophils Relative %: 51 %
PLATELETS: 351 10*3/uL (ref 150–400)
RBC: 4.58 MIL/uL (ref 3.87–5.11)
RDW: 13.7 % (ref 11.5–15.5)
WBC: 5.6 10*3/uL (ref 4.0–10.5)

## 2016-03-07 LAB — COMPREHENSIVE METABOLIC PANEL
ALK PHOS: 73 U/L (ref 38–126)
ALT: 24 U/L (ref 14–54)
ANION GAP: 7 (ref 5–15)
AST: 29 U/L (ref 15–41)
Albumin: 4.1 g/dL (ref 3.5–5.0)
BILIRUBIN TOTAL: 0.4 mg/dL (ref 0.3–1.2)
BUN: 6 mg/dL (ref 6–20)
CALCIUM: 8.1 mg/dL — AB (ref 8.9–10.3)
CO2: 23 mmol/L (ref 22–32)
Chloride: 107 mmol/L (ref 101–111)
Creatinine, Ser: 0.54 mg/dL (ref 0.44–1.00)
Glucose, Bld: 96 mg/dL (ref 65–99)
Potassium: 3.6 mmol/L (ref 3.5–5.1)
SODIUM: 137 mmol/L (ref 135–145)
TOTAL PROTEIN: 7.8 g/dL (ref 6.5–8.1)

## 2016-03-07 LAB — ETHANOL: ALCOHOL ETHYL (B): 300 mg/dL — AB (ref <5)

## 2016-03-07 LAB — I-STAT BETA HCG BLOOD, ED (MC, WL, AP ONLY)

## 2016-03-07 MED ORDER — SODIUM CHLORIDE 0.9 % IV BOLUS (SEPSIS)
1000.0000 mL | Freq: Once | INTRAVENOUS | Status: AC
  Administered 2016-03-08: 1000 mL via INTRAVENOUS
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
## 2016-03-07 NOTE — ED Notes (Signed)
Bed: RESA Expected date:  Expected time:  Means of arrival:  Comments: EMS ETOH / uncooperative
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
## 2016-03-08 LAB — RAPID URINE DRUG SCREEN, HOSP PERFORMED
AMPHETAMINES: NOT DETECTED
BARBITURATES: NOT DETECTED
BENZODIAZEPINES: NOT DETECTED
COCAINE: POSITIVE — AB
Opiates: NOT DETECTED
Tetrahydrocannabinol: POSITIVE — AB

## 2016-03-08 NOTE — Discharge Instructions (Signed)
Alcohol Intoxication Alcohol intoxication occurs when the amount of alcohol that a person has consumed impairs his or her ability to mentally and physically function. Alcohol directly impairs the normal chemical activity of the brain. Drinking large amounts of alcohol can lead to changes in mental function and behavior, and it can cause many physical effects that can be harmful.  Alcohol intoxication can range in severity from mild to very severe. Various factors can affect the level of intoxication that occurs, such as the person's age, gender, weight, frequency of alcohol consumption, and the presence of other medical conditions (such as diabetes, seizures, or heart conditions). Dangerous levels of alcohol intoxication may occur when people drink large amounts of alcohol in a short period (binge drinking). Alcohol can also be especially dangerous when combined with certain prescription medicines or "recreational" drugs. SIGNS AND SYMPTOMS Some common signs and symptoms of mild alcohol intoxication include:  Loss of coordination.  Changes in mood and behavior.  Impaired judgment.  Slurred speech. As alcohol intoxication progresses to more severe levels, other signs and symptoms will appear. These may include:  Vomiting.  Confusion and impaired memory.  Slowed breathing.  Seizures.  Loss of consciousness. DIAGNOSIS  Your health care provider will take a medical history and perform a physical exam. You will be asked about the amount and type of alcohol you have consumed. Blood tests will be done to measure the concentration of alcohol in your blood. In many places, your blood alcohol level must be lower than 80 mg/dL (4.13%0.08%) to legally drive. However, many dangerous effects of alcohol can occur at much lower levels.  TREATMENT  People with alcohol intoxication often do not require treatment. Most of the effects of alcohol intoxication are temporary, and they go away as the alcohol naturally  leaves the body. Your health care provider will monitor your condition until you are stable enough to go home. Fluids are sometimes given through an IV access tube to help prevent dehydration.  HOME CARE INSTRUCTIONS  Do not drive after drinking alcohol.  Stay hydrated. Drink enough water and fluids to keep your urine clear or pale yellow. Avoid caffeine.   Only take over-the-counter or prescription medicines as directed by your health care provider.  SEEK MEDICAL CARE IF:   You have persistent vomiting.   You do not feel better after a few days.  You have frequent alcohol intoxication. Your health care provider can help determine if you should see a substance use treatment counselor. SEEK IMMEDIATE MEDICAL CARE IF:   You become shaky or tremble when you try to stop drinking.   You shake uncontrollably (seizure).   You throw up (vomit) blood. This may be bright red or may look like black coffee grounds.   You have blood in your stool. This may be bright red or may appear as a black, tarry, bad smelling stool.   You become lightheaded or faint.  MAKE SURE YOU:   Understand these instructions.  Will watch your condition.  Will get help right away if you are not doing well or get worse.   This information is not intended to replace advice given to you by your health care provider. Make sure you discuss any questions you have with your health care provider.   Document Released: 07/02/2005 Document Revised: 05/25/2013 Document Reviewed: 02/25/2013 Elsevier Interactive Patient Education 2016 Elsevier Inc.  Chemical Dependency Chemical dependency is an addiction to drugs or alcohol. It is characterized by the repeated behavior of seeking out  and using drugs and alcohol despite harmful consequences to the health and safety of ones self and others.  RISK FACTORS There are certain situations or behaviors that increase a person's risk for chemical dependency. These  include:  A family history of chemical dependency.  A history of mental health issues, including depression and anxiety.  A home environment where drugs and alcohol are easily available to you.  Drug or alcohol use at a young age. SYMPTOMS  The following symptoms can indicate chemical dependency:  Inability to limit the use of drugs or alcohol.  Nausea, sweating, shakiness, and anxiety that occurs when alcohol or drugs are not being used.  An increase in amount of drugs or alcohol that is necessary to get drunk or high. People who experience these symptoms can assess their use of drugs and alcohol by asking themselves the following questions:  Have you been told by friends or family that they are worried about your use of alcohol or drugs?  Do friends and family ever tell you about things you did while drinking alcohol or using drugs that you do not remember?  Do you lie about using alcohol or drugs or about the amounts you use?  Do you have difficulty completing daily tasks unless you use alcohol or drugs?  Is the level of your work or school performance lower because of your drug or alcohol use?  Do you get sick from using drugs or alcohol but keep using anyway?  Do you feel uncomfortable in social situations unless you use alcohol or drugs?  Do you use drugs or alcohol to help forget problems? An answer of yes to any of these questions may indicate chemical dependency. Professional evaluation is suggested.   This information is not intended to replace advice given to you by your health care provider. Make sure you discuss any questions you have with your health care provider.   Document Released: 09/16/2001 Document Revised: 12/15/2011 Document Reviewed: 11/28/2010 Elsevier Interactive Patient Education [employer]

## 2016-03-10 ENCOUNTER — Encounter (HOSPITAL_COMMUNITY): Payer: Self-pay | Admitting: Internal Medicine

## 2016-04-05 ENCOUNTER — Emergency Department (HOSPITAL_COMMUNITY): Payer: Self-pay

## 2016-04-05 ENCOUNTER — Emergency Department (HOSPITAL_COMMUNITY)
Admission: EM | Admit: 2016-04-05 | Discharge: 2016-04-05 | Disposition: A | Discharge status: Home / Self Care | Payer: Self-pay | Attending: Emergency Medicine | Admitting: Emergency Medicine

## 2016-04-05 DIAGNOSIS — J45909 Unspecified asthma, uncomplicated: Secondary | ICD-10-CM | POA: Insufficient documentation

## 2016-04-05 DIAGNOSIS — F209 Schizophrenia, unspecified: Secondary | ICD-10-CM | POA: Insufficient documentation

## 2016-04-05 DIAGNOSIS — F10129 Alcohol abuse with intoxication, unspecified: Secondary | ICD-10-CM | POA: Insufficient documentation

## 2016-04-05 DIAGNOSIS — F1490 Cocaine use, unspecified, uncomplicated: Secondary | ICD-10-CM | POA: Insufficient documentation

## 2016-04-05 DIAGNOSIS — F1290 Cannabis use, unspecified, uncomplicated: Secondary | ICD-10-CM | POA: Insufficient documentation

## 2016-04-05 DIAGNOSIS — IMO0002 Reserved for concepts with insufficient information to code with codable children: Secondary | ICD-10-CM

## 2016-04-05 DIAGNOSIS — Z91040 Latex allergy status: Secondary | ICD-10-CM | POA: Insufficient documentation

## 2016-04-05 DIAGNOSIS — Z590 Homelessness: Secondary | ICD-10-CM | POA: Insufficient documentation

## 2016-04-05 DIAGNOSIS — F319 Bipolar disorder, unspecified: Secondary | ICD-10-CM | POA: Insufficient documentation

## 2016-04-05 DIAGNOSIS — Z79899 Other long term (current) drug therapy: Secondary | ICD-10-CM | POA: Insufficient documentation

## 2016-04-05 LAB — RAPID URINE DRUG SCREEN, HOSP PERFORMED
Amphetamines: NOT DETECTED
BARBITURATES: NOT DETECTED
BENZODIAZEPINES: POSITIVE — AB
Cocaine: POSITIVE — AB
Opiates: NOT DETECTED
Tetrahydrocannabinol: POSITIVE — AB

## 2016-04-05 LAB — CBC WITH DIFFERENTIAL/PLATELET
BASOS ABS: 0 10*3/uL (ref 0.0–0.1)
BASOS PCT: 1 %
EOS ABS: 0.2 10*3/uL (ref 0.0–0.7)
EOS PCT: 3 %
HEMATOCRIT: 40.7 % (ref 36.0–46.0)
Hemoglobin: 13.7 g/dL (ref 12.0–15.0)
Lymphocytes Relative: 33 %
Lymphs Abs: 2 10*3/uL (ref 0.7–4.0)
MCH: 30 pg (ref 26.0–34.0)
MCHC: 33.7 g/dL (ref 30.0–36.0)
MCV: 89.1 fL (ref 78.0–100.0)
MONO ABS: 0.4 10*3/uL (ref 0.1–1.0)
MONOS PCT: 7 %
Neutro Abs: 3.3 10*3/uL (ref 1.7–7.7)
Neutrophils Relative %: 56 %
PLATELETS: 225 10*3/uL (ref 150–400)
RBC: 4.57 MIL/uL (ref 3.87–5.11)
RDW: 15.2 % (ref 11.5–15.5)
WBC: 5.9 10*3/uL (ref 4.0–10.5)

## 2016-04-05 LAB — COMPREHENSIVE METABOLIC PANEL
ALBUMIN: 4.2 g/dL (ref 3.5–5.0)
ALK PHOS: 65 U/L (ref 38–126)
ALT: 27 U/L (ref 14–54)
ANION GAP: 10 (ref 5–15)
AST: 33 U/L (ref 15–41)
BILIRUBIN TOTAL: 0.3 mg/dL (ref 0.3–1.2)
BUN: 9 mg/dL (ref 6–20)
CALCIUM: 8.6 mg/dL — AB (ref 8.9–10.3)
CO2: 23 mmol/L (ref 22–32)
CREATININE: 0.5 mg/dL (ref 0.44–1.00)
Chloride: 106 mmol/L (ref 101–111)
GFR calc Af Amer: >60 mL/min (ref >60)
GFR calc non Af Amer: >60 mL/min (ref >60)
GLUCOSE: 80 mg/dL (ref 65–99)
Potassium: 3.7 mmol/L (ref 3.5–5.1)
SODIUM: 139 mmol/L (ref 135–145)
TOTAL PROTEIN: 7.5 g/dL (ref 6.5–8.1)

## 2016-04-05 LAB — I-STAT BETA HCG BLOOD, ED (MC, WL, AP ONLY): I-stat hCG, quantitative: <5 m[IU]/mL (ref <5)

## 2016-04-05 LAB — ETHANOL: ALCOHOL ETHYL (B): 66 mg/dL — AB (ref <5)

## 2016-04-05 NOTE — ED Notes (Signed)
Pt again refused vital signs

## 2016-04-05 NOTE — ED Notes (Signed)
Pt easily able to stand from wheelchair and get into bed.

## 2016-04-05 NOTE — ED Provider Notes (Signed)
CSN: [id number]     Arrival date & time 04/05/16  0430 History   First MD Initiated Contact with Patient 04/05/16 [phone]     Chief Complaint  Patient presents with  . Alcohol Intoxication  . Homeless   HPI Comments: 30 year old female presents with alcohol intoxication. History is provided by nursing staff as patient is intoxicated and incomprehensible. PMH significant for homelessness and polysubstance abuse. Apparently she was found sleeping in front of [employer] by [employer]. EMS [facility]. Pt does complaint of "pain all over"  Patient is a 30 y.o. female presenting with intoxication. The history is provided by medical records. The history is limited by the condition of the patient.  Alcohol Intoxication    Past Medical History  Diagnosis Date  . Asthma   . Anxiety   . Panic attacks   . Schizophrenia (HCC)   . Bipolar disorder (HCC)    No past surgical history on file. Family History  Problem Relation Age of Onset  . Mental illness Mother   . Mental illness Father    Social History  Substance Use Topics  . Smoking status: Not on file  . Smokeless tobacco: Not on file  . Alcohol Use: Yes   OB History    Gravida Para Term Preterm AB TAB SAB Ectopic Multiple Living   0 0 0 0 0 0 0 0       Review of Systems  Unable to perform ROS: Other    Allergies  Latex  Home Medications   Prior to Admission medications   Medication Sig Start Date End Date Taking? Authorizing Provider  albuterol (PROVENTIL HFA;VENTOLIN HFA) 108 (90 Base) MCG/ACT inhaler Inhale 2 puffs into the lungs every 4 (four) hours as needed for wheezing or shortness of breath (and cough). 02/26/16   Shanker Levora DredgeM Ghimire, MD  cefdinir (OMNICEF) 300 MG capsule Take 1 capsule (300 mg total) by mouth 2 (two) times daily. 02/26/16   Shanker Levora DredgeM Ghimire, MD  fluticasone (FLONASE) 50 MCG/ACT nasal spray Place 1 spray into both nostrils 2 (two) times daily as needed for allergies or rhinitis. 02/26/16    Shanker Levora DredgeM Ghimire, MD  gabapentin (NEURONTIN) 400 MG capsule Take 1 capsule (400 mg total) by mouth 3 (three) times daily. 02/26/16   Shanker Levora DredgeM Ghimire, MD  loratadine (CLARITIN) 10 MG tablet Take 1 tablet (10 mg total) by mouth daily. 02/26/16   Shanker Levora DredgeM Ghimire, MD  oxymetazoline (AFRIN) 0.05 % nasal spray Place 1 spray into both nostrils 2 (two) times daily. 02/26/16   Shanker Levora DredgeM Ghimire, MD  traZODone (DESYREL) 50 MG tablet Take 1 tablet (50 mg total) by mouth at bedtime as needed for sleep. 02/26/16   Shanker Levora DredgeM Ghimire, MD   BP 124/78 mmHg  Pulse 56  Temp(Src) 97.4 F (36.3 C) (Oral)  Resp 18  SpO2 100%   Physical Exam  Constitutional: No distress.  Sleeping but arousable  HENT:  Head: Normocephalic and atraumatic.  Eyes: Conjunctivae are normal. Pupils are equal, round, and reactive to light. Right eye exhibits no discharge. Left eye exhibits no discharge. No scleral icterus.  Neck: Normal range of motion.  Cardiovascular: Normal rate and regular rhythm.  Exam reveals no gallop.   No murmur heard. Pulmonary/Chest: Effort normal and breath sounds normal. No respiratory distress. She has no wheezes. She has no rales. She exhibits no tenderness.  Abdominal: Soft. She exhibits no distension. There is no tenderness.  Musculoskeletal:  Scabs over bilateral knees and ankles  Neurological: GCS eye subscore is 4. GCS verbal subscore is 4. GCS motor subscore is 5.  Arousable  Skin: Skin is warm and dry. She is not diaphoretic.  Scratch marks over entire body  Psychiatric: Her affect is angry. Her speech is slurred. She is agitated. She expresses impulsivity and inappropriate judgment.    ED Course  Procedures (including critical care time) Labs Review Labs Reviewed  COMPREHENSIVE METABOLIC PANEL - Abnormal; Notable for the following:    Calcium 8.6 (*)    All other components within normal limits  ETHANOL - Abnormal; Notable for the following:    Alcohol, Ethyl (B) 66 (*)    All  other components within normal limits  URINE RAPID DRUG SCREEN, HOSP PERFORMED - Abnormal; Notable for the following:    Cocaine POSITIVE (*)    Benzodiazepines POSITIVE (*)    Tetrahydrocannabinol POSITIVE (*)    All other components within normal limits  CBC WITH DIFFERENTIAL/PLATELET  I-STAT BETA HCG BLOOD, ED ([facility], [facility], [facility] ONLY)    Imaging Review Dg Ankle Complete Right  04/05/2016  CLINICAL DATA:  30 year old female with right ankle pain for the past 2 days. No known injury. EXAM: RIGHT ANKLE - COMPLETE 3+ VIEW COMPARISON:  None. FINDINGS: There is no evidence of fracture, dislocation, or joint effusion. There is no evidence of arthropathy or other focal bone abnormality. Soft tissues are unremarkable. IMPRESSION: Negative. Electronically Signed   By: Malachy MoanHeath  McCullough M.D.   On: 04/05/2016 11:17   Dg Foot Complete Right  04/05/2016  CLINICAL DATA:  Right foot pain EXAM: RIGHT FOOT COMPLETE - 3+ VIEW COMPARISON:  None. FINDINGS: There is no evidence of fracture or dislocation. There is no evidence of arthropathy or other focal bone abnormality. Soft tissues are unremarkable. IMPRESSION: Radiopaque density is noted in the soft tissues laterally similar to that seen on the prior ankle examination. No other focal abnormality is seen. Electronically Signed   By: Alcide CleverMark  Lukens M.D.   On: 04/05/2016 11:45   I have personally reviewed and evaluated these images and lab results as part of my medical decision-making.   EKG Interpretation None      MDM   Final diagnoses:  Intoxication   30 year old female who presents with intoxication. Alcohol level noted to be 66. All other labs are unremarkable. UDS remarkable for cocaine, benzos, THC. Preg test negative.   After labs have returned, asked nursing to walk patient. Pt refused saying she is in pain "all over" although mostly in the foot. Xrays are negative. When patient was given results patient states she is now "suicidal" and doesn't want to  leave. I don't believe this is the case however she states that she would like to talk to social work for resources for her homelessness. Social work consult placed.  Social work states that patient is refusing to answer questions or respond. Will discharge her. She's in NAD. I do not believe she is truly suicidal. She cannot state a specific plan. Patient is NAD, non-toxic, with stable VS.    Bethel BornKelly Marie Raynetta Osterloh, PA-C 04/05/16 1542  Melene Planan Floyd, DO 04/05/16 1544

## 2016-04-05 NOTE — ED Notes (Addendum)
Pt. Refused lab draw. RN, Raiford Nobleick made aware.

## 2016-04-05 NOTE — ED Notes (Signed)
Patient refused to ambulate 

## 2016-04-05 NOTE — ED Notes (Signed)
Per EMS- Pt was found sleeping in front of [employer]. Smells of ETOH, admits to drinking. Homeless.

## 2016-04-05 NOTE — ED Notes (Signed)
Pt refused vital signs.

## 2016-04-05 NOTE — Clinical Social Work Note (Signed)
Clinical Social Work Assessment  Patient Details  Name: [name] MRN: [id number] Date of Birth: 1986/02/16  Date of referral:                  Reason for consult:  Housing Concerns/Homelessness                Permission sought to share information with:    Permission granted to share information::  No  Name::        Agency::     Relationship::     Contact Information:     Housing/Transportation Living arrangements for the past 2 months:  Apartment (was living with boyfriend) Source of Information:  Patient Patient Interpreter Needed:    Criminal Activity/Legal Involvement Pertinent to Current Situation/Hospitalization:    Significant Relationships:  Parents (mother) Lives with:   (was living with boyfriend thinks he does not want her back) Do you feel safe going back to the place where you live?    Need for family participation in patient care:     Care giving concerns:  No caregiver   Social Worker assessment / plan:  CSW met with patient at bedside to assess for services.  CSW encouraged patient to discuss history and needs.  CSW provided referrals and resources regarding shelters, programs that work with the homeless, food pantries, act team and other services patient may access once discharge.   Employment status:  Financial risk analyst:   (none) PT Recommendations:  Not assessed at this time Information / Referral to community resources:     Patient/Family's Response to care:  Patient refused to wake up the first couple of times but eventually spoke to [place] although keeping her eyes closed most of the time.  Patient reported that she was living with her boyfriend and went to "party" somewhere last night but she does not remember where.  Patient reported that she drank alcohol, smoked crack and snorted heroin.  Patient could not provide how much she uses drugs or alcohol.  Patient concerned that her boyfriend will not want her back in the apartment because she  lost his lottery ticket that was worth 25,000.  Patient agitated to be asked questions.    Patient/Family's Understanding of and Emotional Response to Diagnosis, Current Treatment, and Prognosis:  Patient sleepy and agitated, not wanting to speak much.  Not sure how much patient understands about her diagnosis but unwilling to communicate further to gain any understanding.   Emotional Assessment Appearance:  Appears older than stated age Attitude/Demeanor/Rapport:  Lethargic, Guarded, Angry Affect (typically observed):  Agitated Orientation:  Oriented to Self, Oriented to Place, Oriented to  Time, Oriented to Situation Alcohol / Substance use:  Alcohol Use, Illicit Drugs (crack and heroin) Psych involvement (Current and /or in the community):  No (Comment)  Discharge Needs  Concerns to be addressed:  Homelessness Readmission within the last 30 days:  No Current discharge risk:    Barriers to Discharge:  No Barriers Identified   Carlean Jews, LCSW 04/05/2016, 3:57 PM

## 2016-04-24 ENCOUNTER — Encounter (HOSPITAL_COMMUNITY): Payer: Self-pay

## 2016-04-24 ENCOUNTER — Emergency Department (HOSPITAL_COMMUNITY)
Admission: EM | Admit: 2016-04-24 | Discharge: 2016-04-25 | Disposition: A | Discharge status: Home / Self Care | Payer: Self-pay | Attending: Emergency Medicine | Admitting: Emergency Medicine

## 2016-04-24 DIAGNOSIS — F319 Bipolar disorder, unspecified: Secondary | ICD-10-CM | POA: Insufficient documentation

## 2016-04-24 DIAGNOSIS — F1290 Cannabis use, unspecified, uncomplicated: Secondary | ICD-10-CM | POA: Insufficient documentation

## 2016-04-24 DIAGNOSIS — F10120 Alcohol abuse with intoxication, uncomplicated: Secondary | ICD-10-CM | POA: Insufficient documentation

## 2016-04-24 DIAGNOSIS — Z7951 Long term (current) use of inhaled steroids: Secondary | ICD-10-CM | POA: Insufficient documentation

## 2016-04-24 DIAGNOSIS — F10929 Alcohol use, unspecified with intoxication, unspecified: Secondary | ICD-10-CM

## 2016-04-24 DIAGNOSIS — Z792 Long term (current) use of antibiotics: Secondary | ICD-10-CM | POA: Insufficient documentation

## 2016-04-24 DIAGNOSIS — Z79899 Other long term (current) drug therapy: Secondary | ICD-10-CM | POA: Insufficient documentation

## 2016-04-24 DIAGNOSIS — J45909 Unspecified asthma, uncomplicated: Secondary | ICD-10-CM | POA: Insufficient documentation

## 2016-04-24 DIAGNOSIS — F209 Schizophrenia, unspecified: Secondary | ICD-10-CM | POA: Insufficient documentation

## 2016-04-24 LAB — ETHANOL: Alcohol, Ethyl (B): 382 mg/dL (ref <5)

## 2016-04-24 NOTE — ED Notes (Signed)
BP 132/82 HR 86 CBG 112 96% RA

## 2016-04-24 NOTE — ED Provider Notes (Signed)
CSN: [id number]     Arrival date & time 04/24/16  2043 History   First MD Initiated Contact with Patient 04/24/16 2049     Chief Complaint  Patient presents with  . Intoxicated     (Consider location/radiation/quality/duration/timing/severity/associated sxs/prior Treatment) HPI   30 year old female with history of schizophrenia, bipolar, anxiety and alcohol abuse here via EMS with complaints of intoxication. History is limited as patient is intoxicated and unable to answer any questions. History obtained through friend who is at bedside. Per friend, patient has consuming moderate amount of liquor throughout the day today with other people. She was spotted with an open container at the park by the police. The police gave her an ultimatum of going to jail for exhibiting intoxicated behaviors and having an open bottle or to go to the ER for further evaluation. She subsequently was brought here for further evaluation. Friend did not notice any other drug use.  Also no report of SI/HI.  Upon reviewing pt's prior ER visit, she was seen in the ED 19 days ago for similar complaint.  She was subsequently discharged when her EtOH level (66) was not significant elevated.  She was exhibiting malingering behaviors according to the evaluating provider.    Past Medical History  Diagnosis Date  . Asthma   . Anxiety   . Panic attacks   . Schizophrenia (HCC)   . Bipolar disorder (HCC)    No past surgical history on file. Family History  Problem Relation Age of Onset  . Mental illness Mother   . Mental illness Father    Social History  Substance Use Topics  . Smoking status: Not on file  . Smokeless tobacco: Not on file  . Alcohol Use: Yes   OB History    Gravida Para Term Preterm AB TAB SAB Ectopic Multiple Living   0 0 0 0 0 0 0 0       Review of Systems  Unable to perform ROS: Mental status change      Allergies  Latex  Home Medications   Prior to Admission medications   Medication  Sig Start Date End Date Taking? Authorizing Provider  albuterol (PROVENTIL HFA;VENTOLIN HFA) 108 (90 Base) MCG/ACT inhaler Inhale 2 puffs into the lungs every 4 (four) hours as needed for wheezing or shortness of breath (and cough). Patient not taking: Reported on 04/05/2016 02/26/16   Maretta Bees, MD  cefdinir (OMNICEF) 300 MG capsule Take 1 capsule (300 mg total) by mouth 2 (two) times daily. Patient not taking: Reported on 04/05/2016 02/26/16   Maretta Bees, MD  fluticasone [facility]) 50 MCG/ACT nasal spray Place 1 spray into both nostrils 2 (two) times daily as needed for allergies or rhinitis. Patient not taking: Reported on 04/05/2016 02/26/16   Maretta Bees, MD  gabapentin (NEURONTIN) 400 MG capsule Take 1 capsule (400 mg total) by mouth 3 (three) times daily. 02/26/16   Shanker Levora Dredge, MD  loratadine (CLARITIN) 10 MG tablet Take 1 tablet (10 mg total) by mouth daily. 02/26/16   Shanker Levora Dredge, MD  oxymetazoline (AFRIN) 0.05 % nasal spray Place 1 spray into both nostrils 2 (two) times daily. Patient not taking: Reported on 04/05/2016 02/26/16   Maretta Bees, MD  traZODone (DESYREL) 50 MG tablet Take 1 tablet (50 mg total) by mouth at bedtime as needed for sleep. 02/26/16   Shanker Levora Dredge, MD   LMP  (LMP Unknown) Physical Exam  Constitutional: She appears well-developed and well-nourished.  No distress.  Caucasian female, appears intoxicated, somnolent but arousable.   HENT:  Head: Atraumatic.  Eyes: Conjunctivae are normal. Pupils are equal, round, and reactive to light.  Neck: Normal range of motion. Neck supple.  Cardiovascular: Normal rate and regular rhythm.   Pulmonary/Chest: Effort normal and breath sounds normal.  Abdominal: Soft. There is no tenderness.  Neurological:  Somnolent, breath smell of alcohol.  arousable to painful stimuli.  Protecting her airway  Skin: No rash noted.  Psychiatric: She has a normal mood and affect.  Nursing note and vitals  reviewed.   ED Course  Procedures (including critical care time) Labs Review Labs Reviewed  ETHANOL - Abnormal; Notable for the following:    Alcohol, Ethyl (B) 382 (*)    All other components within normal limits    MDM   Final diagnoses:  None    BP 94/50 mmHg  Pulse 82  Temp(Src) 98.5 F (36.9 C) (Oral)  Resp 14  SpO2 96%  LMP  (LMP Unknown)   12:37 AM Pt here due to intoxication.  Pt does appear intoxicated, currently sleeping and protecting her airway.  EtOH 382.  Care discussed with oncoming provider who will dispo pt once she is clinically sober.  Care discussed with Dr. Roselyn BeringJ Knapp.    Fayrene HelperBowie Misbah Hornaday, PA-C 04/27/16 2228  Linwood DibblesJon Knapp, MD 04/29/16 [phone]

## 2016-04-24 NOTE — ED Notes (Signed)
Critical alcohol level called by lab-Lauren primary RN made aware

## 2016-04-24 NOTE — ED Notes (Signed)
[facility] Expected date:  Expected time:  Means of arrival:  Comments: EMS/intoxicated

## 2016-04-24 NOTE — ED Notes (Signed)
[facility].  Friend at bedside states she has been drinking "liquor"-states she arrived downtown intoxicated and continued to drink.

## 2016-04-25 NOTE — ED Provider Notes (Signed)
Patient signed out to me by Ardelle Parkran, PA-C.  Heavily intoxicated.   Sleeping in ED.  Reassess and discharge when sober.  PE: Gen: A&O x4 HEENT: PERRL, EOM intact CHEST: RRR, no m/r/g LUNGS: CTAB, no w/r/r ABD: BS x 4, ND/NT EXT: No edema, strong peripheral pulses NEURO: Sensation and strength intact bilaterally  5:06 AM Eating, drinking, will prepare for discharge.   Roxy Horsemanobert Ebin Palazzi, PA-C 04/25/16 0507  Melene Planan Floyd, DO 04/25/16 [phone]

## 2016-04-25 NOTE — Discharge Instructions (Signed)
Alcohol Intoxication  Alcohol intoxication occurs when you drink enough alcohol that it affects your ability to function. It can be mild or very severe. Drinking a lot of alcohol in a short time is called binge drinking. This can be very harmful. Drinking alcohol can also be more dangerous if you are taking medicines or other drugs. Some of the effects caused by alcohol may include:  · Loss of coordination.  · Changes in mood and behavior.  · Unclear thinking.  · Trouble talking (slurred speech).  · Throwing up (vomiting).  · Confusion.  · Slowed breathing.  · Twitching and shaking (seizures).  · Loss of consciousness.  HOME CARE  · Do not drive after drinking alcohol.  · Drink enough water and fluids to keep your pee (urine) clear or pale yellow. Avoid caffeine.  · Only take medicine as told by your doctor.  GET HELP IF:  · You throw up (vomit) many times.  · You do not feel better after a few days.  · You frequently have alcohol intoxication. Your doctor can help decide if you should see a substance use treatment counselor.  GET HELP RIGHT AWAY IF:  · You become shaky when you stop drinking.  · You have twitching and shaking.  · You throw up blood. It may look bright red or like coffee grounds.  · You notice blood in your poop (bowel movements).  · You become lightheaded or pass out (faint).  MAKE SURE YOU:   · Understand these instructions.  · Will watch your condition.  · Will get help right away if you are not doing well or get worse.     This information is not intended to replace advice given to you by your health care provider. Make sure you discuss any questions you have with your health care provider.     Document Released: 03/10/2008 Document Revised: 05/25/2013 Document Reviewed: 02/25/2013  Elsevier Interactive Patient Education ©2016 [employer]

## 2016-05-24 IMAGING — CR DG HUMERUS 2V *L*
3 series · 3 of 3 positions shown · non-contrast
Comparison: Film from the previous day

CLINICAL DATA: Status post reduction

EXAM:
LEFT HUMERUS - 2+ VIEW

[x humerus ap left (1 of 2)]
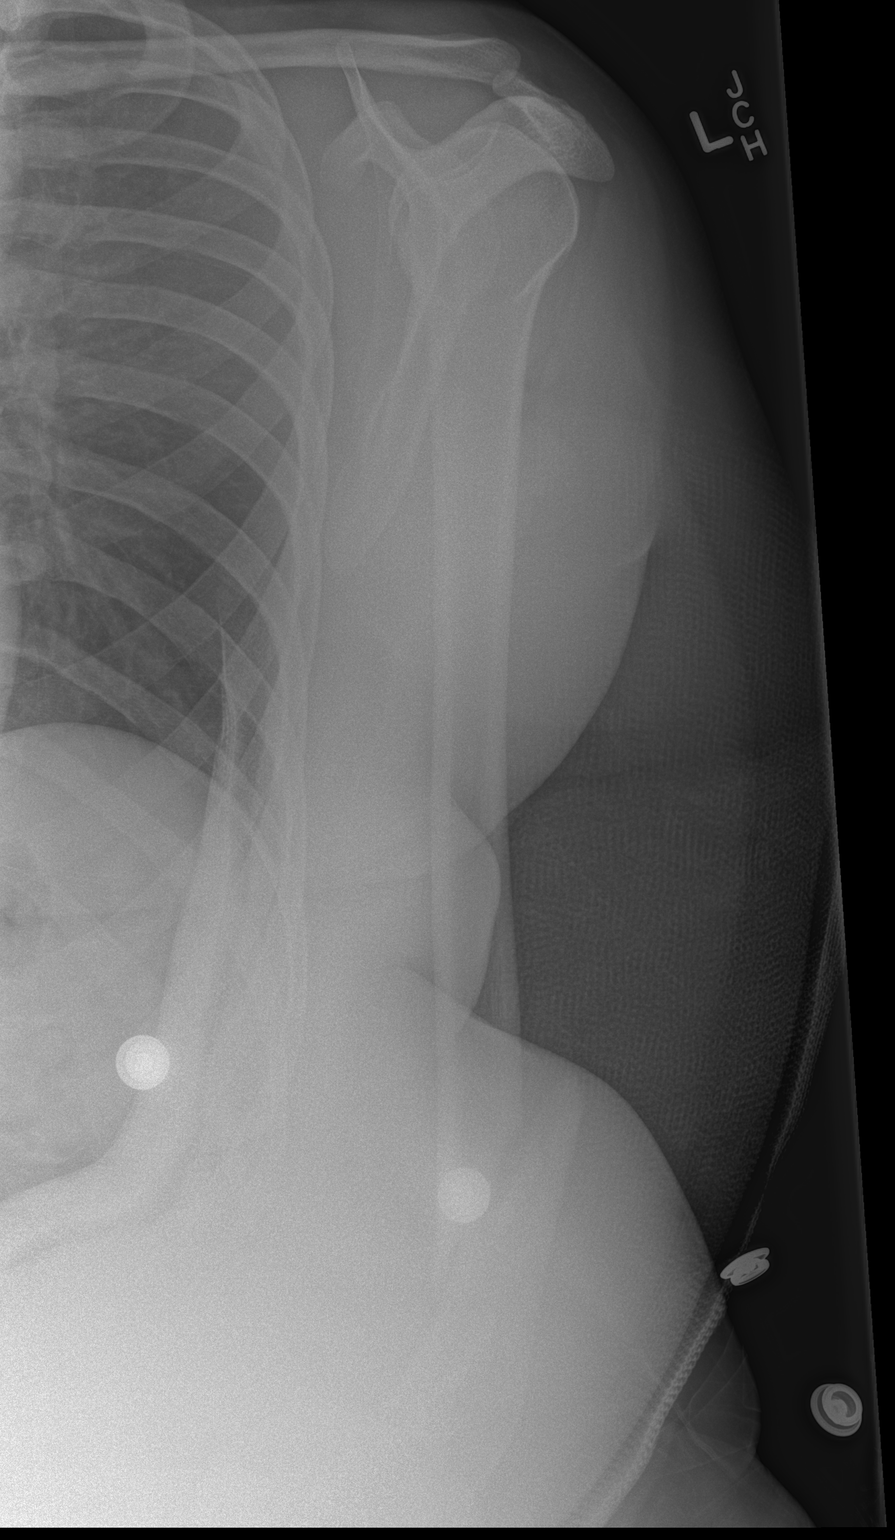

[x humerus lat left]
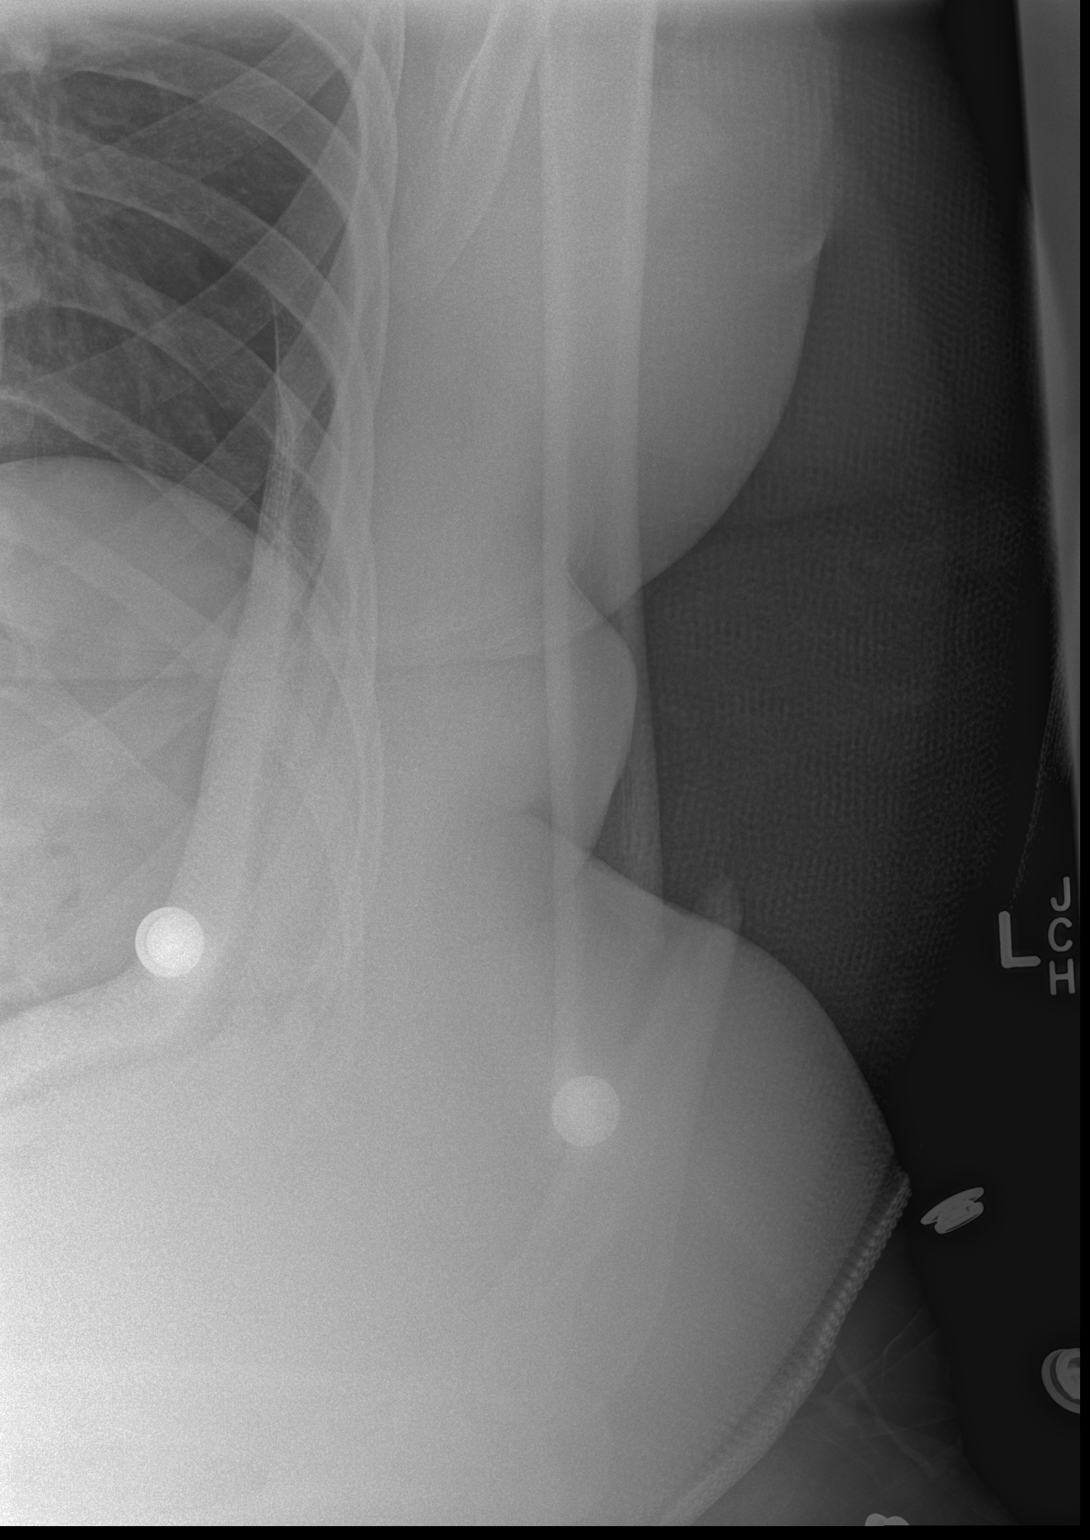

[x humerus ap left (2 of 2)]
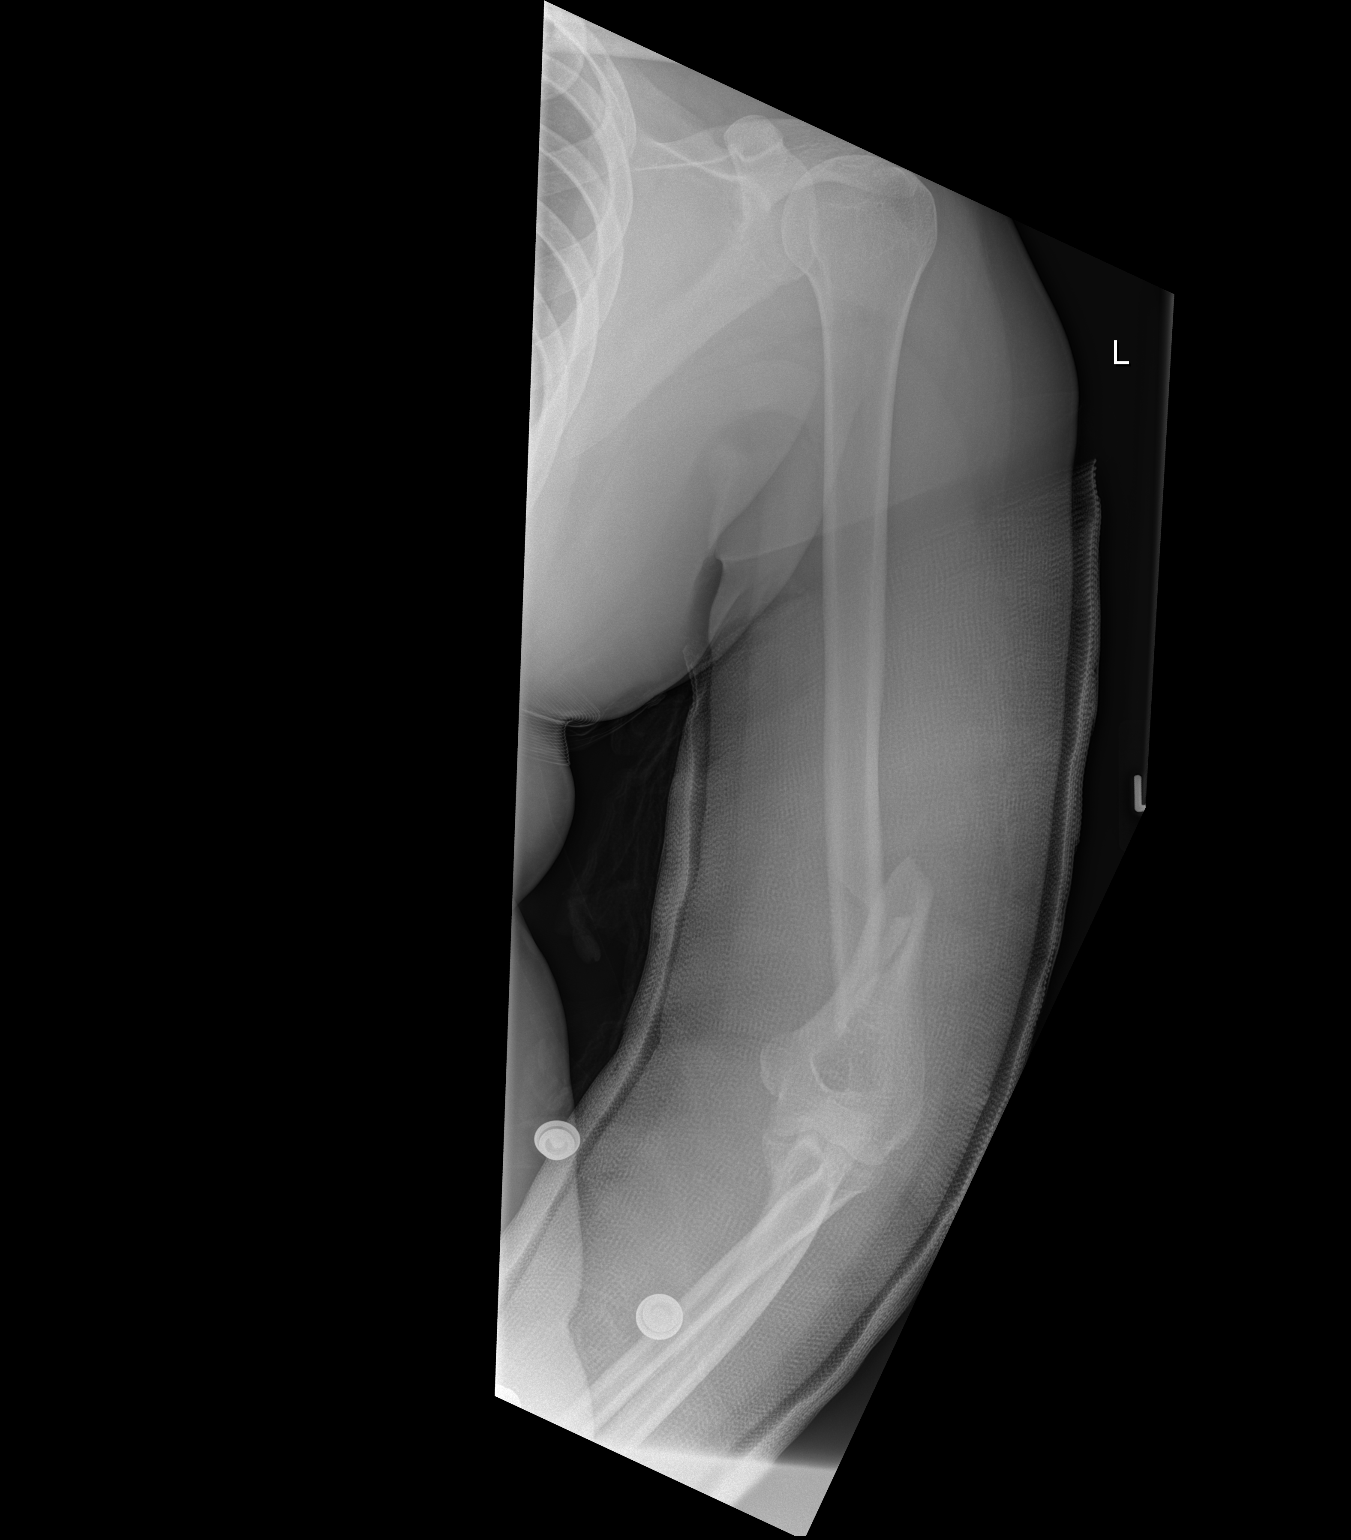

[3 of 3 positions shown; findings below may reference images not displayed]

FINDINGS: Splinting material is now seen. The distal left humeral fracture is
again identified. There remain some angulation at the fracture site
is well as some mild overlap at the fracture site. The overall
appearance is relatively stable from the prior exam.

## 2016-05-24 IMAGING — CR DG ELBOW 2V*L*
2 series · 2 of 2 positions shown · non-contrast
Comparison: April 11, 2014

CLINICAL DATA: Postreduction

EXAM:
LEFT ELBOW - 2 VIEW

[x elbow ap left]
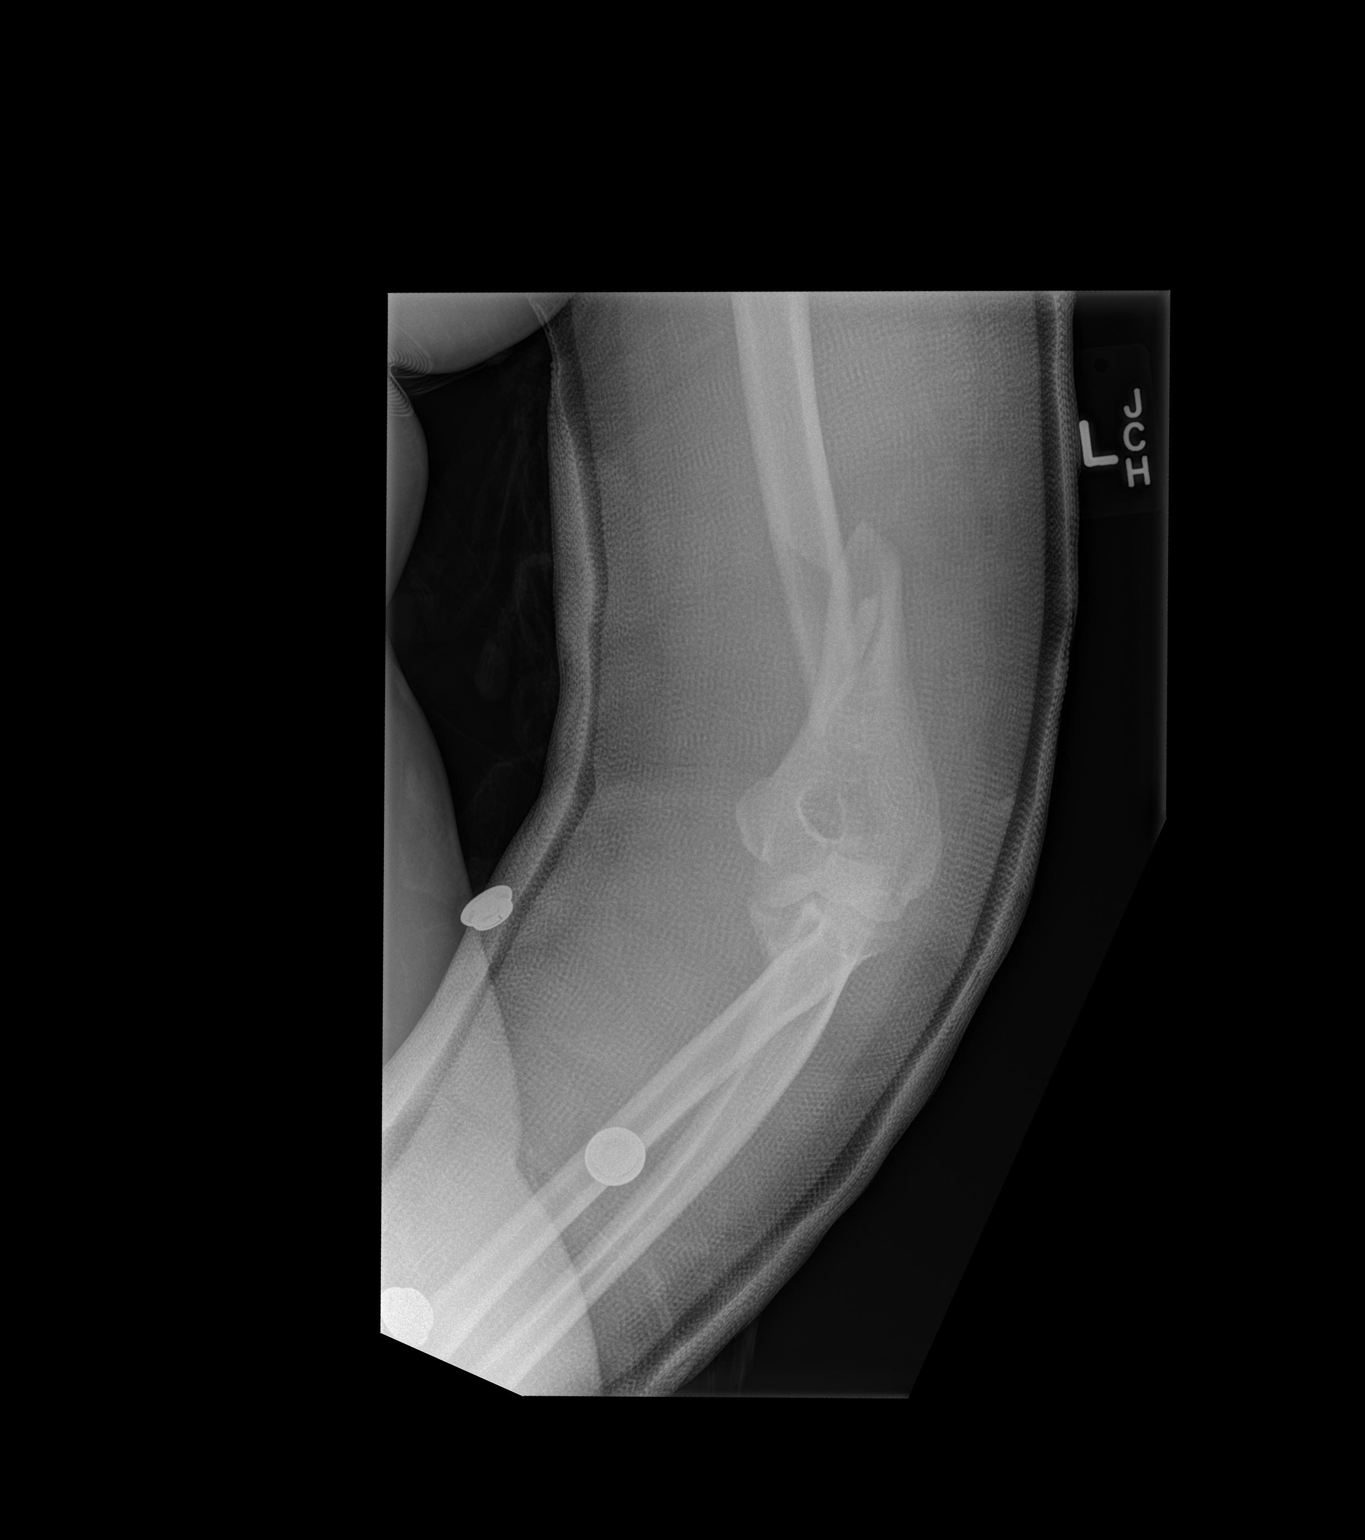

[x elbow obl left]
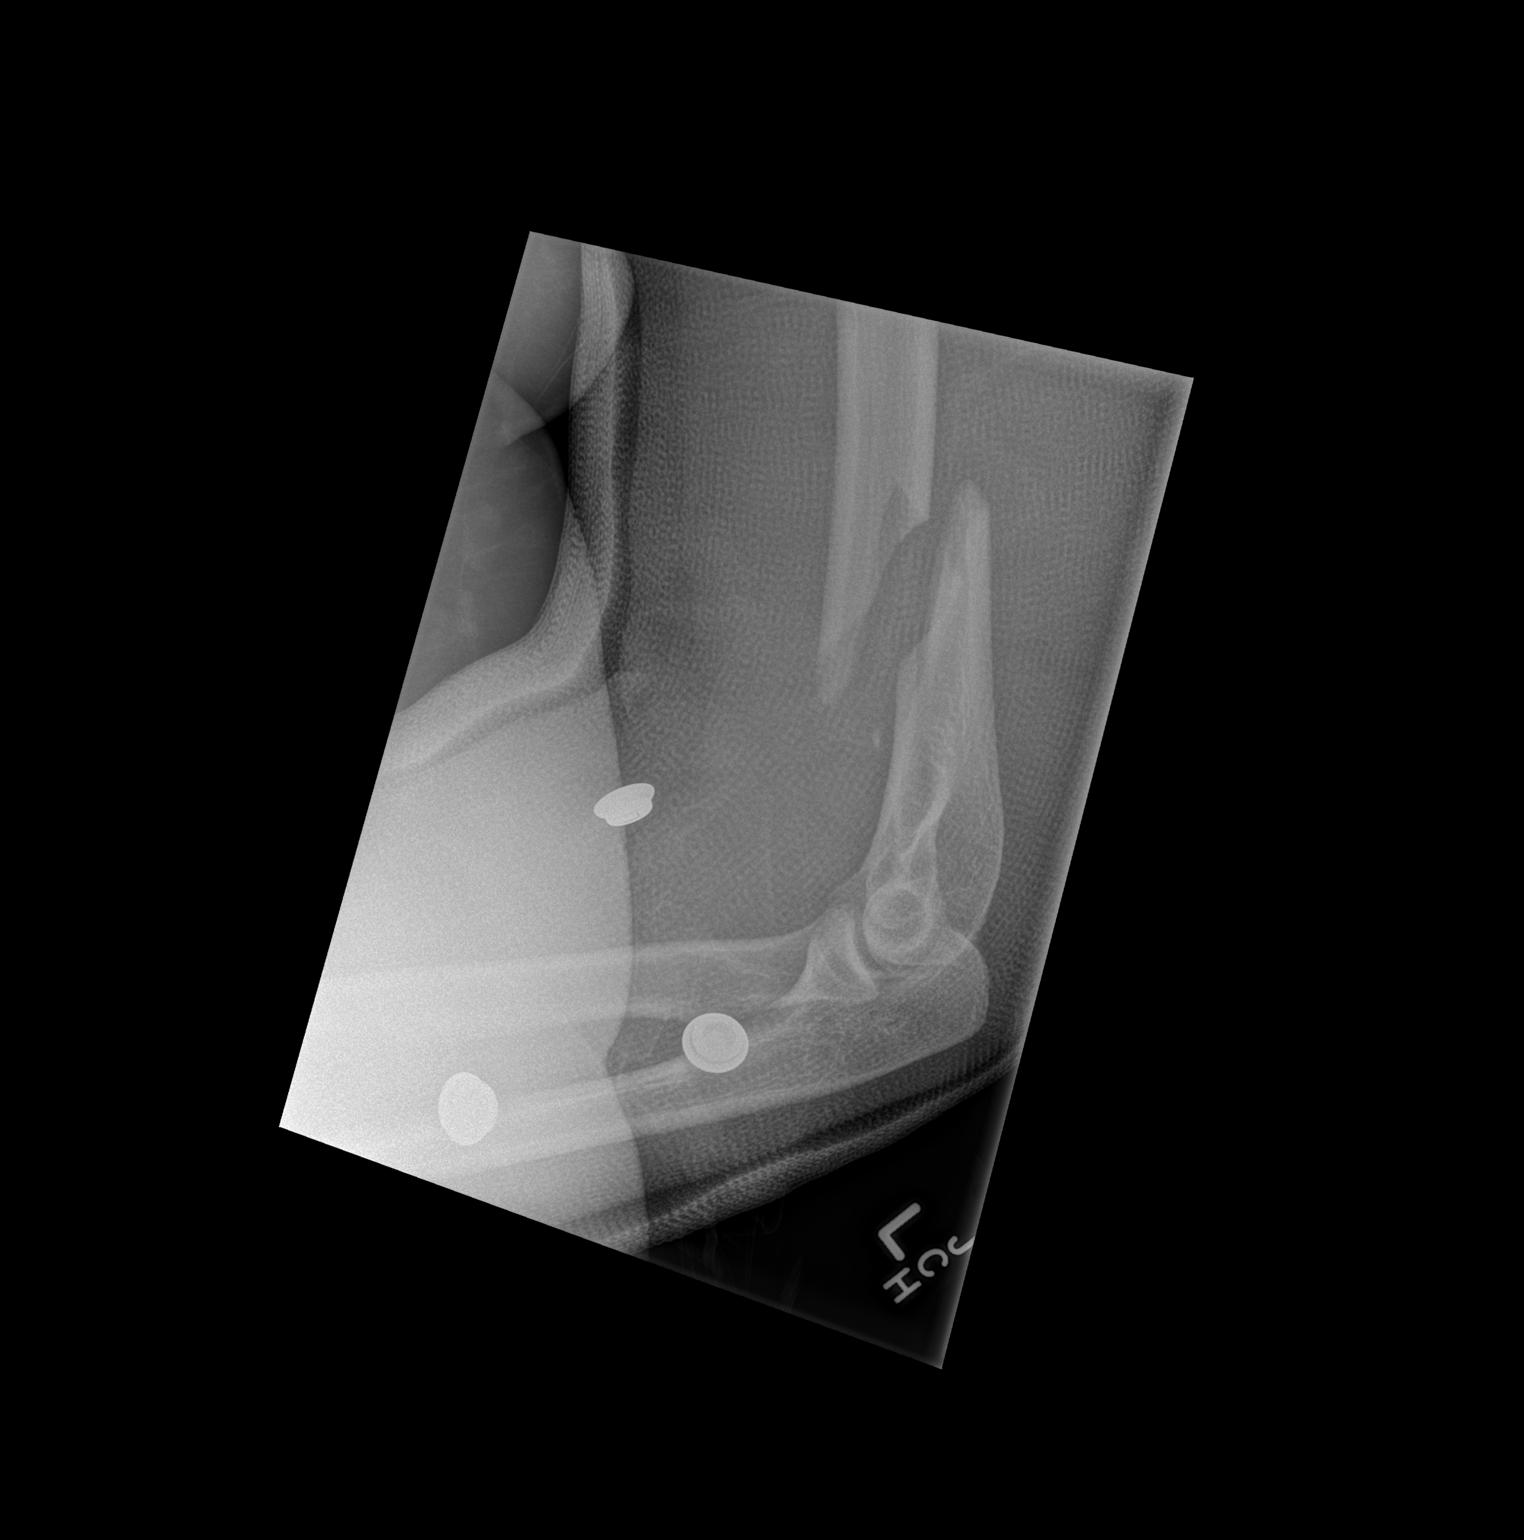

[2 of 2 positions shown; findings below may reference images not displayed]

FINDINGS: Frontal and lateral views were obtained. In plaster views again show
a fracture of the distal humeral diaphysis. There is posterior
displacement of the distal major fracture fragment with respect to
the major proximal fragment by a distance of 1.7 cm. There is also
volar angulation distally. There is no dislocation in the elbow
joint region. No other fractures are appreciable.
IMPRESSION: Fracture of the distal humeral diaphysis. In comparison with the
study obtained 1 day prior, there is now volar angulation distally.
There is perhaps slightly more displacement of fracture fragments
compared to the previous study. No dislocation appreciable.

## 2016-07-29 ENCOUNTER — Encounter (HOSPITAL_COMMUNITY): Payer: Self-pay | Admitting: Emergency Medicine

## 2016-07-29 ENCOUNTER — Emergency Department (HOSPITAL_COMMUNITY)
Admission: EM | Admit: 2016-07-29 | Discharge: 2016-07-29 | Disposition: A | Discharge status: Home / Self Care | Payer: Self-pay | Attending: Emergency Medicine | Admitting: Emergency Medicine

## 2016-07-29 DIAGNOSIS — Z91040 Latex allergy status: Secondary | ICD-10-CM | POA: Insufficient documentation

## 2016-07-29 DIAGNOSIS — F1410 Cocaine abuse, uncomplicated: Secondary | ICD-10-CM | POA: Insufficient documentation

## 2016-07-29 DIAGNOSIS — R451 Restlessness and agitation: Secondary | ICD-10-CM | POA: Insufficient documentation

## 2016-07-29 DIAGNOSIS — F1210 Cannabis abuse, uncomplicated: Secondary | ICD-10-CM | POA: Insufficient documentation

## 2016-07-29 DIAGNOSIS — F1910 Other psychoactive substance abuse, uncomplicated: Secondary | ICD-10-CM

## 2016-07-29 DIAGNOSIS — J45909 Unspecified asthma, uncomplicated: Secondary | ICD-10-CM | POA: Insufficient documentation

## 2016-07-29 DIAGNOSIS — Z79899 Other long term (current) drug therapy: Secondary | ICD-10-CM | POA: Insufficient documentation

## 2016-07-29 LAB — COMPREHENSIVE METABOLIC PANEL
ALBUMIN: 4.2 g/dL (ref 3.5–5.0)
ALT: 23 U/L (ref 14–54)
ANION GAP: 13 (ref 5–15)
AST: 35 U/L (ref 15–41)
Alkaline Phosphatase: 74 U/L (ref 38–126)
BUN: 7 mg/dL (ref 6–20)
CO2: 23 mmol/L (ref 22–32)
Calcium: 9 mg/dL (ref 8.9–10.3)
Chloride: 99 mmol/L — ABNORMAL LOW (ref 101–111)
Creatinine, Ser: 0.61 mg/dL (ref 0.44–1.00)
GFR calc Af Amer: >60 mL/min (ref >60)
GFR calc non Af Amer: >60 mL/min (ref >60)
GLUCOSE: 86 mg/dL (ref 65–99)
POTASSIUM: 4.1 mmol/L (ref 3.5–5.1)
SODIUM: 135 mmol/L (ref 135–145)
Total Bilirubin: 1.5 mg/dL — ABNORMAL HIGH (ref 0.3–1.2)
Total Protein: 7.8 g/dL (ref 6.5–8.1)

## 2016-07-29 LAB — RAPID URINE DRUG SCREEN, HOSP PERFORMED
Amphetamines: NOT DETECTED
BARBITURATES: NOT DETECTED
Benzodiazepines: NOT DETECTED
COCAINE: POSITIVE — AB
Opiates: NOT DETECTED
TETRAHYDROCANNABINOL: POSITIVE — AB

## 2016-07-29 LAB — URINALYSIS, ROUTINE W REFLEX MICROSCOPIC
Bilirubin Urine: NEGATIVE
GLUCOSE, UA: NEGATIVE mg/dL
HGB URINE DIPSTICK: NEGATIVE
Ketones, ur: 15 mg/dL — AB
Leukocytes, UA: NEGATIVE
Nitrite: NEGATIVE
PH: 7 (ref 5.0–8.0)
PROTEIN: NEGATIVE mg/dL
Specific Gravity, Urine: 1.017 (ref 1.005–1.030)

## 2016-07-29 LAB — CBC WITH DIFFERENTIAL/PLATELET
Basophils Absolute: 0 10*3/uL (ref 0.0–0.1)
Basophils Relative: 0 %
Eosinophils Absolute: 0 10*3/uL (ref 0.0–0.7)
Eosinophils Relative: 0 %
HEMATOCRIT: 39.1 % (ref 36.0–46.0)
HEMOGLOBIN: 13.6 g/dL (ref 12.0–15.0)
LYMPHS ABS: 1.3 10*3/uL (ref 0.7–4.0)
Lymphocytes Relative: 11 %
MCH: 30.8 pg (ref 26.0–34.0)
MCHC: 34.8 g/dL (ref 30.0–36.0)
MCV: 88.5 fL (ref 78.0–100.0)
MONOS PCT: 5 %
Monocytes Absolute: 0.6 10*3/uL (ref 0.1–1.0)
NEUTROS ABS: 10.3 10*3/uL — AB (ref 1.7–7.7)
NEUTROS PCT: 84 %
Platelets: 152 10*3/uL (ref 150–400)
RBC: 4.42 MIL/uL (ref 3.87–5.11)
RDW: 13.2 % (ref 11.5–15.5)
WBC: 12.2 10*3/uL — ABNORMAL HIGH (ref 4.0–10.5)

## 2016-07-29 LAB — ETHANOL: Alcohol, Ethyl (B): 25 mg/dL — ABNORMAL HIGH (ref <5)

## 2016-07-29 LAB — ACETAMINOPHEN LEVEL

## 2016-07-29 LAB — POC URINE PREG, ED: PREG TEST UR: NEGATIVE

## 2016-07-29 LAB — SALICYLATE LEVEL: Salicylate Lvl: <7 mg/dL (ref 2.8–30.0)

## 2016-07-29 MED ORDER — VITAMIN B-1 100 MG PO TABS: 100.0000 mg | ORAL_TABLET | Freq: Every day | ORAL | Status: DC

## 2016-07-29 MED ORDER — LORAZEPAM 1 MG PO TABS: 0.0000 mg | ORAL_TABLET | Freq: Four times a day (QID) | ORAL | Status: DC

## 2016-07-29 MED ORDER — THIAMINE HCL 100 MG/ML IJ SOLN: 100.0000 mg | Freq: Every day | INTRAMUSCULAR | Status: DC

## 2016-07-29 MED ORDER — LORAZEPAM 1 MG PO TABS: 0.0000 mg | ORAL_TABLET | Freq: Two times a day (BID) | ORAL | Status: DC

## 2016-07-29 NOTE — ED Notes (Signed)
Jorge NyLyndsey, RN of SANE contacted and states she will respond.
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
## 2016-07-29 NOTE — [facility] Counselor (Signed)
[facility] Assessment Progress Note  Writer attempted to complete assessment with pt. Pt refused to speak with Clinical research associatewriter. Writer attempted to engage pt with different questions. Finally, when writer asked pt if she had been drinking and if she was intoxicated, she said yes to both questions. Writer then asked pt if she wanted to complete assessment a little later when she was less intoxicated. Pt said yes and apologized.   Writer spoke to pt's RN, Amy, and advised of the situation. Pt has refused to participate with assessments with EDP and RN, also. Pt has not endorsed SI, HI, AVH, but she is on suicide precaution. Amy will call TTS back once pt is able to complete assessment.  Johny ShockSamantha M. Ladona Ridgelaylor, MS, NCC, LPCA Counselor

## 2016-07-29 NOTE — ED Notes (Signed)
Pt refused vitals 

## 2016-07-29 NOTE — ED Triage Notes (Signed)
Patient wouldn't answer most of my questions.   The only thing I got from her was "I need to be checked to see what kind of drugs I have in my system".   Patient woudn't advise if SI/HI.

## 2016-07-29 NOTE — ED Notes (Signed)
Consulting civil engineerCharge RN and staffing notified that we need a sitter.

## 2016-07-29 NOTE — ED Notes (Signed)
Went in to talk with the patient with Dr. Hyacinth MeekerMiller.   Patient stated she was NOT suicidal.   Patient was advised that we need blood.   Patient wouldn't talk with Lelon MastSamantha, [facility] via TTS.

## 2016-07-29 NOTE — ED Notes (Signed)
Pt discharged and information and resources given to pt. Pt states she does not want to leave but will give no information. Request pregnancy test and informed one done and negative.Pt states she cant walk then ambulates to door with steady gait. Drinking fluids and tolerating well. Pt leaves with [employer] and security.

## 2016-07-29 NOTE — ED Notes (Signed)
Patient refused to have blood taken.

## 2016-07-29 NOTE — ED Notes (Signed)
Did not search belongings or get into scrubs because patient refused and stated not suicidal.  Suicidal precaution removed

## 2016-07-29 NOTE — ED Notes (Signed)
Did ask Charge if he would talk with patient to help determine if suicidal or homicidal.   Patient has refused to get into scrubs.   Patient would not talk to charge RN either.

## 2016-07-29 NOTE — ED Notes (Signed)
Patient had asked registration clerk if someone would take her to the bathroom.   Patient then refused to walk to restroom.

## 2016-07-29 NOTE — ED Notes (Signed)
Pt very agitated when brought back to room. Pt thinks that everyone is talking about her.

## 2016-07-29 NOTE — ED Notes (Signed)
Patient refusing to get scrubs on or to give belongings.   Will reevaluate after test results back and decide if patient will be staying or going?

## 2016-07-29 NOTE — ED Notes (Signed)
Per SANE, RN, patient wouldn't answer any questions.  Patient is not cooperative at all.   Patient wouldn't answer registrations questions.

## 2016-07-29 NOTE — ED Notes (Signed)
SANE Nurse at bedside 

## 2016-07-29 NOTE — SANE Note (Signed)
I HAVE ATTEMPTED TO CONTACT JACK BOLICK [phone]), THE ACT TEAM MEMBER FOR THIS PT, SEVERAL TIMES SINCE 1030 HOURS (TODAY), AND I HAVE REACHED HIS VOICEMAIL EVERY TIME.  HOWEVER, HIS VOICEMAIL IS FULL, AND I AM UNABLE TO LEAVE HIM A MESSAGE.

## 2016-07-29 NOTE — Discharge Instructions (Signed)
I have offered the source you assistance in evaluation, you have refused, you may return if your symptoms worsen.

## 2016-07-29 NOTE — ED Notes (Signed)
Per Dr. Hyacinth MeekerMiller, okay to discontinue suicide precautions.  Will contact charge and staffing.
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
## 2016-07-29 NOTE — ED Notes (Signed)
Pt does not respond to questions and states Leave me alone. Respirations even and non labored. Skin warm and dry.
